# Patient Record
Sex: Female | Born: 1945
Health system: Southern US, Community
[De-identification: ages and names within clinical notes are randomized; demographics above are authoritative.]

## PROBLEM LIST (undated history)

## (undated) DIAGNOSIS — K219 Gastro-esophageal reflux disease without esophagitis: Secondary | ICD-10-CM

## (undated) DIAGNOSIS — N809 Endometriosis, unspecified: Secondary | ICD-10-CM

## (undated) DIAGNOSIS — I10 Essential (primary) hypertension: Secondary | ICD-10-CM

## (undated) DIAGNOSIS — K7689 Other specified diseases of liver: Secondary | ICD-10-CM

## (undated) DIAGNOSIS — K7460 Unspecified cirrhosis of liver: Secondary | ICD-10-CM

## (undated) DIAGNOSIS — I2510 Atherosclerotic heart disease of native coronary artery without angina pectoris: Secondary | ICD-10-CM

## (undated) DIAGNOSIS — R188 Other ascites: Secondary | ICD-10-CM

## (undated) DIAGNOSIS — K7030 Alcoholic cirrhosis of liver without ascites: Secondary | ICD-10-CM

## (undated) HISTORY — DX: Other ascites: R18.8

## (undated) HISTORY — PX: TONSILLECTOMY: SUR1361

## (undated) HISTORY — DX: Alcoholic cirrhosis of liver without ascites: K70.30

## (undated) HISTORY — PX: HEMORROIDECTOMY: SUR656

## (undated) HISTORY — PX: OTHER SURGICAL HISTORY: SHX169

## (undated) HISTORY — DX: Endometriosis, unspecified: N80.9

---

## 1987-04-08 HISTORY — PX: LAPAROSCOPY: SHX197

## 1987-04-08 HISTORY — PX: LAMINECTOMY: SHX219

## 1987-04-08 HISTORY — PX: OTHER SURGICAL HISTORY: SHX169

## 2004-07-23 DIAGNOSIS — E7800 Pure hypercholesterolemia, unspecified: Secondary | ICD-10-CM | POA: Insufficient documentation

## 2004-07-23 DIAGNOSIS — K769 Liver disease, unspecified: Secondary | ICD-10-CM | POA: Insufficient documentation

## 2004-07-23 DIAGNOSIS — F32A Depression, unspecified: Secondary | ICD-10-CM | POA: Insufficient documentation

## 2004-07-23 DIAGNOSIS — F329 Major depressive disorder, single episode, unspecified: Secondary | ICD-10-CM | POA: Insufficient documentation

## 2006-04-07 HISTORY — PX: HEMORROIDECTOMY: SUR656

## 2006-04-07 HISTORY — PX: CAROTID ENDARTERECTOMY: SUR193

## 2012-03-22 DIAGNOSIS — L719 Rosacea, unspecified: Secondary | ICD-10-CM | POA: Diagnosis not present

## 2012-04-08 DIAGNOSIS — Z130 Encounter for screening for diseases of the blood and blood-forming organs and certain disorders involving the immune mechanism: Secondary | ICD-10-CM | POA: Diagnosis not present

## 2012-04-08 DIAGNOSIS — Z136 Encounter for screening for cardiovascular disorders: Secondary | ICD-10-CM | POA: Diagnosis not present

## 2012-04-08 DIAGNOSIS — K2900 Acute gastritis without bleeding: Secondary | ICD-10-CM | POA: Diagnosis not present

## 2012-04-08 DIAGNOSIS — Z131 Encounter for screening for diabetes mellitus: Secondary | ICD-10-CM | POA: Diagnosis not present

## 2012-04-08 DIAGNOSIS — Z1329 Encounter for screening for other suspected endocrine disorder: Secondary | ICD-10-CM | POA: Diagnosis not present

## 2012-04-08 DIAGNOSIS — T887XXA Unspecified adverse effect of drug or medicament, initial encounter: Secondary | ICD-10-CM | POA: Diagnosis not present

## 2012-04-20 DIAGNOSIS — R7301 Impaired fasting glucose: Secondary | ICD-10-CM | POA: Diagnosis not present

## 2012-04-20 DIAGNOSIS — R7989 Other specified abnormal findings of blood chemistry: Secondary | ICD-10-CM | POA: Diagnosis not present

## 2012-04-20 DIAGNOSIS — R748 Abnormal levels of other serum enzymes: Secondary | ICD-10-CM | POA: Diagnosis not present

## 2012-04-20 DIAGNOSIS — R5381 Other malaise: Secondary | ICD-10-CM | POA: Diagnosis not present

## 2012-04-20 DIAGNOSIS — R5383 Other fatigue: Secondary | ICD-10-CM | POA: Diagnosis not present

## 2012-05-03 DIAGNOSIS — H25019 Cortical age-related cataract, unspecified eye: Secondary | ICD-10-CM | POA: Diagnosis not present

## 2012-12-02 DIAGNOSIS — Z23 Encounter for immunization: Secondary | ICD-10-CM | POA: Diagnosis not present

## 2012-12-14 DIAGNOSIS — D2370 Other benign neoplasm of skin of unspecified lower limb, including hip: Secondary | ICD-10-CM | POA: Diagnosis not present

## 2012-12-14 DIAGNOSIS — L98499 Non-pressure chronic ulcer of skin of other sites with unspecified severity: Secondary | ICD-10-CM | POA: Diagnosis not present

## 2012-12-21 DIAGNOSIS — L570 Actinic keratosis: Secondary | ICD-10-CM | POA: Diagnosis not present

## 2013-01-14 DIAGNOSIS — I700 Atherosclerosis of aorta: Secondary | ICD-10-CM | POA: Diagnosis not present

## 2013-01-14 DIAGNOSIS — R599 Enlarged lymph nodes, unspecified: Secondary | ICD-10-CM | POA: Diagnosis not present

## 2013-01-14 DIAGNOSIS — K7030 Alcoholic cirrhosis of liver without ascites: Secondary | ICD-10-CM | POA: Diagnosis not present

## 2013-01-14 DIAGNOSIS — N289 Disorder of kidney and ureter, unspecified: Secondary | ICD-10-CM | POA: Diagnosis not present

## 2013-01-14 DIAGNOSIS — K5710 Diverticulosis of small intestine without perforation or abscess without bleeding: Secondary | ICD-10-CM | POA: Diagnosis not present

## 2013-01-14 DIAGNOSIS — I998 Other disorder of circulatory system: Secondary | ICD-10-CM | POA: Diagnosis not present

## 2013-01-20 DIAGNOSIS — L570 Actinic keratosis: Secondary | ICD-10-CM | POA: Diagnosis not present

## 2013-03-09 DIAGNOSIS — L259 Unspecified contact dermatitis, unspecified cause: Secondary | ICD-10-CM | POA: Diagnosis not present

## 2013-03-09 DIAGNOSIS — L738 Other specified follicular disorders: Secondary | ICD-10-CM | POA: Diagnosis not present

## 2013-04-04 DIAGNOSIS — L259 Unspecified contact dermatitis, unspecified cause: Secondary | ICD-10-CM | POA: Diagnosis not present

## 2013-04-04 DIAGNOSIS — D1800 Hemangioma unspecified site: Secondary | ICD-10-CM | POA: Diagnosis not present

## 2013-06-29 DIAGNOSIS — H25019 Cortical age-related cataract, unspecified eye: Secondary | ICD-10-CM | POA: Diagnosis not present

## 2013-07-06 DIAGNOSIS — I781 Nevus, non-neoplastic: Secondary | ICD-10-CM | POA: Diagnosis not present

## 2013-07-06 DIAGNOSIS — L719 Rosacea, unspecified: Secondary | ICD-10-CM | POA: Diagnosis not present

## 2013-07-06 DIAGNOSIS — F430 Acute stress reaction: Secondary | ICD-10-CM | POA: Diagnosis not present

## 2013-08-04 DIAGNOSIS — Z9889 Other specified postprocedural states: Secondary | ICD-10-CM | POA: Diagnosis not present

## 2013-08-04 DIAGNOSIS — R002 Palpitations: Secondary | ICD-10-CM | POA: Diagnosis not present

## 2013-08-04 DIAGNOSIS — H2510 Age-related nuclear cataract, unspecified eye: Secondary | ICD-10-CM | POA: Diagnosis not present

## 2013-08-04 DIAGNOSIS — F17200 Nicotine dependence, unspecified, uncomplicated: Secondary | ICD-10-CM | POA: Diagnosis not present

## 2013-08-05 DIAGNOSIS — H2510 Age-related nuclear cataract, unspecified eye: Secondary | ICD-10-CM | POA: Diagnosis not present

## 2013-08-11 DIAGNOSIS — H25019 Cortical age-related cataract, unspecified eye: Secondary | ICD-10-CM | POA: Diagnosis not present

## 2013-08-11 DIAGNOSIS — H2510 Age-related nuclear cataract, unspecified eye: Secondary | ICD-10-CM | POA: Diagnosis not present

## 2013-08-11 DIAGNOSIS — R002 Palpitations: Secondary | ICD-10-CM | POA: Diagnosis not present

## 2013-08-11 DIAGNOSIS — H4320 Crystalline deposits in vitreous body, unspecified eye: Secondary | ICD-10-CM | POA: Diagnosis not present

## 2013-08-11 DIAGNOSIS — Z79899 Other long term (current) drug therapy: Secondary | ICD-10-CM | POA: Diagnosis not present

## 2013-08-11 DIAGNOSIS — Z83518 Family history of other specified eye disorder: Secondary | ICD-10-CM | POA: Diagnosis not present

## 2013-08-11 DIAGNOSIS — Z961 Presence of intraocular lens: Secondary | ICD-10-CM | POA: Diagnosis not present

## 2013-08-11 DIAGNOSIS — F17200 Nicotine dependence, unspecified, uncomplicated: Secondary | ICD-10-CM | POA: Diagnosis not present

## 2013-08-16 DIAGNOSIS — L719 Rosacea, unspecified: Secondary | ICD-10-CM | POA: Diagnosis not present

## 2013-08-23 DIAGNOSIS — K7460 Unspecified cirrhosis of liver: Secondary | ICD-10-CM | POA: Diagnosis not present

## 2013-08-26 DIAGNOSIS — K7460 Unspecified cirrhosis of liver: Secondary | ICD-10-CM | POA: Diagnosis not present

## 2013-08-26 DIAGNOSIS — K769 Liver disease, unspecified: Secondary | ICD-10-CM | POA: Diagnosis not present

## 2013-08-30 DIAGNOSIS — R002 Palpitations: Secondary | ICD-10-CM | POA: Diagnosis not present

## 2013-11-03 DIAGNOSIS — I059 Rheumatic mitral valve disease, unspecified: Secondary | ICD-10-CM | POA: Diagnosis not present

## 2013-11-03 DIAGNOSIS — R1110 Vomiting, unspecified: Secondary | ICD-10-CM | POA: Diagnosis not present

## 2013-11-03 DIAGNOSIS — E860 Dehydration: Secondary | ICD-10-CM | POA: Diagnosis not present

## 2013-11-03 DIAGNOSIS — Z888 Allergy status to other drugs, medicaments and biological substances status: Secondary | ICD-10-CM | POA: Diagnosis not present

## 2013-11-03 DIAGNOSIS — R197 Diarrhea, unspecified: Secondary | ICD-10-CM | POA: Diagnosis not present

## 2013-11-03 DIAGNOSIS — Z87891 Personal history of nicotine dependence: Secondary | ICD-10-CM | POA: Diagnosis not present

## 2013-11-30 DIAGNOSIS — K7460 Unspecified cirrhosis of liver: Secondary | ICD-10-CM | POA: Insufficient documentation

## 2013-11-30 DIAGNOSIS — K7030 Alcoholic cirrhosis of liver without ascites: Secondary | ICD-10-CM | POA: Diagnosis not present

## 2013-12-02 DIAGNOSIS — R197 Diarrhea, unspecified: Secondary | ICD-10-CM | POA: Diagnosis not present

## 2013-12-02 DIAGNOSIS — I10 Essential (primary) hypertension: Secondary | ICD-10-CM | POA: Diagnosis not present

## 2013-12-02 DIAGNOSIS — I8510 Secondary esophageal varices without bleeding: Secondary | ICD-10-CM | POA: Diagnosis not present

## 2013-12-02 DIAGNOSIS — J449 Chronic obstructive pulmonary disease, unspecified: Secondary | ICD-10-CM | POA: Diagnosis not present

## 2013-12-02 DIAGNOSIS — K449 Diaphragmatic hernia without obstruction or gangrene: Secondary | ICD-10-CM | POA: Diagnosis not present

## 2013-12-02 DIAGNOSIS — K7460 Unspecified cirrhosis of liver: Secondary | ICD-10-CM | POA: Diagnosis not present

## 2013-12-02 DIAGNOSIS — R5383 Other fatigue: Secondary | ICD-10-CM | POA: Diagnosis not present

## 2013-12-02 DIAGNOSIS — I498 Other specified cardiac arrhythmias: Secondary | ICD-10-CM | POA: Diagnosis not present

## 2013-12-02 DIAGNOSIS — K766 Portal hypertension: Secondary | ICD-10-CM | POA: Diagnosis not present

## 2013-12-02 DIAGNOSIS — R5381 Other malaise: Secondary | ICD-10-CM | POA: Diagnosis not present

## 2013-12-02 DIAGNOSIS — K319 Disease of stomach and duodenum, unspecified: Secondary | ICD-10-CM | POA: Diagnosis not present

## 2013-12-02 DIAGNOSIS — R110 Nausea: Secondary | ICD-10-CM | POA: Diagnosis not present

## 2013-12-02 DIAGNOSIS — K2100 Gastro-esophageal reflux disease with esophagitis, without bleeding: Secondary | ICD-10-CM | POA: Diagnosis not present

## 2013-12-02 DIAGNOSIS — R188 Other ascites: Secondary | ICD-10-CM | POA: Diagnosis not present

## 2013-12-02 DIAGNOSIS — K7030 Alcoholic cirrhosis of liver without ascites: Secondary | ICD-10-CM | POA: Diagnosis not present

## 2013-12-02 DIAGNOSIS — K921 Melena: Secondary | ICD-10-CM | POA: Diagnosis not present

## 2013-12-02 DIAGNOSIS — K828 Other specified diseases of gallbladder: Secondary | ICD-10-CM | POA: Diagnosis not present

## 2013-12-02 DIAGNOSIS — E871 Hypo-osmolality and hyponatremia: Secondary | ICD-10-CM | POA: Diagnosis not present

## 2013-12-02 DIAGNOSIS — Z66 Do not resuscitate: Secondary | ICD-10-CM | POA: Diagnosis not present

## 2013-12-02 DIAGNOSIS — F17200 Nicotine dependence, unspecified, uncomplicated: Secondary | ICD-10-CM | POA: Diagnosis not present

## 2013-12-03 DIAGNOSIS — K766 Portal hypertension: Secondary | ICD-10-CM | POA: Diagnosis not present

## 2013-12-03 DIAGNOSIS — K2100 Gastro-esophageal reflux disease with esophagitis, without bleeding: Secondary | ICD-10-CM | POA: Diagnosis not present

## 2013-12-03 DIAGNOSIS — I8510 Secondary esophageal varices without bleeding: Secondary | ICD-10-CM | POA: Diagnosis not present

## 2013-12-03 DIAGNOSIS — K449 Diaphragmatic hernia without obstruction or gangrene: Secondary | ICD-10-CM | POA: Diagnosis not present

## 2013-12-03 DIAGNOSIS — K7030 Alcoholic cirrhosis of liver without ascites: Secondary | ICD-10-CM | POA: Diagnosis not present

## 2013-12-03 DIAGNOSIS — K921 Melena: Secondary | ICD-10-CM | POA: Diagnosis not present

## 2013-12-03 DIAGNOSIS — K319 Disease of stomach and duodenum, unspecified: Secondary | ICD-10-CM | POA: Diagnosis not present

## 2013-12-03 DIAGNOSIS — Z66 Do not resuscitate: Secondary | ICD-10-CM | POA: Diagnosis not present

## 2013-12-04 DIAGNOSIS — K449 Diaphragmatic hernia without obstruction or gangrene: Secondary | ICD-10-CM | POA: Diagnosis not present

## 2013-12-04 DIAGNOSIS — K921 Melena: Secondary | ICD-10-CM | POA: Diagnosis not present

## 2013-12-04 DIAGNOSIS — K7030 Alcoholic cirrhosis of liver without ascites: Secondary | ICD-10-CM | POA: Diagnosis not present

## 2013-12-04 DIAGNOSIS — K2100 Gastro-esophageal reflux disease with esophagitis, without bleeding: Secondary | ICD-10-CM | POA: Diagnosis not present

## 2013-12-04 DIAGNOSIS — I8511 Secondary esophageal varices with bleeding: Secondary | ICD-10-CM | POA: Diagnosis not present

## 2013-12-04 DIAGNOSIS — K766 Portal hypertension: Secondary | ICD-10-CM | POA: Diagnosis not present

## 2013-12-04 DIAGNOSIS — Z66 Do not resuscitate: Secondary | ICD-10-CM | POA: Diagnosis not present

## 2013-12-04 DIAGNOSIS — I8510 Secondary esophageal varices without bleeding: Secondary | ICD-10-CM | POA: Diagnosis not present

## 2013-12-04 DIAGNOSIS — E871 Hypo-osmolality and hyponatremia: Secondary | ICD-10-CM | POA: Diagnosis not present

## 2013-12-04 DIAGNOSIS — K319 Disease of stomach and duodenum, unspecified: Secondary | ICD-10-CM | POA: Diagnosis not present

## 2013-12-05 DIAGNOSIS — I8510 Secondary esophageal varices without bleeding: Secondary | ICD-10-CM | POA: Diagnosis not present

## 2013-12-05 DIAGNOSIS — Z66 Do not resuscitate: Secondary | ICD-10-CM | POA: Diagnosis not present

## 2013-12-05 DIAGNOSIS — K449 Diaphragmatic hernia without obstruction or gangrene: Secondary | ICD-10-CM | POA: Diagnosis not present

## 2013-12-05 DIAGNOSIS — K921 Melena: Secondary | ICD-10-CM | POA: Diagnosis not present

## 2013-12-05 DIAGNOSIS — I8511 Secondary esophageal varices with bleeding: Secondary | ICD-10-CM | POA: Diagnosis not present

## 2013-12-05 DIAGNOSIS — K2100 Gastro-esophageal reflux disease with esophagitis, without bleeding: Secondary | ICD-10-CM | POA: Diagnosis not present

## 2013-12-05 DIAGNOSIS — E871 Hypo-osmolality and hyponatremia: Secondary | ICD-10-CM | POA: Diagnosis not present

## 2013-12-05 DIAGNOSIS — K319 Disease of stomach and duodenum, unspecified: Secondary | ICD-10-CM | POA: Diagnosis not present

## 2013-12-05 DIAGNOSIS — K7030 Alcoholic cirrhosis of liver without ascites: Secondary | ICD-10-CM | POA: Diagnosis not present

## 2013-12-05 DIAGNOSIS — K766 Portal hypertension: Secondary | ICD-10-CM | POA: Diagnosis not present

## 2013-12-19 DIAGNOSIS — R7989 Other specified abnormal findings of blood chemistry: Secondary | ICD-10-CM | POA: Diagnosis not present

## 2013-12-19 DIAGNOSIS — N289 Disorder of kidney and ureter, unspecified: Secondary | ICD-10-CM | POA: Diagnosis not present

## 2013-12-19 DIAGNOSIS — D649 Anemia, unspecified: Secondary | ICD-10-CM | POA: Diagnosis not present

## 2013-12-31 DIAGNOSIS — Z23 Encounter for immunization: Secondary | ICD-10-CM | POA: Diagnosis not present

## 2014-02-02 DIAGNOSIS — I8500 Esophageal varices without bleeding: Secondary | ICD-10-CM | POA: Diagnosis not present

## 2014-02-02 DIAGNOSIS — Z87891 Personal history of nicotine dependence: Secondary | ICD-10-CM | POA: Diagnosis not present

## 2014-02-02 DIAGNOSIS — Z8679 Personal history of other diseases of the circulatory system: Secondary | ICD-10-CM | POA: Insufficient documentation

## 2014-02-02 DIAGNOSIS — R0602 Shortness of breath: Secondary | ICD-10-CM | POA: Insufficient documentation

## 2014-02-02 DIAGNOSIS — Z8719 Personal history of other diseases of the digestive system: Secondary | ICD-10-CM | POA: Diagnosis not present

## 2014-02-02 DIAGNOSIS — R188 Other ascites: Secondary | ICD-10-CM | POA: Insufficient documentation

## 2014-02-03 DIAGNOSIS — R188 Other ascites: Secondary | ICD-10-CM | POA: Diagnosis not present

## 2014-02-03 DIAGNOSIS — K7469 Other cirrhosis of liver: Secondary | ICD-10-CM | POA: Diagnosis not present

## 2014-02-07 DIAGNOSIS — R188 Other ascites: Secondary | ICD-10-CM | POA: Diagnosis not present

## 2014-02-07 DIAGNOSIS — R0602 Shortness of breath: Secondary | ICD-10-CM | POA: Diagnosis not present

## 2014-02-08 DIAGNOSIS — K745 Biliary cirrhosis, unspecified: Secondary | ICD-10-CM | POA: Insufficient documentation

## 2014-02-08 DIAGNOSIS — F1011 Alcohol abuse, in remission: Secondary | ICD-10-CM | POA: Insufficient documentation

## 2014-02-17 DIAGNOSIS — E876 Hypokalemia: Secondary | ICD-10-CM | POA: Insufficient documentation

## 2014-02-17 DIAGNOSIS — T50905A Adverse effect of unspecified drugs, medicaments and biological substances, initial encounter: Secondary | ICD-10-CM | POA: Insufficient documentation

## 2014-02-20 DIAGNOSIS — T502X5A Adverse effect of carbonic-anhydrase inhibitors, benzothiadiazides and other diuretics, initial encounter: Secondary | ICD-10-CM | POA: Diagnosis not present

## 2014-02-20 DIAGNOSIS — E876 Hypokalemia: Secondary | ICD-10-CM | POA: Diagnosis not present

## 2014-02-24 DIAGNOSIS — E876 Hypokalemia: Secondary | ICD-10-CM | POA: Diagnosis not present

## 2014-02-24 DIAGNOSIS — T502X5A Adverse effect of carbonic-anhydrase inhibitors, benzothiadiazides and other diuretics, initial encounter: Secondary | ICD-10-CM | POA: Diagnosis not present

## 2014-02-28 DIAGNOSIS — R0602 Shortness of breath: Secondary | ICD-10-CM | POA: Diagnosis not present

## 2014-02-28 DIAGNOSIS — R188 Other ascites: Secondary | ICD-10-CM | POA: Diagnosis not present

## 2014-03-07 DIAGNOSIS — K7031 Alcoholic cirrhosis of liver with ascites: Secondary | ICD-10-CM | POA: Diagnosis not present

## 2014-03-09 DIAGNOSIS — K7031 Alcoholic cirrhosis of liver with ascites: Secondary | ICD-10-CM | POA: Diagnosis not present

## 2014-03-22 DIAGNOSIS — K920 Hematemesis: Secondary | ICD-10-CM | POA: Diagnosis not present

## 2014-03-22 DIAGNOSIS — L299 Pruritus, unspecified: Secondary | ICD-10-CM | POA: Diagnosis not present

## 2014-03-22 DIAGNOSIS — K228 Other specified diseases of esophagus: Secondary | ICD-10-CM | POA: Diagnosis not present

## 2014-03-22 DIAGNOSIS — K7290 Hepatic failure, unspecified without coma: Secondary | ICD-10-CM | POA: Diagnosis present

## 2014-03-22 DIAGNOSIS — E871 Hypo-osmolality and hyponatremia: Secondary | ICD-10-CM | POA: Diagnosis not present

## 2014-03-22 DIAGNOSIS — I959 Hypotension, unspecified: Secondary | ICD-10-CM | POA: Diagnosis not present

## 2014-03-22 DIAGNOSIS — R188 Other ascites: Secondary | ICD-10-CM | POA: Diagnosis not present

## 2014-03-22 DIAGNOSIS — D62 Acute posthemorrhagic anemia: Secondary | ICD-10-CM | POA: Diagnosis not present

## 2014-03-22 DIAGNOSIS — K7031 Alcoholic cirrhosis of liver with ascites: Secondary | ICD-10-CM | POA: Diagnosis not present

## 2014-03-22 DIAGNOSIS — K922 Gastrointestinal hemorrhage, unspecified: Secondary | ICD-10-CM | POA: Diagnosis not present

## 2014-03-22 DIAGNOSIS — D649 Anemia, unspecified: Secondary | ICD-10-CM | POA: Diagnosis not present

## 2014-03-22 DIAGNOSIS — K7460 Unspecified cirrhosis of liver: Secondary | ICD-10-CM | POA: Diagnosis not present

## 2014-03-22 DIAGNOSIS — I8500 Esophageal varices without bleeding: Secondary | ICD-10-CM | POA: Diagnosis not present

## 2014-03-22 DIAGNOSIS — K766 Portal hypertension: Secondary | ICD-10-CM | POA: Diagnosis not present

## 2014-03-22 DIAGNOSIS — I499 Cardiac arrhythmia, unspecified: Secondary | ICD-10-CM | POA: Diagnosis not present

## 2014-03-22 DIAGNOSIS — Z66 Do not resuscitate: Secondary | ICD-10-CM | POA: Diagnosis present

## 2014-03-22 DIAGNOSIS — K3189 Other diseases of stomach and duodenum: Secondary | ICD-10-CM | POA: Diagnosis not present

## 2014-03-22 DIAGNOSIS — R531 Weakness: Secondary | ICD-10-CM | POA: Diagnosis not present

## 2014-03-22 DIAGNOSIS — R110 Nausea: Secondary | ICD-10-CM | POA: Diagnosis not present

## 2014-03-22 DIAGNOSIS — R404 Transient alteration of awareness: Secondary | ICD-10-CM | POA: Diagnosis not present

## 2014-03-22 DIAGNOSIS — K745 Biliary cirrhosis, unspecified: Secondary | ICD-10-CM | POA: Diagnosis present

## 2014-03-22 DIAGNOSIS — R112 Nausea with vomiting, unspecified: Secondary | ICD-10-CM | POA: Diagnosis not present

## 2014-03-22 DIAGNOSIS — I8501 Esophageal varices with bleeding: Secondary | ICD-10-CM | POA: Diagnosis not present

## 2014-03-23 DIAGNOSIS — R112 Nausea with vomiting, unspecified: Secondary | ICD-10-CM | POA: Diagnosis not present

## 2014-03-23 DIAGNOSIS — I499 Cardiac arrhythmia, unspecified: Secondary | ICD-10-CM | POA: Diagnosis not present

## 2014-03-23 DIAGNOSIS — K228 Other specified diseases of esophagus: Secondary | ICD-10-CM | POA: Diagnosis not present

## 2014-03-23 DIAGNOSIS — E871 Hypo-osmolality and hyponatremia: Secondary | ICD-10-CM | POA: Insufficient documentation

## 2014-03-23 DIAGNOSIS — K920 Hematemesis: Secondary | ICD-10-CM | POA: Diagnosis not present

## 2014-03-23 DIAGNOSIS — R188 Other ascites: Secondary | ICD-10-CM | POA: Diagnosis not present

## 2014-03-23 DIAGNOSIS — D62 Acute posthemorrhagic anemia: Secondary | ICD-10-CM | POA: Insufficient documentation

## 2014-03-23 DIAGNOSIS — K7031 Alcoholic cirrhosis of liver with ascites: Secondary | ICD-10-CM | POA: Diagnosis not present

## 2014-03-30 DIAGNOSIS — R188 Other ascites: Secondary | ICD-10-CM | POA: Diagnosis not present

## 2014-04-03 DIAGNOSIS — K7031 Alcoholic cirrhosis of liver with ascites: Secondary | ICD-10-CM | POA: Diagnosis not present

## 2014-04-03 DIAGNOSIS — K745 Biliary cirrhosis, unspecified: Secondary | ICD-10-CM | POA: Diagnosis not present

## 2014-04-07 HISTORY — PX: OTHER SURGICAL HISTORY: SHX169

## 2014-04-14 ENCOUNTER — Other Ambulatory Visit (HOSPITAL_COMMUNITY): Payer: Self-pay | Admitting: Gastroenterology

## 2014-04-14 DIAGNOSIS — R188 Other ascites: Secondary | ICD-10-CM

## 2014-04-14 DIAGNOSIS — K7460 Unspecified cirrhosis of liver: Secondary | ICD-10-CM | POA: Diagnosis not present

## 2014-04-14 DIAGNOSIS — K7469 Other cirrhosis of liver: Secondary | ICD-10-CM

## 2014-04-17 ENCOUNTER — Encounter (HOSPITAL_COMMUNITY): Payer: Self-pay

## 2014-04-17 ENCOUNTER — Ambulatory Visit (HOSPITAL_COMMUNITY)
Admission: RE | Admit: 2014-04-17 | Discharge: 2014-04-17 | Disposition: A | Discharge status: Home / Self Care | Payer: Medicare Other | Source: Ambulatory Visit | Attending: Gastroenterology | Admitting: Gastroenterology

## 2014-04-17 DIAGNOSIS — K7460 Unspecified cirrhosis of liver: Secondary | ICD-10-CM | POA: Insufficient documentation

## 2014-04-17 DIAGNOSIS — E876 Hypokalemia: Secondary | ICD-10-CM | POA: Diagnosis not present

## 2014-04-17 DIAGNOSIS — K7469 Other cirrhosis of liver: Secondary | ICD-10-CM

## 2014-04-17 DIAGNOSIS — R188 Other ascites: Secondary | ICD-10-CM | POA: Insufficient documentation

## 2014-04-17 DIAGNOSIS — R8569 Abnormal cytological findings in specimens from other digestive organs and abdominal cavity: Secondary | ICD-10-CM | POA: Diagnosis not present

## 2014-04-17 HISTORY — DX: Unspecified cirrhosis of liver: K74.60

## 2014-04-17 HISTORY — DX: Gastro-esophageal reflux disease without esophagitis: K21.9

## 2014-04-17 LAB — BODY FLUID CELL COUNT WITH DIFFERENTIAL
Lymphs, Fluid: 37 %
Monocyte-Macrophage-Serous Fluid: 61 % (ref 50–90)
Neutrophil Count, Fluid: 2 % (ref 0–25)
Total Nucleated Cell Count, Fluid: 141 cu mm (ref 0–1000)

## 2014-04-17 LAB — ALBUMIN, FLUID (OTHER): Albumin, Fluid: <1 g/dL

## 2014-04-17 MED ORDER — ALBUMIN HUMAN 25 % IV SOLN
50.0000 g | Freq: Once | INTRAVENOUS | Status: AC
  Administered 2014-04-17: 50 g via INTRAVENOUS
  Filled 2014-04-17: qty 200

## 2014-04-17 MED ORDER — SODIUM CHLORIDE 0.9 % IV SOLN
250.0000 mL | Freq: Once | INTRAVENOUS | Status: AC
  Administered 2014-04-17: 250 mL via INTRAVENOUS

## 2014-04-17 NOTE — Discharge Instructions (Signed)
Albumin  What is this medicine? ALBUMIN (al BYOO min) is used to treat or prevent shock following serious injury, bleeding, surgery, or burns by increasing the volume of blood plasma. This medicine can also replace low blood protein. This medicine may be used for other purposes; ask your health care provider or pharmacist if you have questions. COMMON BRAND NAME(S): Albuked, Albumarc, Albuminar, Albutein, Buminate, Flexbumin, Kedbumin, Macrotec, Plasbumin What should I tell my health care provider before I take this medicine? They need to know if you have any of the following conditions: -anemia -heart disease -kidney disease -an unusual or allergic reaction to albumin, other medicines, foods, dyes, or preservatives -pregnant or trying to get pregnant -breast-feeding How should I use this medicine? This medicine is for infusion into a vein. It is given by a health-care professional in a hospital or clinic. Talk to your pediatrician regarding the use of this medicine in children. While this drug may be prescribed for selected conditions, precautions do apply. Overdosage: If you think you have taken too much of this medicine contact a poison control center or emergency room at once. NOTE: This medicine is only for you. Do not share this medicine with others. What if I miss a dose? This does not apply. What may interact with this medicine? Interactions are not expected. This list may not describe all possible interactions. Give your health care provider a list of all the medicines, herbs, non-prescription drugs, or dietary supplements you use. Also tell them if you smoke, drink alcohol, or use illegal drugs. Some items may interact with your medicine. What should I watch for while using this medicine? Your condition will be closely monitored while you receive this medicine. Some products are derived from human plasma, and there is a small risk that these products may contain certain types of  virus or bacteria. All products are processed to kill most viruses and bacteria. If you have questions concerning the risk of infections, discuss them with your doctor or health care professional. What side effects may I notice from receiving this medicine? Side effects that you should report to your doctor or health care professional as soon as possible: -allergic reactions like skin rash, itching or hives, swelling of the face, lips, or tongue -breathing problems -changes in heartbeat -fever, chills -pain, redness or swelling at the injection site -signs of viral infection including fever, drowsiness, chills, runny nose followed in about 2 weeks by a rash and joint pain -tightness in the chest Side effects that usually do not require medical attention (report to your doctor or health care professional if they continue or are bothersome): -increased salivation -nausea, vomiting This list may not describe all possible side effects. Call your doctor for medical advice about side effects. You may report side effects to FDA at 1-800-FDA-1088. Where should I keep my medicine? This does not apply. You will not be given this medicine to store at home. NOTE: This sheet is a summary. It may not cover all possible information. If you have questions about this medicine, talk to your doctor, pharmacist, or health care provider.  2015, Elsevier/Gold Standard. (2007-06-17 10:18:55)

## 2014-04-17 NOTE — Procedures (Signed)
US guided diagnostic/therapeutic paracentesis performed yielding 6.6 liters yellow fluid. A portion of the fluid was sent to the lab for preordered studies. No immediate complications. The pt will receive IV albumin postprocedure.

## 2014-04-20 LAB — BODY FLUID CULTURE
Culture: NO GROWTH
Gram Stain: NONE SEEN

## 2014-05-01 ENCOUNTER — Ambulatory Visit (HOSPITAL_COMMUNITY)
Admission: RE | Admit: 2014-05-01 | Discharge: 2014-05-01 | Disposition: A | Discharge status: Home / Self Care | Payer: Medicare Other | Source: Ambulatory Visit | Attending: Gastroenterology | Admitting: Gastroenterology

## 2014-05-01 ENCOUNTER — Other Ambulatory Visit (HOSPITAL_COMMUNITY): Payer: Self-pay | Admitting: Gastroenterology

## 2014-05-01 ENCOUNTER — Encounter (HOSPITAL_COMMUNITY): Payer: Self-pay

## 2014-05-01 DIAGNOSIS — R188 Other ascites: Secondary | ICD-10-CM | POA: Diagnosis not present

## 2014-05-01 DIAGNOSIS — E876 Hypokalemia: Secondary | ICD-10-CM | POA: Diagnosis not present

## 2014-05-01 DIAGNOSIS — K7031 Alcoholic cirrhosis of liver with ascites: Secondary | ICD-10-CM | POA: Diagnosis not present

## 2014-05-01 MED ORDER — ALBUMIN HUMAN 25 % IV SOLN
50.0000 g | Freq: Once | INTRAVENOUS | Status: AC
Start: 2014-05-01 — End: 2014-05-01
  Administered 2014-05-01: 50 g via INTRAVENOUS
  Filled 2014-05-01: qty 200

## 2014-05-01 MED ORDER — SODIUM CHLORIDE 0.9 % IV SOLN
250.0000 mL | INTRAVENOUS | Status: DC
  Administered 2014-05-01: 250 mL via INTRAVENOUS

## 2014-05-01 NOTE — Procedures (Signed)
Successful US guided paracentesis from RLQ.  Yielded 6 liters of serous fluid.  No immediate complications.  Pt tolerated well.  Post procedure IV Albumin was ordered.  Specimen was not sent for labs.  Tsosie Billing D PA-C 05/01/2014 12:46 PM

## 2014-05-09 ENCOUNTER — Other Ambulatory Visit (HOSPITAL_COMMUNITY): Payer: Self-pay | Admitting: Gastroenterology

## 2014-05-09 DIAGNOSIS — K7460 Unspecified cirrhosis of liver: Secondary | ICD-10-CM | POA: Diagnosis not present

## 2014-05-12 ENCOUNTER — Encounter (HOSPITAL_COMMUNITY): Payer: Self-pay

## 2014-05-12 ENCOUNTER — Encounter (HOSPITAL_COMMUNITY)
Admission: RE | Admit: 2014-05-12 | Discharge: 2014-05-12 | Disposition: A | Discharge status: Home / Self Care | Payer: Medicare Other | Source: Ambulatory Visit | Attending: Gastroenterology | Admitting: Gastroenterology

## 2014-05-12 ENCOUNTER — Ambulatory Visit (HOSPITAL_COMMUNITY)
Admission: RE | Admit: 2014-05-12 | Discharge: 2014-05-12 | Disposition: A | Discharge status: Home / Self Care | Payer: Medicare Other | Source: Ambulatory Visit | Attending: Gastroenterology | Admitting: Gastroenterology

## 2014-05-12 DIAGNOSIS — K7460 Unspecified cirrhosis of liver: Secondary | ICD-10-CM

## 2014-05-12 DIAGNOSIS — R188 Other ascites: Secondary | ICD-10-CM | POA: Diagnosis not present

## 2014-05-12 MED ORDER — SODIUM CHLORIDE 0.9 % IV SOLN
Freq: Once | INTRAVENOUS | Status: AC
  Administered 2014-05-12: 250 mL via INTRAVENOUS

## 2014-05-12 MED ORDER — ALBUMIN HUMAN 25 % IV SOLN
50.0000 g | Freq: Once | INTRAVENOUS | Status: AC
  Administered 2014-05-12: 50 g via INTRAVENOUS
  Filled 2014-05-12: qty 200

## 2014-05-12 NOTE — Discharge Instructions (Signed)
Paracentesis °Paracentesis is a procedure used to remove excess fluid from the belly (abdomen). Excess fluid in the belly is called ascites. Excess fluid can be the result of certain conditions, such as infection, inflammation, abdominal injury, heart failure, chronic scarring of the liver (cirrhosis), or cancer. The excess fluid is removed using a needle inserted through the skin and tissue into the abdomen.  °A paracentesis may be done to: °· Determine the cause of the excess fluid through examination of the fluid. °· Relieve symptoms of shortness of breath or pain caused by the excess fluid. °· Determine presence of bleeding after an abdominal injury. °LET YOUR CAREGIVERS KNOW ABOUT: °· Allergies. °· Medications taken including herbs, eye drops, over-the-counter medications, and creams. °· Use of steroids (by mouth or creams). °· Previous problems with anesthetics or numbing medicine. °· Possibility of pregnancy, if this applies. °· History of blood clots (thrombophlebitis). °· History of bleeding or blood problems. °· Previous surgery. °· Other health problems. °RISKS AND COMPLICATIONS °· Injury to an abdominal organ, such as the bowel (large intestine), liver, spleen, or bladder. °· Possible infection. °· Bleeding. °· Low blood pressure (hypotension). °BEFORE THE PROCEDURE °This is a procedure that can be done as an outpatient. Confirm the time that you need to arrive for your procedure. A blood sample may be done to determine your blood clotting time. The presence of a severe bleeding disorder (coagulopathy) which cannot be promptly corrected may make this procedure inadvisable. You may be asked to urinate. °PROCEDURE °The procedure will take about 30 minutes. This time will vary depending on the amount of fluid that is removed. You may be asked to lie on your back with your head elevated. An area on your abdomen will be cleansed. A numbing medicine may then be injected (local anesthesia) into the skin and  tissue. A needle is inserted through your abdominal skin and tissues until it is positioned in your abdomen. You may feel pressure or slight pain as the needle is positioned into the abdomen. Fluid is removed from the abdomen through the needle. Tell your caregiver if you feel dizzy or lightheaded. The needle is withdrawn once the desired amount of fluid has been removed. A sample of the fluid may be sent for examination.  °AFTER THE PROCEDURE °Your recovery will be assessed and monitored. If there are no problems, as an outpatient, you should be able to go home shortly after the procedure. There may be a very limited amount of clear fluid draining from the needle insertion site over the next 2 days. Confirm with your caregiver as to the expected amount of drainage. °Obtaining the Test Results °It is your responsibility to obtain your test results. Do not assume everything is normal if you have not heard from your caregiver or the medical facility. It is important for you to follow up on all of your test results. °HOME CARE INSTRUCTIONS  °· You may resume normal diet and activities as directed or allowed. °· Only take over-the-counter or prescription medicines for pain, discomfort, or fever as directed by your caregiver. °SEEK IMMEDIATE MEDICAL CARE IF: °· You develop shortness of breath or chest pain. °· You develop increasing pain, discomfort, or swelling in your abdomen. °· You develop new drainage or pus coming from site where fluid was removed. °· You develop swelling or increased redness from site where fluid was removed. °· You develop an unexplained temperature of 102° F (38.9° C) or above. °Document Released: 10/07/2004 Document Revised: 06/16/2011 Document   Reviewed: 11/13/2008 °ExitCare® Patient Information ©2015 [employer]. This information is not intended to replace advice given to you by your health care provider. Make sure you discuss any questions you have with your health care provider °Albumin  injection °What is this medicine? °ALBUMIN (al BYOO min) is used to treat or prevent shock following serious injury, bleeding, surgery, or burns by increasing the volume of blood plasma. This medicine can also replace low blood protein. °This medicine may be used for other purposes; ask your health care provider or pharmacist if you have questions. °COMMON BRAND NAME(S): Albuked, Albumarc, Albuminar, Albutein, Buminate, Flexbumin, Kedbumin, Macrotec, Plasbumin °What should I tell my health care provider before I take this medicine? °They need to know if you have any of the following conditions: °-anemia °-heart disease °-kidney disease °-an unusual or allergic reaction to albumin, other medicines, foods, dyes, or preservatives °-pregnant or trying to get pregnant °-breast-feeding °How should I use this medicine? °This medicine is for infusion into a vein. It is given by a health-care professional in a hospital or clinic. °Talk to your pediatrician regarding the use of this medicine in children. While this drug may be prescribed for selected conditions, precautions do apply. °Overdosage: If you think you have taken too much of this medicine contact a poison control center or emergency room at once. °NOTE: This medicine is only for you. Do not share this medicine with others. °What if I miss a dose? °This does not apply. °What may interact with this medicine? °Interactions are not expected. °This list may not describe all possible interactions. Give your health care provider a list of all the medicines, herbs, non-prescription drugs, or dietary supplements you use. Also tell them if you smoke, drink alcohol, or use illegal drugs. Some items may interact with your medicine. °What should I watch for while using this medicine? °Your condition will be closely monitored while you receive this medicine. °Some products are derived from human plasma, and there is a small risk that these products may contain certain types of  virus or bacteria. All products are processed to kill most viruses and bacteria. If you have questions concerning the risk of infections, discuss them with your doctor or health care professional. °What side effects may I notice from receiving this medicine? °Side effects that you should report to your doctor or health care professional as soon as possible: °-allergic reactions like skin rash, itching or hives, swelling of the face, lips, or tongue °-breathing problems °-changes in heartbeat °-fever, chills °-pain, redness or swelling at the injection site °-signs of viral infection including fever, drowsiness, chills, runny nose followed in about 2 weeks by a rash and joint pain °-tightness in the chest °Side effects that usually do not require medical attention (report to your doctor or health care professional if they continue or are bothersome): °-increased salivation °-nausea, vomiting °This list may not describe all possible side effects. Call your doctor for medical advice about side effects. You may report side effects to FDA at 1-800-FDA-1088. °Where should I keep my medicine? °This does not apply. You will not be given this medicine to store at home. °NOTE: This sheet is a summary. It may not cover all possible information. If you have questions about this medicine, talk to your doctor, pharmacist, or health care provider. °© 2015, Elsevier/Gold Standard. (2007-06-17 10:18:55) ° °

## 2014-05-12 NOTE — Procedures (Signed)
US guided therapeutic paracentesis performed yielding 5.8 liters slightly turbid, light yellow fluid. No immediate complications. The pt will receive IV albumin postprocedure.

## 2014-05-31 ENCOUNTER — Other Ambulatory Visit (HOSPITAL_COMMUNITY): Payer: Self-pay | Admitting: Gastroenterology

## 2014-05-31 DIAGNOSIS — R188 Other ascites: Secondary | ICD-10-CM

## 2014-06-06 ENCOUNTER — Encounter (HOSPITAL_COMMUNITY)
Admission: RE | Admit: 2014-06-06 | Discharge: 2014-06-06 | Disposition: A | Discharge status: Home / Self Care | Payer: Medicare Other | Source: Ambulatory Visit | Attending: Gastroenterology | Admitting: Gastroenterology

## 2014-06-06 ENCOUNTER — Ambulatory Visit (HOSPITAL_COMMUNITY)
Admission: RE | Admit: 2014-06-06 | Discharge: 2014-06-06 | Disposition: A | Discharge status: Home / Self Care | Payer: Medicare Other | Source: Ambulatory Visit | Attending: Gastroenterology | Admitting: Gastroenterology

## 2014-06-06 ENCOUNTER — Encounter (HOSPITAL_COMMUNITY): Payer: Self-pay

## 2014-06-06 DIAGNOSIS — K7460 Unspecified cirrhosis of liver: Secondary | ICD-10-CM | POA: Insufficient documentation

## 2014-06-06 DIAGNOSIS — R188 Other ascites: Secondary | ICD-10-CM | POA: Diagnosis not present

## 2014-06-06 MED ORDER — SODIUM CHLORIDE 0.9 % IV SOLN
Freq: Once | INTRAVENOUS | Status: AC
  Administered 2014-06-06: 13:00:00 via INTRAVENOUS

## 2014-06-06 MED ORDER — ALBUMIN HUMAN 25 % IV SOLN
50.0000 g | Freq: Once | INTRAVENOUS | Status: AC
  Administered 2014-06-06: 50 g via INTRAVENOUS
  Filled 2014-06-06: qty 200

## 2014-06-06 NOTE — Procedures (Signed)
US guided therapeutic paracentesis performed yielding 5.4 liters yellow fluid. No immediate complications. The pt will receive IV albumin postprocedure.

## 2014-06-12 DIAGNOSIS — I8500 Esophageal varices without bleeding: Secondary | ICD-10-CM | POA: Diagnosis not present

## 2014-06-12 DIAGNOSIS — R188 Other ascites: Secondary | ICD-10-CM | POA: Diagnosis not present

## 2014-06-12 DIAGNOSIS — K7460 Unspecified cirrhosis of liver: Secondary | ICD-10-CM | POA: Diagnosis not present

## 2014-06-12 DIAGNOSIS — K7290 Hepatic failure, unspecified without coma: Secondary | ICD-10-CM | POA: Diagnosis not present

## 2014-06-15 DIAGNOSIS — Z23 Encounter for immunization: Secondary | ICD-10-CM | POA: Diagnosis not present

## 2014-06-15 DIAGNOSIS — K7030 Alcoholic cirrhosis of liver without ascites: Secondary | ICD-10-CM | POA: Diagnosis not present

## 2014-06-20 ENCOUNTER — Other Ambulatory Visit (HOSPITAL_COMMUNITY): Payer: Self-pay | Admitting: Gastroenterology

## 2014-06-20 DIAGNOSIS — R188 Other ascites: Secondary | ICD-10-CM

## 2014-06-23 ENCOUNTER — Ambulatory Visit (HOSPITAL_COMMUNITY): Payer: Medicare Other

## 2014-06-27 ENCOUNTER — Ambulatory Visit (HOSPITAL_COMMUNITY): Admission: RE | Admit: 2014-06-27 | Payer: Medicare Other | Source: Ambulatory Visit

## 2014-06-28 ENCOUNTER — Other Ambulatory Visit (HOSPITAL_COMMUNITY): Payer: Self-pay | Admitting: Gastroenterology

## 2014-06-28 ENCOUNTER — Ambulatory Visit (HOSPITAL_COMMUNITY)
Admission: RE | Admit: 2014-06-28 | Discharge: 2014-06-28 | Disposition: A | Discharge status: Home / Self Care | Payer: Medicare Other | Source: Ambulatory Visit | Attending: Diagnostic Radiology | Admitting: Diagnostic Radiology

## 2014-06-28 DIAGNOSIS — Z5309 Procedure and treatment not carried out because of other contraindication: Secondary | ICD-10-CM | POA: Diagnosis not present

## 2014-06-28 DIAGNOSIS — R188 Other ascites: Secondary | ICD-10-CM | POA: Insufficient documentation

## 2014-06-28 MED ORDER — LIDOCAINE HCL (PF) 1 % IJ SOLN
INTRAMUSCULAR | Status: AC
  Filled 2014-06-28: qty 10

## 2014-07-01 ENCOUNTER — Ambulatory Visit (HOSPITAL_COMMUNITY): Payer: Medicare Other

## 2014-07-01 DIAGNOSIS — S93602A Unspecified sprain of left foot, initial encounter: Secondary | ICD-10-CM | POA: Diagnosis not present

## 2014-07-17 DIAGNOSIS — R188 Other ascites: Secondary | ICD-10-CM | POA: Diagnosis not present

## 2014-07-17 DIAGNOSIS — K7290 Hepatic failure, unspecified without coma: Secondary | ICD-10-CM | POA: Diagnosis not present

## 2014-07-17 DIAGNOSIS — K7030 Alcoholic cirrhosis of liver without ascites: Secondary | ICD-10-CM | POA: Diagnosis not present

## 2014-07-17 DIAGNOSIS — Z111 Encounter for screening for respiratory tuberculosis: Secondary | ICD-10-CM | POA: Diagnosis not present

## 2014-07-17 DIAGNOSIS — I8500 Esophageal varices without bleeding: Secondary | ICD-10-CM | POA: Diagnosis not present

## 2014-07-17 DIAGNOSIS — Z23 Encounter for immunization: Secondary | ICD-10-CM | POA: Diagnosis not present

## 2014-07-20 DIAGNOSIS — Z23 Encounter for immunization: Secondary | ICD-10-CM | POA: Diagnosis not present

## 2014-07-26 DIAGNOSIS — I8511 Secondary esophageal varices with bleeding: Secondary | ICD-10-CM | POA: Diagnosis not present

## 2014-07-26 DIAGNOSIS — Z7682 Awaiting organ transplant status: Secondary | ICD-10-CM | POA: Diagnosis not present

## 2014-07-26 DIAGNOSIS — I8501 Esophageal varices with bleeding: Secondary | ICD-10-CM | POA: Insufficient documentation

## 2014-07-26 DIAGNOSIS — Z01818 Encounter for other preprocedural examination: Secondary | ICD-10-CM | POA: Diagnosis not present

## 2014-07-26 DIAGNOSIS — I798 Other disorders of arteries, arterioles and capillaries in diseases classified elsewhere: Secondary | ICD-10-CM | POA: Diagnosis not present

## 2014-07-26 DIAGNOSIS — Z01810 Encounter for preprocedural cardiovascular examination: Secondary | ICD-10-CM | POA: Diagnosis not present

## 2014-07-26 DIAGNOSIS — K7031 Alcoholic cirrhosis of liver with ascites: Secondary | ICD-10-CM | POA: Diagnosis not present

## 2014-07-26 DIAGNOSIS — K7030 Alcoholic cirrhosis of liver without ascites: Secondary | ICD-10-CM | POA: Insufficient documentation

## 2014-07-26 DIAGNOSIS — N809 Endometriosis, unspecified: Secondary | ICD-10-CM | POA: Insufficient documentation

## 2014-07-27 ENCOUNTER — Other Ambulatory Visit: Payer: Self-pay

## 2014-07-27 ENCOUNTER — Ambulatory Visit
Admission: RE | Admit: 2014-07-27 | Discharge: 2014-07-27 | Disposition: A | Discharge status: Home / Self Care | Payer: Medicare Other | Source: Ambulatory Visit

## 2014-07-27 DIAGNOSIS — Z1231 Encounter for screening mammogram for malignant neoplasm of breast: Secondary | ICD-10-CM

## 2014-07-31 DIAGNOSIS — E876 Hypokalemia: Secondary | ICD-10-CM | POA: Diagnosis not present

## 2014-08-10 ENCOUNTER — Other Ambulatory Visit (HOSPITAL_COMMUNITY): Payer: Self-pay | Admitting: Obstetrics & Gynecology

## 2014-08-10 ENCOUNTER — Other Ambulatory Visit (HOSPITAL_COMMUNITY)
Admission: RE | Admit: 2014-08-10 | Discharge: 2014-08-10 | Disposition: A | Discharge status: Home / Self Care | Payer: Medicare Other | Source: Ambulatory Visit | Attending: Obstetrics & Gynecology | Admitting: Obstetrics & Gynecology

## 2014-08-10 DIAGNOSIS — R87810 Cervical high risk human papillomavirus (HPV) DNA test positive: Secondary | ICD-10-CM | POA: Insufficient documentation

## 2014-08-10 DIAGNOSIS — Z124 Encounter for screening for malignant neoplasm of cervix: Secondary | ICD-10-CM | POA: Diagnosis not present

## 2014-08-10 DIAGNOSIS — Z1151 Encounter for screening for human papillomavirus (HPV): Secondary | ICD-10-CM | POA: Diagnosis not present

## 2014-08-10 DIAGNOSIS — Z01419 Encounter for gynecological examination (general) (routine) without abnormal findings: Secondary | ICD-10-CM | POA: Diagnosis not present

## 2014-08-14 LAB — CYTOLOGY - PAP

## 2014-08-29 DIAGNOSIS — K7031 Alcoholic cirrhosis of liver with ascites: Secondary | ICD-10-CM | POA: Diagnosis not present

## 2014-08-29 DIAGNOSIS — K7689 Other specified diseases of liver: Secondary | ICD-10-CM | POA: Diagnosis not present

## 2014-08-29 DIAGNOSIS — I8511 Secondary esophageal varices with bleeding: Secondary | ICD-10-CM | POA: Diagnosis not present

## 2014-08-29 DIAGNOSIS — I864 Gastric varices: Secondary | ICD-10-CM | POA: Diagnosis not present

## 2014-08-29 DIAGNOSIS — R935 Abnormal findings on diagnostic imaging of other abdominal regions, including retroperitoneum: Secondary | ICD-10-CM | POA: Diagnosis not present

## 2014-08-29 DIAGNOSIS — R161 Splenomegaly, not elsewhere classified: Secondary | ICD-10-CM | POA: Diagnosis not present

## 2014-08-29 DIAGNOSIS — K769 Liver disease, unspecified: Secondary | ICD-10-CM | POA: Diagnosis not present

## 2014-09-21 DIAGNOSIS — D225 Melanocytic nevi of trunk: Secondary | ICD-10-CM | POA: Diagnosis not present

## 2014-09-21 DIAGNOSIS — D2261 Melanocytic nevi of right upper limb, including shoulder: Secondary | ICD-10-CM | POA: Diagnosis not present

## 2014-09-21 DIAGNOSIS — L819 Disorder of pigmentation, unspecified: Secondary | ICD-10-CM | POA: Diagnosis not present

## 2014-09-21 DIAGNOSIS — L821 Other seborrheic keratosis: Secondary | ICD-10-CM | POA: Diagnosis not present

## 2014-09-21 DIAGNOSIS — D485 Neoplasm of uncertain behavior of skin: Secondary | ICD-10-CM | POA: Diagnosis not present

## 2014-09-25 DIAGNOSIS — K7031 Alcoholic cirrhosis of liver with ascites: Secondary | ICD-10-CM | POA: Diagnosis not present

## 2014-09-26 ENCOUNTER — Encounter: Payer: Self-pay | Admitting: *Deleted

## 2014-09-27 ENCOUNTER — Encounter: Payer: Self-pay | Admitting: *Deleted

## 2014-09-27 ENCOUNTER — Other Ambulatory Visit: Payer: Self-pay | Admitting: *Deleted

## 2014-09-27 DIAGNOSIS — IMO0001 Reserved for inherently not codable concepts without codable children: Secondary | ICD-10-CM | POA: Insufficient documentation

## 2014-09-28 ENCOUNTER — Ambulatory Visit (INDEPENDENT_AMBULATORY_CARE_PROVIDER_SITE_OTHER): Payer: Medicare Other | Admitting: Cardiology

## 2014-09-28 ENCOUNTER — Encounter: Payer: Self-pay | Admitting: Cardiology

## 2014-09-28 VITALS — BP 120/58 | HR 69 | Ht 67.5 in | Wt 138.0 lb

## 2014-09-28 DIAGNOSIS — R0600 Dyspnea, unspecified: Secondary | ICD-10-CM

## 2014-09-28 DIAGNOSIS — R079 Chest pain, unspecified: Secondary | ICD-10-CM | POA: Diagnosis not present

## 2014-09-28 DIAGNOSIS — R001 Bradycardia, unspecified: Secondary | ICD-10-CM

## 2014-09-28 DIAGNOSIS — R9439 Abnormal result of other cardiovascular function study: Secondary | ICD-10-CM

## 2014-09-28 DIAGNOSIS — R0609 Other forms of dyspnea: Secondary | ICD-10-CM | POA: Diagnosis not present

## 2014-09-28 MED ORDER — NADOLOL 20 MG PO TABS
10.0000 mg | ORAL_TABLET | Freq: Two times a day (BID) | ORAL | Status: DC
Start: 2014-09-28 — End: 2015-09-12

## 2014-09-28 NOTE — Progress Notes (Signed)
Patient ID: Nicole Barr, female   DOB: [birth year], 69 y.o.   MRN: [id number]      Cardiology Office Note  Date:  09/28/2014   ID:  Nicole Barr, DOB [birth year], MRN [id number]  PCP:  Landry Dyke, MD  Cardiologist:  Dorothy Spark, MD   Chief complain: chest pain, referral for ecquivocal stress test.   History of Present Illness: Nicole Barr is a 69 y.o. female with h/o alcoholic cirrhosis with esophageal varices, ascites, hepatic encephalopathy evaluated at [facility] hospital for possible transplant and found not to be a candidate at this point as she has a positive stress test. She also has h/o PAD, s/p carotid endarterectomy.  The patient states that she was very active prior to 2015 when she was diagnosed with cirrhosis.  She also has h/o PAD, s/p carotid endarterectomy.  As part of pre -transplant work up she underwent stress echocardiogram with positive ECG portion but negative echo portion.  She is currently deconditioned, unable to exercise as she has no energy and had DOE. She also feels dizzy when she stands up, her responses appear very slow.   Past Medical History  Diagnosis Date  . GERD (gastroesophageal reflux disease)   . Cirrhosis   . Alcoholic cirrhosis   . Ascites   . Endometriosis     Past Surgical History  Procedure Laterality Date  . Carotid endarterectomy Right 2008  . Tonsillectomy      age 64  . Oophrectomy Left 1989  . Laminectomy  1989    lumbar 4-5  . Laparoscopy  1989    adhesions     Current Outpatient Prescriptions  Medication Sig Dispense Refill  . furosemide (LASIX) 20 MG tablet Take 20 mg by mouth 3 (three) times daily.     Marland Kitchen lactulose (CHRONULAC) 10 GM/15ML solution 3-4 tablespoons daily by mouth    . Multiple Vitamins-Minerals (MULTIVITAMIN WITH MINERALS) tablet Take 1 tablet by mouth daily.    . nadolol (CORGARD) 20 MG tablet Take 0.5 tablets (10 mg total) by mouth 2 (two) times daily.    . potassium  chloride SA (KLOR-CON M20) 20 MEQ tablet Take 20 mEq by mouth daily.     . rifaximin (XIFAXAN) 550 MG TABS tablet Take 550 mg by mouth 2 (two) times daily.    Marland Kitchen spironolactone (ALDACTONE) 50 MG tablet Take 50 mg by mouth 3 (three) times daily.     . Vitamin D, Ergocalciferol, (DRISDOL) 50000 UNITS CAPS capsule Take 50,000 Units by mouth every 7 (seven) days.     No current facility-administered medications for this visit.    Allergies:   Iodine and Carvedilol    Social History:  The patient  reports that she quit smoking about 41 years ago. She started smoking about 46 years ago. She does not have any smokeless tobacco history on file. She reports that she does not drink alcohol or use illicit drugs.   Family History:  The patient's family history includes Cancer in her mother; Diabetes in her mother; Heart failure in her father and maternal grandmother; Hyperlipidemia in her mother; Hypertension in her mother; Lupus in her mother; Vitiligo in her mother.    ROS:  Please see the history of present illness.   Otherwise, review of systems are positive for none.   All other systems are reviewed and negative.    PHYSICAL EXAM: VS:  BP 120/58 mmHg  Pulse 69  Ht 5' 7.5" (1.715 m)  Wt 138 lb (62.596  kg)  BMI 21.28 kg/m2  SpO2 98% , BMI Body mass index is 21.28 kg/(m^2). GEN: Well nourished, well developed, in no acute distress HEENT: normal Neck: no JVD, carotid bruits, or masses Cardiac: RRR; no murmurs, rubs, or gallops,no edema  Respiratory:  clear to auscultation bilaterally, normal work of breathing GI: soft, nontender, nondistended, + BS MS: no deformity or atrophy Skin: warm and dry, no rash Neuro:  Strength and sensation are intact Psych: slow responses,   EKG:  EKG from [facility] demonstrates NSR, normal ECG  Recent Labs: No results found for requested labs within last 365 days.   Lipid Panel No results found for: CHOL, TRIG, HDL, CHOLHDL, VLDL, LDLCALC, LDLDIRECT   Wt  Readings from Last 3 Encounters:  09/28/14 138 lb (62.596 kg)  06/06/14 138 lb (62.596 kg)  05/01/14 140 lb (63.504 kg)    Other studies Reviewed: Additional studies/ records that were reviewed today include: as per HPI    ASSESSMENT AND PLAN:  69 year old female  1. Positive stress echocardiogram - we will schedule a coronary CT (Tuesday 10/03/14) as opposed to the cardiac cath as it is non-invasive test in this high risk patient with high risk for bleeding complications.  The patient had iodine allergy listed but she states that it is only with oral iodine, she has had 2 other CT scans in the last month with no complications.  2. Bradycardia, hypotension - orthostasis, we will decrease the dose of nadolol to 10 mg po BID and monitor for bleeding.   Orders Placed This Encounter  Procedures  . CT Heart Morp W/Cta Cor W/Score W/Ca W/Cm &/Or Wo/Cm   Follow up in 6 months.  Signed, Dorothy Spark, MD  09/28/2014 11:50 AM    [address], [place], [place]  [address] Phone: [phone]; Fax: [phone]

## 2014-09-28 NOTE — Patient Instructions (Addendum)
Medication Instructions:  Your physician has recommended you make the following change in your medication:  REDUCE Nadolol to 10mg  twice daily  Labwork: None   Testing/Procedures: Your physician has requested that you have cardiac CT. Cardiac computed tomography (CT) is a painless test that uses an x-ray machine to take clear, detailed pictures of your heart. For further information please visit [email or web address]. Please follow instruction sheet as given. (To be scheduled on 10/03/14)    Follow-Up: Your physician wants you to follow-up in: 6 months with Dr.Nelson You will receive a reminder letter in the mail two months in advance. If you don't receive a letter, please call our office to schedule the follow-up appointment.   Any Other Special Instructions Will Be Listed Below (If Applicable).

## 2014-09-29 DIAGNOSIS — K7031 Alcoholic cirrhosis of liver with ascites: Secondary | ICD-10-CM | POA: Diagnosis not present

## 2014-10-04 ENCOUNTER — Telehealth: Payer: Self-pay | Admitting: *Deleted

## 2014-10-04 DIAGNOSIS — R9439 Abnormal result of other cardiovascular function study: Secondary | ICD-10-CM

## 2014-10-04 NOTE — Telephone Encounter (Signed)
Per scheduling the pt will need a bmet ordered prior to her cardiac ct due to being over 69 years of age.  BMET order placed in epic and scheduling to follow-up with the pt in regards to setting up lab appt.

## 2014-10-11 ENCOUNTER — Ambulatory Visit (HOSPITAL_COMMUNITY): Admission: RE | Admit: 2014-10-11 | Payer: Medicare Other | Source: Ambulatory Visit

## 2014-10-11 ENCOUNTER — Telehealth: Payer: Self-pay | Admitting: *Deleted

## 2014-10-11 DIAGNOSIS — R9439 Abnormal result of other cardiovascular function study: Secondary | ICD-10-CM

## 2014-10-11 NOTE — Telephone Encounter (Signed)
Kim from [facility] department called our office to inform that the pt was scheduled today at 9 am to have a coronary CT done for Dr Meda Coffee to read, and the pt was a no show.  Per Maudie Mercury from [facility] CT, she was informed also that Dr Meda Coffee is out of town for the week, so she would have been unable to read this study.  Per Maudie Mercury from [facility], request that we re-order this test and contact the pt to reschedule.  Placed the new order for the coronary CT in epic.  Made our scheduler, Alyse Low, aware to contact this pt back and reschedule for her to have her coronary CT done at the hospital, on the day Dr Meda Coffee is available to read this.  Per Alyse Low, she will call the pt back to have this rescheduled on Dr [facility] reader day.

## 2014-10-18 DIAGNOSIS — Z23 Encounter for immunization: Secondary | ICD-10-CM | POA: Diagnosis not present

## 2014-10-24 DIAGNOSIS — G479 Sleep disorder, unspecified: Secondary | ICD-10-CM | POA: Diagnosis not present

## 2014-10-25 ENCOUNTER — Ambulatory Visit (HOSPITAL_COMMUNITY)
Admission: RE | Admit: 2014-10-25 | Discharge: 2014-10-25 | Disposition: A | Discharge status: Home / Self Care | Payer: Medicare Other | Source: Ambulatory Visit | Attending: Cardiology | Admitting: Cardiology

## 2014-10-25 DIAGNOSIS — R079 Chest pain, unspecified: Secondary | ICD-10-CM | POA: Diagnosis not present

## 2014-10-25 DIAGNOSIS — R072 Precordial pain: Secondary | ICD-10-CM | POA: Diagnosis not present

## 2014-10-25 MED ORDER — NITROGLYCERIN 0.4 MG SL SUBL
SUBLINGUAL_TABLET | SUBLINGUAL | Status: AC
  Administered 2014-10-25: 0.4 mg via SUBLINGUAL
  Filled 2014-10-25: qty 2

## 2014-10-25 MED ORDER — IOHEXOL 350 MG/ML SOLN
80.0000 mL | Freq: Once | INTRAVENOUS | Status: AC | PRN
  Administered 2014-10-25: 100 mL via INTRAVENOUS

## 2014-10-25 MED ORDER — NITROGLYCERIN 0.4 MG SL SUBL
0.8000 mg | SUBLINGUAL_TABLET | Freq: Once | SUBLINGUAL | Status: AC
  Administered 2014-10-25: 0.4 mg via SUBLINGUAL
  Filled 2014-10-25: qty 25

## 2014-11-07 ENCOUNTER — Other Ambulatory Visit: Payer: Self-pay | Admitting: *Deleted

## 2014-11-07 DIAGNOSIS — R911 Solitary pulmonary nodule: Secondary | ICD-10-CM

## 2014-11-07 NOTE — Progress Notes (Signed)
Per Dr Meda Coffee the pt had a coronary cta and revealed on the chest part of the test that the her lung CT showed a 6 mm nodule - she will need to have repeat chest CT with contrast in 12 months. Order placed into epic and message sent to [facility] to defer and follow-up with the pt in scheduling this 12 months out.  Pt was made aware of this on 7/25 by a triage nurse.

## 2014-11-20 DIAGNOSIS — Z23 Encounter for immunization: Secondary | ICD-10-CM | POA: Diagnosis not present

## 2014-11-21 DIAGNOSIS — K7031 Alcoholic cirrhosis of liver with ascites: Secondary | ICD-10-CM | POA: Diagnosis not present

## 2014-11-21 DIAGNOSIS — I8511 Secondary esophageal varices with bleeding: Secondary | ICD-10-CM | POA: Diagnosis not present

## 2014-11-21 DIAGNOSIS — Z23 Encounter for immunization: Secondary | ICD-10-CM | POA: Diagnosis not present

## 2014-12-07 DIAGNOSIS — K7031 Alcoholic cirrhosis of liver with ascites: Secondary | ICD-10-CM | POA: Diagnosis not present

## 2014-12-08 DIAGNOSIS — Z23 Encounter for immunization: Secondary | ICD-10-CM | POA: Diagnosis not present

## 2014-12-21 DIAGNOSIS — K7031 Alcoholic cirrhosis of liver with ascites: Secondary | ICD-10-CM | POA: Diagnosis not present

## 2014-12-21 DIAGNOSIS — I8511 Secondary esophageal varices with bleeding: Secondary | ICD-10-CM | POA: Diagnosis not present

## 2014-12-21 DIAGNOSIS — K766 Portal hypertension: Secondary | ICD-10-CM | POA: Diagnosis not present

## 2014-12-21 DIAGNOSIS — K7460 Unspecified cirrhosis of liver: Secondary | ICD-10-CM | POA: Diagnosis not present

## 2015-02-20 DIAGNOSIS — K7030 Alcoholic cirrhosis of liver without ascites: Secondary | ICD-10-CM | POA: Diagnosis not present

## 2015-02-20 DIAGNOSIS — I8500 Esophageal varices without bleeding: Secondary | ICD-10-CM | POA: Diagnosis not present

## 2015-02-20 DIAGNOSIS — K7290 Hepatic failure, unspecified without coma: Secondary | ICD-10-CM | POA: Diagnosis not present

## 2015-02-20 DIAGNOSIS — R188 Other ascites: Secondary | ICD-10-CM | POA: Diagnosis not present

## 2015-03-07 DIAGNOSIS — L820 Inflamed seborrheic keratosis: Secondary | ICD-10-CM | POA: Diagnosis not present

## 2015-04-04 ENCOUNTER — Encounter: Payer: Self-pay | Admitting: Cardiology

## 2015-04-04 ENCOUNTER — Ambulatory Visit (INDEPENDENT_AMBULATORY_CARE_PROVIDER_SITE_OTHER): Payer: Medicare Other | Admitting: Cardiology

## 2015-04-04 ENCOUNTER — Ambulatory Visit: Payer: Medicare Other | Admitting: Cardiology

## 2015-04-04 VITALS — BP 110/60 | HR 58 | Ht 67.5 in | Wt 162.8 lb

## 2015-04-04 DIAGNOSIS — I2510 Atherosclerotic heart disease of native coronary artery without angina pectoris: Secondary | ICD-10-CM | POA: Diagnosis not present

## 2015-04-04 DIAGNOSIS — R0602 Shortness of breath: Secondary | ICD-10-CM

## 2015-04-04 DIAGNOSIS — I951 Orthostatic hypotension: Secondary | ICD-10-CM

## 2015-04-04 DIAGNOSIS — R1900 Intra-abdominal and pelvic swelling, mass and lump, unspecified site: Secondary | ICD-10-CM

## 2015-04-04 DIAGNOSIS — E7800 Pure hypercholesterolemia, unspecified: Secondary | ICD-10-CM

## 2015-04-04 DIAGNOSIS — K7030 Alcoholic cirrhosis of liver without ascites: Secondary | ICD-10-CM

## 2015-04-04 DIAGNOSIS — I2583 Coronary atherosclerosis due to lipid rich plaque: Secondary | ICD-10-CM

## 2015-04-04 DIAGNOSIS — R198 Other specified symptoms and signs involving the digestive system and abdomen: Secondary | ICD-10-CM | POA: Insufficient documentation

## 2015-04-04 NOTE — Patient Instructions (Signed)
Your physician recommends that you continue on your current medications as directed. Please refer to the Current Medication list given to you today.     Your physician wants you to follow-up in: 6 MONTHS WITH DR NELSON You will receive a reminder letter in the mail two months in advance. If you don't receive a letter, please call our office to schedule the follow-up appointment.  

## 2015-04-04 NOTE — Progress Notes (Signed)
Patient ID: Nicole Barr, female   DOB: Jul 22, 1945, 69 y.o.   MRN: [id number]      Cardiology Office Note  Date:  04/04/2015   ID:  Nicole Barr, DOB 1945/09/03, MRN [id number]  PCP:  Landry Dyke, MD  Cardiologist:  Dorothy Spark, MD   Chief complain: chest pain, referral for ecquivocal stress test.   History of Present Illness: [name] is a 69 y.o. female with h/o alcoholic cirrhosis with esophageal varices, ascites, hepatic encephalopathy evaluated at [facility] for possible transplant and found not to be a candidate at this point as she has a positive stress test. She also has h/o PAD, s/p carotid endarterectomy. The patient states that she was very active prior to 2015 when she was diagnosed with cirrhosis. She also has h/o PAD, s/p right carotid endarterectomy.  As part of pre -transplant work up she underwent stress echocardiogram with positive ECG portion but negative echo portion.  She underwent a coronary CTA that showed 50-69% stenosis and in the proximal LAD, calcium score of 335. Because of her cirrhosis, she wasn't started on any statins. She eats healthy and goes to gym now several times/week. Not a transplant candidate right now as her MALT score is 9. She denies any chest pain, DOE, palpitations or syncope, no claudications.  She is complaining of gaining weight and increased abdominal girth.   Past Medical History  Diagnosis Date  . GERD (gastroesophageal reflux disease)   . Cirrhosis   . Alcoholic cirrhosis   . Ascites   . Endometriosis    Past Surgical History  Procedure Laterality Date  . Carotid endarterectomy Right 2008  . Tonsillectomy      age 73  . Oophrectomy Left 1989  . Laminectomy  1989    lumbar 4-5  . Laparoscopy  1989    adhesions     Current Outpatient Prescriptions  Medication Sig Dispense Refill  . furosemide (LASIX) 20 MG tablet Take 20 mg by mouth 3 (three) times daily.     Marland Kitchen lactulose  (CHRONULAC) 10 GM/15ML solution 3-4 tablespoons daily by mouth    . Multiple Vitamins-Minerals (MULTIVITAMIN WITH MINERALS) tablet Take 1 tablet by mouth daily.    . nadolol (CORGARD) 20 MG tablet Take 0.5 tablets (10 mg total) by mouth 2 (two) times daily.    . rifaximin (XIFAXAN) 550 MG TABS tablet Take 550 mg by mouth 2 (two) times daily.    Marland Kitchen spironolactone (ALDACTONE) 50 MG tablet Take 50 mg by mouth 3 (three) times daily.     . traZODone (DESYREL) 50 MG tablet Take 50 mg by mouth at bedtime as needed for sleep.    . ursodiol (ACTIGALL) 300 MG capsule Take 300 mg by mouth 2 (two) times daily.     No current facility-administered medications for this visit.    Allergies:   Iodine and Carvedilol    Social History:  The patient  reports that she quit smoking about 41 years ago. She started smoking about 46 years ago. She does not have any smokeless tobacco history on file. She reports that she does not drink alcohol or use illicit drugs.   Family History:  The patient's family history includes Cancer in her mother; Diabetes in her mother; Heart failure in her father and maternal grandmother; Hyperlipidemia in her mother; Hypertension in her mother; Lupus in her mother; Vitiligo in her mother.    ROS:  Please see the history of present illness.   Otherwise,  review of systems are positive for none.   All other systems are reviewed and negative.    PHYSICAL EXAM: VS:  Ht 5' 7.5" (1.715 m)  Wt 162 lb 12.8 oz (73.846 kg)  BMI 25.11 kg/m2 , BMI Body mass index is 25.11 kg/(m^2). GEN: Well nourished, well developed, in no acute distress HEENT: normal Neck: no JVD, carotid bruits, or masses Cardiac: RRR; no murmurs, rubs, or gallops,no edema  Respiratory:  clear to auscultation bilaterally, normal work of breathing GI: soft, nontender, nondistended, + BS MS: no deformity or atrophy Skin: warm and dry, no rash Neuro:  Strength and sensation are intact Psych: slow responses,   EKG:  EKG  from [facility] demonstrates NSR, normal ECG  Recent Labs: No results found for requested labs within last 365 days.   Lipid Panel No results found for: CHOL, TRIG, HDL, CHOLHDL, VLDL, LDLCALC, LDLDIRECT   Wt Readings from Last 3 Encounters:  04/04/15 162 lb 12.8 oz (73.846 kg)  09/28/14 138 lb (62.596 kg)  06/06/14 138 lb (62.596 kg)    Other studies Reviewed: Additional studies/ records that were reviewed today include: as per HPI  Coronary CTA 10/25/2014 IMPRESSION: 1. Coronary calcium score of 335. This was 5 percentile for age and sex matched control.  2. Normal coronary origin. There is right and left co-dominance.  3. There is moderate non-obstructive CAD in the proximal LAD. Diffuse calcified plaque.  An aggressive risk factor modification is recommended.  Ena Dawley   ASSESSMENT AND PLAN:  69 year old female  1. Positive stress echocardiogram - a coronary CT in 10/2014 showed Coronary calcium score of 335 that was 7 percentile for age and sex matched control. There was moderate non-obstructive CAD in the proximal LAD. Diffuse calcified plaque. She wasn't started on statins as she has known cirrhosis with encephalopathy. She is advised about healthy diet that she is compliant with and with exercise.  2. Abdominal distention - sec to cirrhosis, she is advised to use Lasix/spironolactone from BID to TID x 1 week.   3. Bradycardia, hypotension - orthostasis, improved with decreased nadolol to 10 mg po BID.  Follow up in 6 months.  Signed, Dorothy Spark, MD  04/04/2015 9:24 AM    [address], [place], [place]  [address] Phone: [phone]; Fax: [phone]

## 2015-04-06 ENCOUNTER — Encounter: Payer: Self-pay | Admitting: Cardiology

## 2015-04-19 DIAGNOSIS — I8500 Esophageal varices without bleeding: Secondary | ICD-10-CM | POA: Diagnosis not present

## 2015-04-19 DIAGNOSIS — K7030 Alcoholic cirrhosis of liver without ascites: Secondary | ICD-10-CM | POA: Diagnosis not present

## 2015-04-19 DIAGNOSIS — R188 Other ascites: Secondary | ICD-10-CM | POA: Diagnosis not present

## 2015-04-24 ENCOUNTER — Other Ambulatory Visit (HOSPITAL_COMMUNITY): Payer: Self-pay | Admitting: Gastroenterology

## 2015-04-24 DIAGNOSIS — R188 Other ascites: Secondary | ICD-10-CM

## 2015-04-24 DIAGNOSIS — Z139 Encounter for screening, unspecified: Secondary | ICD-10-CM

## 2015-04-25 ENCOUNTER — Other Ambulatory Visit: Payer: Self-pay | Admitting: Gastroenterology

## 2015-04-25 NOTE — Addendum Note (Signed)
Addended by: OUTLAW, WILLIAM on: 04/25/2015 03:22 PM   Modules accepted: Orders  

## 2015-04-26 ENCOUNTER — Encounter (HOSPITAL_COMMUNITY)
Admission: RE | Admit: 2015-04-26 | Discharge: 2015-04-26 | Disposition: A | Discharge status: Home / Self Care | Payer: Medicare Other | Source: Ambulatory Visit | Attending: Gastroenterology | Admitting: Gastroenterology

## 2015-04-26 ENCOUNTER — Other Ambulatory Visit (HOSPITAL_COMMUNITY): Payer: Self-pay | Admitting: *Deleted

## 2015-04-26 ENCOUNTER — Encounter (HOSPITAL_COMMUNITY): Payer: Self-pay | Admitting: *Deleted

## 2015-04-26 DIAGNOSIS — K7460 Unspecified cirrhosis of liver: Secondary | ICD-10-CM | POA: Insufficient documentation

## 2015-04-26 DIAGNOSIS — Z01812 Encounter for preprocedural laboratory examination: Secondary | ICD-10-CM | POA: Insufficient documentation

## 2015-04-26 LAB — COMPREHENSIVE METABOLIC PANEL
ALT: 22 U/L (ref 14–54)
AST: 34 U/L (ref 15–41)
Albumin: 4.3 g/dL (ref 3.5–5.0)
Alkaline Phosphatase: 98 U/L (ref 38–126)
Anion gap: 13 (ref 5–15)
BUN: 16 mg/dL (ref 6–20)
CO2: 25 mmol/L (ref 22–32)
Calcium: 9.8 mg/dL (ref 8.9–10.3)
Chloride: 100 mmol/L — ABNORMAL LOW (ref 101–111)
Creatinine, Ser: 1.09 mg/dL — ABNORMAL HIGH (ref 0.44–1.00)
GFR calc Af Amer: 59 mL/min — ABNORMAL LOW (ref >60)
GFR calc non Af Amer: 51 mL/min — ABNORMAL LOW (ref >60)
Glucose, Bld: 108 mg/dL — ABNORMAL HIGH (ref 65–99)
Potassium: 4.6 mmol/L (ref 3.5–5.1)
Sodium: 138 mmol/L (ref 135–145)
Total Bilirubin: 1.6 mg/dL — ABNORMAL HIGH (ref 0.3–1.2)
Total Protein: 7.2 g/dL (ref 6.5–8.1)

## 2015-04-26 LAB — HEMOGLOBIN AND HEMATOCRIT, BLOOD
HCT: 36.2 % (ref 36.0–46.0)
Hemoglobin: 12.1 g/dL (ref 12.0–15.0)

## 2015-04-27 NOTE — Progress Notes (Signed)
Requested lov note fron transplant visit, ekg, stress echo cherst xray from [place] record by fax with signed release on 04-26-15 and 04-27-15 fax confirmation received and placed on chart

## 2015-04-30 ENCOUNTER — Other Ambulatory Visit: Payer: Self-pay | Admitting: Gastroenterology

## 2015-04-30 NOTE — Progress Notes (Addendum)
04/19/2015-Labs from [place] on chart-PT,CBC w/diff., CMP  04/19/2015-Last office visit note in chart from Dr. Paulita Fujita. No other records received from [facility] as requested.

## 2015-04-30 NOTE — Addendum Note (Signed)
Addended by: OUTLAW, WILLIAM on: 04/30/2015 09:18 AM   Modules accepted: Orders  

## 2015-05-01 ENCOUNTER — Ambulatory Visit (HOSPITAL_COMMUNITY)
Admission: RE | Admit: 2015-05-01 | Discharge: 2015-05-01 | Disposition: A | Discharge status: Home / Self Care | Payer: Medicare Other | Source: Ambulatory Visit | Attending: Gastroenterology | Admitting: Gastroenterology

## 2015-05-01 ENCOUNTER — Other Ambulatory Visit (HOSPITAL_COMMUNITY): Payer: Self-pay | Admitting: Gastroenterology

## 2015-05-01 ENCOUNTER — Encounter (HOSPITAL_COMMUNITY): Admission: RE | Admit: 2015-05-01 | Payer: Medicare Other | Source: Ambulatory Visit

## 2015-05-01 DIAGNOSIS — Z139 Encounter for screening, unspecified: Secondary | ICD-10-CM

## 2015-05-01 DIAGNOSIS — K7460 Unspecified cirrhosis of liver: Secondary | ICD-10-CM | POA: Diagnosis not present

## 2015-05-01 DIAGNOSIS — R188 Other ascites: Secondary | ICD-10-CM | POA: Diagnosis not present

## 2015-05-01 DIAGNOSIS — K7689 Other specified diseases of liver: Secondary | ICD-10-CM | POA: Diagnosis not present

## 2015-05-01 NOTE — Progress Notes (Signed)
Patient ID: Nicole Barr, female   DOB: Feb 13, 1946, 70 y.o.   MRN: [id number] Pt presented to [place] dept today for paracentesis. On limited US abd in all four quadrants there is no significant ascites present. Procedure was canceled. Pt informed.

## 2015-05-01 NOTE — Progress Notes (Signed)
05-01-15 1100 Copy of 12 lead EKG 04-04-15 , Echo 07-26-14  with chart from Epic scan.

## 2015-05-02 ENCOUNTER — Ambulatory Visit (HOSPITAL_COMMUNITY): Payer: Medicare Other | Admitting: Certified Registered Nurse Anesthetist

## 2015-05-02 ENCOUNTER — Encounter (HOSPITAL_COMMUNITY): Payer: Self-pay

## 2015-05-02 ENCOUNTER — Ambulatory Visit (HOSPITAL_COMMUNITY)
Admission: RE | Admit: 2015-05-02 | Discharge: 2015-05-02 | Disposition: A | Discharge status: Home / Self Care | Payer: Medicare Other | Source: Ambulatory Visit | Attending: Gastroenterology | Admitting: Gastroenterology

## 2015-05-02 ENCOUNTER — Encounter (HOSPITAL_COMMUNITY)
Admission: RE | Disposition: A | Discharge status: Home / Self Care | Payer: Self-pay | Source: Ambulatory Visit | Attending: Gastroenterology

## 2015-05-02 DIAGNOSIS — Z87891 Personal history of nicotine dependence: Secondary | ICD-10-CM | POA: Insufficient documentation

## 2015-05-02 DIAGNOSIS — K208 Other esophagitis: Secondary | ICD-10-CM | POA: Diagnosis not present

## 2015-05-02 DIAGNOSIS — K759 Inflammatory liver disease, unspecified: Secondary | ICD-10-CM | POA: Diagnosis not present

## 2015-05-02 DIAGNOSIS — K219 Gastro-esophageal reflux disease without esophagitis: Secondary | ICD-10-CM | POA: Diagnosis not present

## 2015-05-02 DIAGNOSIS — I8510 Secondary esophageal varices without bleeding: Secondary | ICD-10-CM | POA: Diagnosis not present

## 2015-05-02 DIAGNOSIS — K7460 Unspecified cirrhosis of liver: Secondary | ICD-10-CM | POA: Diagnosis not present

## 2015-05-02 DIAGNOSIS — K3189 Other diseases of stomach and duodenum: Secondary | ICD-10-CM | POA: Insufficient documentation

## 2015-05-02 DIAGNOSIS — K766 Portal hypertension: Secondary | ICD-10-CM | POA: Insufficient documentation

## 2015-05-02 DIAGNOSIS — I2510 Atherosclerotic heart disease of native coronary artery without angina pectoris: Secondary | ICD-10-CM | POA: Diagnosis not present

## 2015-05-02 DIAGNOSIS — I8500 Esophageal varices without bleeding: Secondary | ICD-10-CM | POA: Diagnosis not present

## 2015-05-02 DIAGNOSIS — Z79899 Other long term (current) drug therapy: Secondary | ICD-10-CM | POA: Insufficient documentation

## 2015-05-02 HISTORY — PX: GASTRIC VARICES BANDING: SHX5519

## 2015-05-02 HISTORY — DX: Other specified diseases of liver: K76.89

## 2015-05-02 HISTORY — PX: ESOPHAGOGASTRODUODENOSCOPY (EGD) WITH PROPOFOL: SHX5813

## 2015-05-02 SURGERY — ESOPHAGOGASTRODUODENOSCOPY (EGD) WITH PROPOFOL
Anesthesia: Monitor Anesthesia Care

## 2015-05-02 MED ORDER — ONDANSETRON HCL 4 MG/2ML IJ SOLN
INTRAMUSCULAR | Status: AC
  Filled 2015-05-02: qty 2

## 2015-05-02 MED ORDER — LACTATED RINGERS IV SOLN
INTRAVENOUS | Status: DC | PRN
  Administered 2015-05-02: 10:00:00 via INTRAVENOUS

## 2015-05-02 MED ORDER — SODIUM CHLORIDE 0.9 % IV SOLN: INTRAVENOUS | Status: DC

## 2015-05-02 MED ORDER — PROPOFOL 500 MG/50ML IV EMUL
INTRAVENOUS | Status: DC | PRN
  Administered 2015-05-02: 125 ug/kg/min via INTRAVENOUS

## 2015-05-02 MED ORDER — MORPHINE SULFATE 20 MG/5ML PO SOLN: 2.5000 mg | ORAL | Status: DC | PRN

## 2015-05-02 MED ORDER — PROPOFOL 10 MG/ML IV BOLUS
INTRAVENOUS | Status: DC | PRN
  Administered 2015-05-02: 10 mg via INTRAVENOUS
  Administered 2015-05-02 (×2): 20 mg via INTRAVENOUS

## 2015-05-02 MED ORDER — LIDOCAINE HCL (CARDIAC) 20 MG/ML IV SOLN
INTRAVENOUS | Status: AC
  Filled 2015-05-02: qty 5

## 2015-05-02 MED ORDER — LIDOCAINE HCL (CARDIAC) 20 MG/ML IV SOLN
INTRAVENOUS | Status: DC | PRN
  Administered 2015-05-02: 100 mg via INTRAVENOUS

## 2015-05-02 MED ORDER — PHENYLEPHRINE 40 MCG/ML (10ML) SYRINGE FOR IV PUSH (FOR BLOOD PRESSURE SUPPORT)
PREFILLED_SYRINGE | INTRAVENOUS | Status: AC
  Filled 2015-05-02: qty 10

## 2015-05-02 MED ORDER — ONDANSETRON HCL 4 MG/2ML IJ SOLN
INTRAMUSCULAR | Status: DC | PRN
  Administered 2015-05-02: 4 mg via INTRAVENOUS

## 2015-05-02 MED ORDER — PROPOFOL 10 MG/ML IV BOLUS
INTRAVENOUS | Status: AC
  Filled 2015-05-02: qty 60

## 2015-05-02 SURGICAL SUPPLY — 15 items

## 2015-05-02 NOTE — Discharge Instructions (Signed)
Endoscopy Care After Please read the instructions outlined below and refer to this sheet in the next few weeks. These discharge instructions provide you with general information on caring for yourself after you leave the hospital. Your doctor may also give you specific instructions. While your treatment has been planned according to the most current medical practices available, unavoidable complications occasionally occur. If you have any problems or questions after discharge, please call Dr. Paulita Fujita [facility]) at [phone].  HOME CARE INSTRUCTIONS Activity  You may resume your regular activity but move at a slower pace for the next 24 hours.   Take frequent rest periods for the next 24 hours.   Walking will help expel (get rid of) the air and reduce the bloated feeling in your abdomen.   No driving for 24 hours (because of the anesthesia (medicine) used during the test).   You may shower.   Do not sign any important legal documents or operate any machinery for 24 hours (because of the anesthesia used during the test).  Nutrition  Drink plenty of fluids.   Clear liquid diet for 24 hours, then soft mechanical diet for 48 hours, then slowly advance to regular diet in 3 days.  Begin with a light meal and progress to your normal diet.   Avoid alcoholic beverages for 24 hours or as instructed by your caregiver.  Medications You may resume your normal medications unless your caregiver tells you otherwise. What you can expect today  You may experience abdominal discomfort such as a feeling of fullness or "gas" pains.   You may experience a sore throat for 2 to 3 days. This is normal. Gargling with salt water may help this.    SEEK IMMEDIATE MEDICAL CARE IF:  You have excessive nausea (feeling sick to your stomach) and/or vomiting.   You have severe abdominal pain and distention (swelling).   You have trouble swallowing.   You have a temperature over 100 F (37.8  C).   You have rectal bleeding or vomiting of blood.  Document Released: 11/06/2003 Document Revised: 12/04/2010 Document Reviewed: 05/19/2007 [facility] Patient Information 2012 [employer].

## 2015-05-02 NOTE — Anesthesia Preprocedure Evaluation (Addendum)
Anesthesia Evaluation  Patient identified by MRN, date of birth, ID band Patient awake    Reviewed: Allergy & Precautions, NPO status , Patient's Chart, lab work & pertinent test results  Airway Mallampati: II  TM Distance: >3 FB Neck ROM: Full    Dental  (+) Teeth Intact   Pulmonary former smoker,    breath sounds clear to auscultation       Cardiovascular + CAD   Rhythm:Regular Rate:Normal     Neuro/Psych PSYCHIATRIC DISORDERS Depression    GI/Hepatic Neg liver ROS, GERD  Medicated,(+) Cirrhosis   Esophageal Varices and ascites  substance abuse  alcohol use, Hepatitis -, Toxin Related  Endo/Other  negative endocrine ROS  Renal/GU negative Renal ROS  negative genitourinary   Musculoskeletal negative musculoskeletal ROS (+)   Abdominal   Peds negative pediatric ROS (+)  Hematology   Anesthesia Other Findings   Reproductive/Obstetrics negative OB ROS                            Lab Results  Component Value Date   HGB 12.1 04/26/2015   HCT 36.2 04/26/2015   No results found for: INR, PROTIME  03/2015 EKG: sinus bradycardia.  07/2014 Echo & Stress Test ([facility]): Reviewed  Anesthesia Physical Anesthesia Plan  ASA: III  Anesthesia Plan: MAC   Post-op Pain Management:    Induction: Intravenous  Airway Management Planned: Natural Airway  Additional Equipment:   Intra-op Plan:   Post-operative Plan:   Informed Consent: I have reviewed the patients History and Physical, chart, labs and discussed the procedure including the risks, benefits and alternatives for the proposed anesthesia with the patient or authorized representative who has indicated his/her understanding and acceptance.     Plan Discussed with: CRNA  Anesthesia Plan Comments:         Anesthesia Quick Evaluation

## 2015-05-02 NOTE — Anesthesia Postprocedure Evaluation (Signed)
Anesthesia Post Note  Patient: Nicole Barr  Procedure(s) Performed: Procedure(s) (LRB): ESOPHAGOGASTRODUODENOSCOPY (EGD) WITH PROPOFOL (N/A) GASTRIC VARICES BANDING (N/A)  Patient location during evaluation: PACU Anesthesia Type: MAC Level of consciousness: awake and alert Pain management: pain level controlled Vital Signs Assessment: post-procedure vital signs reviewed and stable Respiratory status: spontaneous breathing, nonlabored ventilation, respiratory function stable and patient connected to nasal cannula oxygen Cardiovascular status: stable and blood pressure returned to baseline Anesthetic complications: no    Last Vitals:  Filed Vitals:   05/02/15 1120 05/02/15 1130  BP: 126/68 87/52  Pulse: 64 66  Temp:    Resp: 23 20    Last Pain: There were no vitals filed for this visit.               Effie Berkshire

## 2015-05-02 NOTE — H&P (Signed)
Patient interval history reviewed.  Patient examined again.  There has been no change from documented H/P dated 04/19/15 (scanned into chart from our office) except as documented above.  Assessment:  1.  Cirrhosis. 2.  History esophageal variceal bleeding.  Prior banding done at [place]:  1.  Endoscopy with possible esophageal variceal band ligation. 2.  Risks (bleeding, infection, bowel perforation that could require surgery, sedation-related changes in cardiopulmonary systems), benefits (identification and possible treatment of source of symptoms, exclusion of certain causes of symptoms), and alternatives (watchful waiting, radiographic imaging studies, empiric medical treatment) of upper endoscopy (EGD) were explained to patient/family in detail and patient wishes to proceed.  Counseled patient that some mild-to-moderate chest discomfort is not unusual after esophageal band placement.

## 2015-05-02 NOTE — Transfer of Care (Signed)
Immediate Anesthesia Transfer of Care Note  Patient: Nicole Barr  Procedure(s) Performed: Procedure(s): ESOPHAGOGASTRODUODENOSCOPY (EGD) WITH PROPOFOL (N/A) GASTRIC VARICES BANDING (N/A)  Patient Location: PACU and Endoscopy Unit  Anesthesia Type:MAC  Level of Consciousness: awake, alert  and patient cooperative  Airway & Oxygen Therapy: Patient Spontanous Breathing and Patient connected to nasal cannula oxygen  Post-op Assessment: Report given to RN and Post -op Vital signs reviewed and stable  Post vital signs: Reviewed and stable  Last Vitals:  Filed Vitals:   05/02/15 0952  BP: 122/65  Pulse: 64  Temp: 36.7 C  Resp: 16    Complications: No apparent anesthesia complications

## 2015-05-02 NOTE — Op Note (Signed)
[facility] [place], [address]   ENDOSCOPY PROCEDURE REPORT  PATIENT: Nicole Barr, Nicole Barr  MR#: [id number] BIRTHDATE: Jun 14, 1945 , 62  yrs. old GENDER: female ENDOSCOPIST: Arta Silence, MD REFERRED BY:  Gaynelle Arabian, M.D. PROCEDURE DATE:  May 28, 2015 PROCEDURE:  EGD w/ band ligation of varices ASA CLASS:     Class III INDICATIONS:  cirrhosis, prior history of esophageal varices. MEDICATIONS: Monitored anesthesia care TOPICAL ANESTHETIC:  DESCRIPTION OF PROCEDURE: After the risks benefits and alternatives of the procedure were thoroughly explained, informed consent was obtained.  The Pentax Gastroscope [id number] endoscope was introduced through the mouth and advanced to the second portion of the duodenum. The instrument was slowly withdrawn as the mucosa was fully examined. Estimated blood loss is zero unless otherwise noted in this procedure report.    Findings:  Two columns of Grade III mid-to-distal esophageal varices noted.  3 bands were placed without complication.  Mild distal erosive esophagitis at level of GE junction.  Moderate diffuse portal hypertensive gastropathy; few antral erosions; no gastric varices seen upon retroflexion.  Mild bulbar duodenitis.  Otherwise normal endoscopy to the second portion of the duodenum. The scope was then withdrawn from the patient and the procedure completed.  COMPLICATIONS: There were no immediate complications.  ENDOSCOPIC IMPRESSION:     As above.  Esophageal varices banded.  RECOMMENDATIONS:     1.  Watch for potential complications of procedure. 2.  No NSAIDs. 3.  Liquid diet x one day, then soft mechanical diet x two days, then slowly resume regular diet thereafter. 4.  Follow-up in office in 2 weeks, repeat endoscopy with possible further banding in 3-4 weeks. 5.  Continue nadolol for now.  eSigned:  Arta Silence, MD May 28, 2015 11:23 AM   CC:  CPT CODES: ICD CODES:  The  ICD and CPT codes recommended by this software are interpretations from the data that the clinical staff has captured with the software.  The verification of the translation of this report to the ICD and CPT codes and modifiers is the sole responsibility of the health care institution and practicing physician where this report was generated.  [employer]. will not be held responsible for the validity of the ICD and CPT codes included on this report.  AMA assumes no liability for data contained or not contained herein. CPT is a Designer, television/film set of the [employer].

## 2015-05-03 ENCOUNTER — Encounter (HOSPITAL_COMMUNITY): Payer: Self-pay | Admitting: Gastroenterology

## 2015-05-08 DIAGNOSIS — I8500 Esophageal varices without bleeding: Secondary | ICD-10-CM | POA: Diagnosis not present

## 2015-05-09 ENCOUNTER — Other Ambulatory Visit: Payer: Self-pay | Admitting: *Deleted

## 2015-05-09 DIAGNOSIS — I2583 Coronary atherosclerosis due to lipid rich plaque: Secondary | ICD-10-CM

## 2015-05-09 DIAGNOSIS — E7800 Pure hypercholesterolemia, unspecified: Secondary | ICD-10-CM

## 2015-05-09 DIAGNOSIS — K745 Biliary cirrhosis, unspecified: Secondary | ICD-10-CM

## 2015-05-09 DIAGNOSIS — I2510 Atherosclerotic heart disease of native coronary artery without angina pectoris: Secondary | ICD-10-CM

## 2015-05-09 NOTE — Progress Notes (Signed)
CMET to be ordered and done on 10/29/15 at our office , for prescreening of ordered chest ct on 11/07/15.  This was requested lab order to be placed by [facility] scheduler Vashti Hey.

## 2015-05-15 DIAGNOSIS — G479 Sleep disorder, unspecified: Secondary | ICD-10-CM | POA: Diagnosis not present

## 2015-05-30 ENCOUNTER — Telehealth: Payer: Self-pay | Admitting: Cardiology

## 2015-05-30 DIAGNOSIS — K745 Biliary cirrhosis, unspecified: Secondary | ICD-10-CM

## 2015-05-30 DIAGNOSIS — E7800 Pure hypercholesterolemia, unspecified: Secondary | ICD-10-CM

## 2015-05-30 DIAGNOSIS — I2510 Atherosclerotic heart disease of native coronary artery without angina pectoris: Secondary | ICD-10-CM

## 2015-05-30 DIAGNOSIS — I2583 Coronary atherosclerosis due to lipid rich plaque: Secondary | ICD-10-CM

## 2015-05-30 DIAGNOSIS — K769 Liver disease, unspecified: Secondary | ICD-10-CM

## 2015-05-30 NOTE — Telephone Encounter (Signed)
Pt calling in to inform Dr Meda Coffee that she is retaining more fluid in her abdominal girth area, to the point where its affecting her "being on the go all the time." Pt states that it is coming from taking the medication nadolol.  Pt states that "nadolol is making my abdomen bigger, and I'm taking all these diuretics, and nothing is working because I never put anything out." Pt seemed to be slightly belligerent while talking on the phone.  She then proceeded to say that "nadolol causes me to be sleepy, but keeps me up all night causing insomnia." Pt states she has absolutely no cardiac complaints at all.  No chest pain, no sob, no DOE, no LEE, no palpitations, no dizziness, no pre-syncopal or syncopal issues.  Pt states she exercises at the gym "all day, but the nadolol is holding me back." Pt states she see's Dr Paulita Fujita for her cirrhosis of the liver, and her enzymes were "somewhat ok back in January." Pt then states "the only 2 MDs I know to reach out to who could possibly follow my issues are Dr Meda Coffee and Dr Paulita Fujita." Advised the pt that she should utilize her PCP, if her complaint are not cardiac related.  Pt then stated "my PCP is a quack."  "Thats why I called your office first."  "Next I will call Dr Erlinda Hong office." Advised the pt that if she is not happy with her current PCP, she should seek out another PCP in the area.  Suggested the pt look into the [facility] offices.   When talking to the pt, it was hard to follow her story and what exactly she wanted me to request from Dr Meda Coffee.  Asked the pt reiterate exactly what she wants me to ask Dr Meda Coffee to advise on, and the pt states to tell her "the nadolol is causing my abdomen to get bigger, and I'm slowing down on going all the time because of the nadolol, and it makes me sleepy and causes me insomnia." Informed the pt that I will get this message to her and follow-up with any new recommendations.  Pt verbalized understanding and  agrees with this plan.

## 2015-05-30 NOTE — Telephone Encounter (Signed)
New message    Pt c/o medication issue:  1. Name of Medication: nadolol 20mg   2. How are you currently taking this medication (dosage and times per day)? 1x day  3. Are you having a reaction (difficulty breathing--STAT)? no 4. What is your medication issue? Fluid building up in chest , upper area

## 2015-05-31 MED ORDER — SPIRONOLACTONE 50 MG PO TABS: 50.0000 mg | ORAL_TABLET | Freq: Three times a day (TID) | ORAL | Status: DC

## 2015-05-31 MED ORDER — FUROSEMIDE 20 MG PO TABS: 20.0000 mg | ORAL_TABLET | Freq: Three times a day (TID) | ORAL | Status: DC

## 2015-05-31 NOTE — Telephone Encounter (Signed)
She can try to increase Lasix/spironolactone from BID to TID x 1 week to see if there is any improvement.

## 2015-05-31 NOTE — Telephone Encounter (Signed)
Notified the pt that per Dr Meda Coffee, she clarified the meds and recommends Lasix 20 mg po TID and spironolactone 50 mg TID, and come in for a BMET in 1 week.  Pt verbalized understanding and agrees with this plan.  Pt states she has enough lasix and spironolactone on hand.  Scheduled the pt a lab appt for next Thursday 06/07/15 at our office to check a bmet.

## 2015-05-31 NOTE — Telephone Encounter (Signed)
Notified the pt that per Dr Meda Coffee, she recommends that we increase her lasix/spironolactone from BID to TID x 1 week to see if there is any improvement with mentioned complaints.  Advised the pt to call the office next Thursday, and ask to speak with myself or a triage nurse,  to report her symptoms.  Per the pt she states she is already prescribed spironolactone TID, but she will increase her lasix from BID to TID x 1 week.  Informed the pt that I will clarify the spironolactone dose with Dr Meda Coffee, but advised her to take it as she already has been at TID, and I will follow-up with her, with any further recommendations from Dr Meda Coffee.  Pt verbalized understanding and agrees with this plan.

## 2015-05-31 NOTE — Telephone Encounter (Signed)
Lasix 20 mg po TID Spironolactone 50 mg po TID  BMP in 1 week

## 2015-05-31 NOTE — Addendum Note (Signed)
Addended by: Nuala Alpha on: 05/31/2015 01:10 PM   Modules accepted: Orders

## 2015-06-04 ENCOUNTER — Other Ambulatory Visit: Payer: Self-pay | Admitting: Gastroenterology

## 2015-06-05 ENCOUNTER — Encounter (HOSPITAL_COMMUNITY): Payer: Self-pay | Admitting: *Deleted

## 2015-06-06 ENCOUNTER — Encounter (HOSPITAL_COMMUNITY)
Admission: RE | Disposition: A | Discharge status: Home / Self Care | Payer: Self-pay | Source: Ambulatory Visit | Attending: Gastroenterology

## 2015-06-06 ENCOUNTER — Ambulatory Visit (HOSPITAL_COMMUNITY): Payer: Medicare Other | Admitting: Anesthesiology

## 2015-06-06 ENCOUNTER — Encounter (HOSPITAL_COMMUNITY): Payer: Self-pay | Admitting: *Deleted

## 2015-06-06 ENCOUNTER — Ambulatory Visit (HOSPITAL_COMMUNITY)
Admission: RE | Admit: 2015-06-06 | Discharge: 2015-06-06 | Disposition: A | Discharge status: Home / Self Care | Payer: Medicare Other | Source: Ambulatory Visit | Attending: Gastroenterology | Admitting: Gastroenterology

## 2015-06-06 DIAGNOSIS — K219 Gastro-esophageal reflux disease without esophagitis: Secondary | ICD-10-CM | POA: Insufficient documentation

## 2015-06-06 DIAGNOSIS — Z87891 Personal history of nicotine dependence: Secondary | ICD-10-CM | POA: Insufficient documentation

## 2015-06-06 DIAGNOSIS — K2210 Ulcer of esophagus without bleeding: Secondary | ICD-10-CM | POA: Insufficient documentation

## 2015-06-06 DIAGNOSIS — K766 Portal hypertension: Secondary | ICD-10-CM | POA: Insufficient documentation

## 2015-06-06 DIAGNOSIS — Z79899 Other long term (current) drug therapy: Secondary | ICD-10-CM | POA: Diagnosis not present

## 2015-06-06 DIAGNOSIS — I8500 Esophageal varices without bleeding: Secondary | ICD-10-CM | POA: Diagnosis not present

## 2015-06-06 DIAGNOSIS — K228 Other specified diseases of esophagus: Secondary | ICD-10-CM | POA: Diagnosis not present

## 2015-06-06 DIAGNOSIS — K7030 Alcoholic cirrhosis of liver without ascites: Secondary | ICD-10-CM | POA: Diagnosis not present

## 2015-06-06 DIAGNOSIS — K3189 Other diseases of stomach and duodenum: Secondary | ICD-10-CM | POA: Diagnosis not present

## 2015-06-06 DIAGNOSIS — K7460 Unspecified cirrhosis of liver: Secondary | ICD-10-CM | POA: Insufficient documentation

## 2015-06-06 DIAGNOSIS — I2510 Atherosclerotic heart disease of native coronary artery without angina pectoris: Secondary | ICD-10-CM | POA: Diagnosis not present

## 2015-06-06 HISTORY — PX: ESOPHAGOGASTRODUODENOSCOPY (EGD) WITH PROPOFOL: SHX5813

## 2015-06-06 SURGERY — ESOPHAGOGASTRODUODENOSCOPY (EGD) WITH PROPOFOL
Anesthesia: Monitor Anesthesia Care

## 2015-06-06 MED ORDER — SODIUM CHLORIDE 0.9 % IV SOLN
INTRAVENOUS | Status: DC
Start: 2015-06-06 — End: 2015-06-06

## 2015-06-06 MED ORDER — PROPOFOL 10 MG/ML IV BOLUS
INTRAVENOUS | Status: AC
  Filled 2015-06-06: qty 40

## 2015-06-06 MED ORDER — LIDOCAINE HCL (CARDIAC) 20 MG/ML IV SOLN
INTRAVENOUS | Status: AC
  Filled 2015-06-06: qty 5

## 2015-06-06 MED ORDER — PROPOFOL 10 MG/ML IV BOLUS
INTRAVENOUS | Status: DC | PRN
  Administered 2015-06-06: 20 mg via INTRAVENOUS

## 2015-06-06 MED ORDER — LIDOCAINE HCL (CARDIAC) 20 MG/ML IV SOLN
INTRAVENOUS | Status: DC | PRN
  Administered 2015-06-06: 100 mg via INTRAVENOUS

## 2015-06-06 MED ORDER — LACTATED RINGERS IV SOLN
INTRAVENOUS | Status: DC
  Administered 2015-06-06: 1000 mL via INTRAVENOUS

## 2015-06-06 MED ORDER — PROPOFOL 500 MG/50ML IV EMUL
INTRAVENOUS | Status: DC | PRN
  Administered 2015-06-06: 140 ug/kg/min via INTRAVENOUS

## 2015-06-06 SURGICAL SUPPLY — 15 items

## 2015-06-06 NOTE — Addendum Note (Signed)
Addendum  created 06/06/15 1254 by Sharlette Dense, CRNA   Modules edited: Charges VN

## 2015-06-06 NOTE — Anesthesia Preprocedure Evaluation (Addendum)
Anesthesia Evaluation  Patient identified by MRN, date of birth, ID band Patient awake    Reviewed: Allergy & Precautions, NPO status , Patient's Chart, lab work & pertinent test results  Airway Mallampati: II  TM Distance: >3 FB Neck ROM: Full    Dental  (+) Dental Advisory Given, Caps All upper front are capped:   Pulmonary shortness of breath and with exertion, former smoker,    Pulmonary exam normal breath sounds clear to auscultation       Cardiovascular + CAD  Normal cardiovascular exam Rhythm:Regular Rate:Normal     Neuro/Psych PSYCHIATRIC DISORDERS Depression    GI/Hepatic Neg liver ROS, GERD  Medicated,(+) Cirrhosis   Esophageal Varices and ascites  substance abuse  alcohol use, Hepatitis -, Toxin Related  Endo/Other  negative endocrine ROS  Renal/GU negative Renal ROS  negative genitourinary   Musculoskeletal negative musculoskeletal ROS (+)   Abdominal   Peds negative pediatric ROS (+)  Hematology   Anesthesia Other Findings   Reproductive/Obstetrics negative OB ROS                            Anesthesia Physical Anesthesia Plan  ASA: III  Anesthesia Plan: MAC   Post-op Pain Management:    Induction:   Airway Management Planned:   Additional Equipment:   Intra-op Plan:   Post-operative Plan:   Informed Consent:   Plan Discussed with: Surgeon  Anesthesia Plan Comments:         Anesthesia Quick Evaluation

## 2015-06-06 NOTE — Discharge Instructions (Signed)
Endoscopy °Care After °Please read the instructions outlined below and refer to this sheet in the next few weeks. These discharge instructions provide you with general information on caring for yourself after you leave the hospital. Your doctor may also give you specific instructions. While your treatment has been planned according to the most current medical practices available, unavoidable complications occasionally occur. If you have any problems or questions after discharge, please call Dr. Hellon Vaccarella ([facility]) at [phone]. ° °HOME CARE INSTRUCTIONS °Activity °· You may resume your regular activity but move at a slower pace for the next 24 hours.  °· Take frequent rest periods for the next 24 hours.  °· Walking will help expel (get rid of) the air and reduce the bloated feeling in your abdomen.  °· No driving for 24 hours (because of the anesthesia (medicine) used during the test).  °· You may shower.  °· Do not sign any important legal documents or operate any machinery for 24 hours (because of the anesthesia used during the test).  °Nutrition °· Drink plenty of fluids.  °· You may resume your normal diet.  °· Begin with a light meal and progress to your normal diet.  °· Avoid alcoholic beverages for 24 hours or as instructed by your caregiver.  °Medications °You may resume your normal medications unless your caregiver tells you otherwise. °What you can expect today °· You may experience abdominal discomfort such as a feeling of fullness or "gas" pains.  °· You may experience a sore throat for 2 to 3 days. This is normal. Gargling with salt water may help this.  °·  °SEEK IMMEDIATE MEDICAL CARE IF: °· You have excessive nausea (feeling sick to your stomach) and/or vomiting.  °· You have severe abdominal pain and distention (swelling).  °· You have trouble swallowing.  °· You have a temperature over 100° F (37.8° C).  °· You have rectal bleeding or vomiting of blood.  °Document Released:  11/06/2003 Document Revised: 12/04/2010 Document Reviewed: 05/19/2007 °[employer]® Patient Information ©2012 [employer]. °

## 2015-06-06 NOTE — Transfer of Care (Signed)
Immediate Anesthesia Transfer of Care Note  Patient: Nicole Barr  Procedure(s) Performed: Procedure(s): ESOPHAGOGASTRODUODENOSCOPY (EGD) WITH PROPOFOL (N/A) GASTRIC VARICES BANDING (N/A)  Patient Location: Endoscopy Unit  Anesthesia Type:MAC  Level of Consciousness: awake  Airway & Oxygen Therapy: Patient Spontanous Breathing and Patient connected to nasal cannula oxygen  Post-op Assessment: Report given to RN and Post -op Vital signs reviewed and stable  Post vital signs: Reviewed and stable  Last Vitals:  Filed Vitals:   06/06/15 0809 06/06/15 0817  BP:  115/69  Pulse:  60  Temp: 36.6 C   Resp:  12    Complications: No apparent anesthesia complications

## 2015-06-06 NOTE — Op Note (Signed)
[facility] [place], [id number]   ENDOSCOPY PROCEDURE REPORT  PATIENT: Nicole Barr, Nicole Barr  MR#: [id number] BIRTHDATE: [birth year] , 74  yrs. old GENDER: female ENDOSCOPIST: Arta Silence, MD REFERRED BY:  Gaynelle Arabian, M.D. PROCEDURE DATE:  07/02/15 PROCEDURE:  EGD, diagnostic ASA CLASS:     Class III INDICATIONS:  cirrhosis, esophageal varices. MEDICATIONS: Monitored anesthesia care TOPICAL ANESTHETIC:  DESCRIPTION OF PROCEDURE: After the risks benefits and alternatives of the procedure were thoroughly explained, informed consent was obtained.  The EG [id number] ) endoscope was introduced through the mouth and advanced to the second portion of the duodenum. The instrument was slowly withdrawn as the mucosa was fully examined. Estimated blood loss is zero unless otherwise noted in this procedure report.    Findings:  Band ulcers and some fibrosis seen in mid-to-distal esophagus.  No significant residual esophageal varices were identified.  Mild diffuse portal hypertensive gastropathy. Otherwise normal esophagus, stomach, pylorus and duodenum to the second portion.      Retroflexion into the cardia showed no evidence of gastric varices.          The scope was then withdrawn from the patient and the procedure completed.  COMPLICATIONS: There were no immediate complications.  ENDOSCOPIC IMPRESSION:     Esophageal varices have been successfully obliterated.  RECOMMENDATIONS:     1.  Watch for potential complications of procedure. 2.  Continue current medical therapy. 3.  Follow-up in [facility] for cirrhosis management in 3-4 months.  eSigned:  Arta Silence, MD 2015/07/02 9:55 AM   CC:  CPT CODES: ICD CODES:  The ICD and CPT codes recommended by this software are interpretations from the data that the clinical staff has captured with the software.  The verification of the translation of this report to the ICD and CPT  codes and modifiers is the sole responsibility of the health care institution and practicing physician where this report was generated.  [employer] will not be held responsible for the validity of the ICD and CPT codes included on this report.  AMA assumes no liability for data contained or not contained herein. CPT is a Designer, television/film set of the [employer].

## 2015-06-06 NOTE — Anesthesia Postprocedure Evaluation (Signed)
Anesthesia Post Note  Patient: Nicole Barr  Procedure(s) Performed: Procedure(s) (LRB): ESOPHAGOGASTRODUODENOSCOPY (EGD) WITH PROPOFOL (N/A) GASTRIC VARICES BANDING (N/A)  Patient location during evaluation: PACU Anesthesia Type: MAC Level of consciousness: awake and alert Pain management: pain level controlled Vital Signs Assessment: post-procedure vital signs reviewed and stable Respiratory status: spontaneous breathing, nonlabored ventilation, respiratory function stable and patient connected to nasal cannula oxygen Cardiovascular status: blood pressure returned to baseline and stable Postop Assessment: no signs of nausea or vomiting Anesthetic complications: no    Last Vitals:  Filed Vitals:   06/06/15 0817 06/06/15 0936  BP: 115/69 100/52  Pulse: 60 65  Temp:    Resp: 12 13    Last Pain: There were no vitals filed for this visit.               EWELL,CHARLES L

## 2015-06-06 NOTE — H&P (Signed)
Patient interval history reviewed.  Patient examined again.  There has been no change from documented H/P dated 05/08/15 (scanned into chart from our office) except as documented above.  Assessment:  1.  Esophageal varices.  Plan:  1.  Endoscopy to ensure obliteration of varices. 2.  Risks (bleeding, infection, bowel perforation that could require surgery, sedation-related changes in cardiopulmonary systems), benefits (identification and possible treatment of source of symptoms, exclusion of certain causes of symptoms), and alternatives (watchful waiting, radiographic imaging studies, empiric medical treatment) of upper endoscopy (EGD) were explained to patient/family in detail and patient wishes to proceed.

## 2015-06-07 ENCOUNTER — Other Ambulatory Visit (INDEPENDENT_AMBULATORY_CARE_PROVIDER_SITE_OTHER): Payer: Medicare Other | Admitting: *Deleted

## 2015-06-07 DIAGNOSIS — I2510 Atherosclerotic heart disease of native coronary artery without angina pectoris: Secondary | ICD-10-CM

## 2015-06-07 DIAGNOSIS — E7800 Pure hypercholesterolemia, unspecified: Secondary | ICD-10-CM

## 2015-06-07 DIAGNOSIS — K7689 Other specified diseases of liver: Secondary | ICD-10-CM | POA: Diagnosis not present

## 2015-06-07 DIAGNOSIS — K769 Liver disease, unspecified: Secondary | ICD-10-CM

## 2015-06-07 DIAGNOSIS — R9439 Abnormal result of other cardiovascular function study: Secondary | ICD-10-CM | POA: Diagnosis not present

## 2015-06-07 DIAGNOSIS — K745 Biliary cirrhosis, unspecified: Secondary | ICD-10-CM | POA: Diagnosis not present

## 2015-06-07 DIAGNOSIS — I2583 Coronary atherosclerosis due to lipid rich plaque: Secondary | ICD-10-CM | POA: Diagnosis not present

## 2015-06-07 LAB — BASIC METABOLIC PANEL
BUN: 19 mg/dL (ref 7–25)
CO2: 27 mmol/L (ref 20–31)
Calcium: 9.4 mg/dL (ref 8.6–10.4)
Chloride: 102 mmol/L (ref 98–110)
Creat: 1.03 mg/dL — ABNORMAL HIGH (ref 0.50–0.99)
Glucose, Bld: 139 mg/dL — ABNORMAL HIGH (ref 65–99)
Potassium: 3.8 mmol/L (ref 3.5–5.3)
Sodium: 137 mmol/L (ref 135–146)

## 2015-06-07 NOTE — Addendum Note (Signed)
Addended by: Eulis Foster on: 06/07/2015 07:52 AM   Modules accepted: Orders

## 2015-06-11 ENCOUNTER — Encounter (HOSPITAL_COMMUNITY): Payer: Self-pay | Admitting: Gastroenterology

## 2015-07-10 DIAGNOSIS — I8500 Esophageal varices without bleeding: Secondary | ICD-10-CM | POA: Diagnosis not present

## 2015-07-10 DIAGNOSIS — K7290 Hepatic failure, unspecified without coma: Secondary | ICD-10-CM | POA: Diagnosis not present

## 2015-07-10 DIAGNOSIS — G4700 Insomnia, unspecified: Secondary | ICD-10-CM | POA: Diagnosis not present

## 2015-07-10 DIAGNOSIS — K7030 Alcoholic cirrhosis of liver without ascites: Secondary | ICD-10-CM | POA: Diagnosis not present

## 2015-07-24 DIAGNOSIS — R938 Abnormal findings on diagnostic imaging of other specified body structures: Secondary | ICD-10-CM | POA: Diagnosis not present

## 2015-07-24 DIAGNOSIS — I8511 Secondary esophageal varices with bleeding: Secondary | ICD-10-CM | POA: Diagnosis not present

## 2015-07-24 DIAGNOSIS — K7031 Alcoholic cirrhosis of liver with ascites: Secondary | ICD-10-CM | POA: Diagnosis not present

## 2015-08-16 DIAGNOSIS — M545 Low back pain: Secondary | ICD-10-CM | POA: Diagnosis not present

## 2015-08-23 DIAGNOSIS — M545 Low back pain: Secondary | ICD-10-CM | POA: Diagnosis not present

## 2015-08-28 ENCOUNTER — Other Ambulatory Visit: Payer: Self-pay | Admitting: Obstetrics & Gynecology

## 2015-08-28 ENCOUNTER — Other Ambulatory Visit (HOSPITAL_COMMUNITY)
Admission: RE | Admit: 2015-08-28 | Discharge: 2015-08-28 | Disposition: A | Discharge status: Home / Self Care | Payer: Medicare Other | Source: Ambulatory Visit | Attending: Obstetrics and Gynecology | Admitting: Obstetrics and Gynecology

## 2015-08-28 DIAGNOSIS — Z124 Encounter for screening for malignant neoplasm of cervix: Secondary | ICD-10-CM | POA: Diagnosis not present

## 2015-08-28 DIAGNOSIS — R87619 Unspecified abnormal cytological findings in specimens from cervix uteri: Secondary | ICD-10-CM | POA: Diagnosis not present

## 2015-08-29 LAB — CYTOLOGY - PAP

## 2015-08-30 DIAGNOSIS — M545 Low back pain: Secondary | ICD-10-CM | POA: Diagnosis not present

## 2015-09-11 ENCOUNTER — Telehealth: Payer: Self-pay | Admitting: Cardiology

## 2015-09-11 ENCOUNTER — Telehealth: Payer: Self-pay | Admitting: *Deleted

## 2015-09-11 DIAGNOSIS — Z79899 Other long term (current) drug therapy: Secondary | ICD-10-CM

## 2015-09-11 DIAGNOSIS — E7800 Pure hypercholesterolemia, unspecified: Secondary | ICD-10-CM

## 2015-09-11 DIAGNOSIS — I2583 Coronary atherosclerosis due to lipid rich plaque: Principal | ICD-10-CM

## 2015-09-11 DIAGNOSIS — I2510 Atherosclerotic heart disease of native coronary artery without angina pectoris: Secondary | ICD-10-CM

## 2015-09-11 NOTE — Telephone Encounter (Signed)
Pt walked into the office today, demanding to speak with Dr Meda Coffee or myself.  Walk-in pt form was filled out by the pt, in regards to her needs.  On the walk-in form, the pt demanded to talk about "something important." Pt states "its an emergency."  Pt states "its a medication problem." Pt request that we call back ASAP, but can only call before 1 pm, and call after 3 pm.  Tried contacting the pt x 2 and no answer, with no vm setup.  Will follow-up with the pt accordingly.

## 2015-09-11 NOTE — Telephone Encounter (Signed)
Pt called back to discuss taking Nadolol and lasix.   Pt states that Dr Meda Coffee "needs to prescribe lasix daily and needs to take me off Nadolol." Pt states that "Nadolol is causing me to gain over 40 lbs in 6 months." Pt states "it also dries my skin out." Pt voiced no cardiac issues like sob, DOE, LEE, palpitations, dizziness, pre-syncopal or syncopal issues.   Pt voiced that she does have swelling in her abdomen area.  Asked the pt if she has notified GI Dr Paulita Fujita of this, for this could be ascites caused by cirrhosis of the liver.  Pt has an extensive GI medical Hx. Pt states "this is not ascites, for I know when I have that."  Pt states "I was checked for ascites back in Feb, and there was no fluid in my abdomen." Pt correlates her abdominal swelling, with taking Nadolol.  Informed the pt that Dr Meda Coffee is currently out of the office the rest of the day, but I will route this message to her for further review and recommendation, and follow-up with the pt thereafter.  Advised the pt to contact her GI Doctor, Dr Paulita Fujita asap, to inform him of her current weight gain of 40 lbs in 6 months and noted abdominal swelling only.  Advised the pt that she should request for Dr Paulita Fujita to see her for follow-up.  Informed the pt that she could be experiencing ascites again, and will need her liver enzymes rechecked.  Pt verbalized understanding and agrees with this plan.

## 2015-09-11 NOTE — Telephone Encounter (Signed)
Walk in pt form-pt needs to speak with Ivy-

## 2015-09-12 DIAGNOSIS — Z79899 Other long term (current) drug therapy: Secondary | ICD-10-CM | POA: Insufficient documentation

## 2015-09-12 MED ORDER — FUROSEMIDE 40 MG PO TABS: 40.0000 mg | ORAL_TABLET | Freq: Every day | ORAL | Status: DC

## 2015-09-12 NOTE — Telephone Encounter (Signed)
Please discontinue nadolol, start lasix 40 mg po daily and have her follow wth a PA in 2 weeks. She will need BMP at that time as well.

## 2015-09-12 NOTE — Telephone Encounter (Signed)
Notified the pt that per Dr Meda Coffee, she recommends that we discontinue her nadolol, and increase her lasix to 40 mg po daily, and have her come in 2-3 weeks to see an Extender with lab same day (to check a bmet). Confirmed the pharmacy of choice with the pt.  Pt is scheduled to see Nell Range PA-C on 10/04/15 at 1100.  Pt is aware of lab appt same day she see's PA.  Lab appt made for 6/29 to check a bmet.  Pt verbalized understanding and agrees with this plan.

## 2015-09-12 NOTE — Addendum Note (Signed)
Addended by: Nuala Alpha on: 09/12/2015 03:13 PM   Modules accepted: Orders, Medications

## 2015-09-13 ENCOUNTER — Other Ambulatory Visit: Payer: Self-pay | Admitting: Surgery

## 2015-09-13 DIAGNOSIS — K429 Umbilical hernia without obstruction or gangrene: Secondary | ICD-10-CM | POA: Diagnosis not present

## 2015-09-13 DIAGNOSIS — M545 Low back pain: Secondary | ICD-10-CM | POA: Diagnosis not present

## 2015-09-14 ENCOUNTER — Other Ambulatory Visit: Payer: Self-pay | Admitting: Surgery

## 2015-09-14 DIAGNOSIS — K429 Umbilical hernia without obstruction or gangrene: Secondary | ICD-10-CM

## 2015-09-18 DIAGNOSIS — M545 Low back pain: Secondary | ICD-10-CM | POA: Diagnosis not present

## 2015-09-21 ENCOUNTER — Ambulatory Visit
Admission: RE | Admit: 2015-09-21 | Discharge: 2015-09-21 | Disposition: A | Discharge status: Home / Self Care | Payer: Medicare Other | Source: Ambulatory Visit | Attending: Surgery | Admitting: Surgery

## 2015-09-21 DIAGNOSIS — K429 Umbilical hernia without obstruction or gangrene: Secondary | ICD-10-CM | POA: Diagnosis not present

## 2015-09-25 DIAGNOSIS — M545 Low back pain: Secondary | ICD-10-CM | POA: Diagnosis not present

## 2015-10-01 ENCOUNTER — Telehealth: Payer: Self-pay | Admitting: Cardiology

## 2015-10-01 NOTE — Telephone Encounter (Signed)
The pt is advised that she is scheduled to see Nell Range, PA-c on 6/29 at 11 am. She is requesting to have her lab work drawn prior to apt day so she and Joellen Jersey can discuss at her f/u. Her labs are now scheduled to be drawn on 6/27 and she is in agreement with date and time of her lab work.

## 2015-10-01 NOTE — Telephone Encounter (Signed)
F/u message  Pt calling RN back about new time for appt on 6/29. please call back to discuss

## 2015-10-02 ENCOUNTER — Other Ambulatory Visit (INDEPENDENT_AMBULATORY_CARE_PROVIDER_SITE_OTHER): Payer: Medicare Other

## 2015-10-02 DIAGNOSIS — M545 Low back pain: Secondary | ICD-10-CM | POA: Diagnosis not present

## 2015-10-02 DIAGNOSIS — I2510 Atherosclerotic heart disease of native coronary artery without angina pectoris: Secondary | ICD-10-CM | POA: Diagnosis not present

## 2015-10-02 DIAGNOSIS — Z79899 Other long term (current) drug therapy: Secondary | ICD-10-CM

## 2015-10-02 DIAGNOSIS — I2583 Coronary atherosclerosis due to lipid rich plaque: Principal | ICD-10-CM

## 2015-10-02 DIAGNOSIS — E7800 Pure hypercholesterolemia, unspecified: Secondary | ICD-10-CM | POA: Diagnosis not present

## 2015-10-02 LAB — BASIC METABOLIC PANEL
BUN: 12 mg/dL (ref 7–25)
CO2: 26 mmol/L (ref 20–31)
Calcium: 9.2 mg/dL (ref 8.6–10.4)
Chloride: 101 mmol/L (ref 98–110)
Creat: 1.2 mg/dL — ABNORMAL HIGH (ref 0.50–0.99)
Glucose, Bld: 107 mg/dL — ABNORMAL HIGH (ref 65–99)
Potassium: 3.8 mmol/L (ref 3.5–5.3)
Sodium: 138 mmol/L (ref 135–146)

## 2015-10-02 NOTE — Progress Notes (Signed)
Cardiology Office Note    Date:  10/04/2015   ID:  GENESSIS Barr, DOB 05-30-1945, MRN [id number]  PCP:  Landry Dyke, MD  Cardiologist:  Dr. Meda Coffee  CC: Abdominal swelling and weight gain   History of Present Illness:  Nicole Barr is a 70 y.o. female with a history of alcoholic cirrhosis being evaluated for transplant, esophageal varices, non obst CAD and carotid artery disease s/p R CEA who presents to clinic for evaluation of weight gain and abdominal distension.   She has a h/o alcoholic cirrhosis (dx [id number]) with esophageal varices, ascites and hepatic encephalopathy evaluated at [facility] for possible transplant and found not to be a candidate due to a positive stress test. As part of pre -transplant work up she underwent stress echocardiogram with positive ECG portion but negative echo portion. 2D ECHO 08/2014 showed normal EF, normal diastolic function, trivial TR, and trivial pericardial effusion.   She saw Dr. Meda Coffee in 09/2014 fo further evaluation. She described sx of ortho stasis and lethargy prohibiting her from exercise. Her nadolol was decreased to 10mg  BID. A cardiac CTA was ordered vs cath due to high risk of bleeding from cirrhosis complications.   She underwent a coronary CTA 10/2014 that showed 50-69% stenosis and in the proximal LAD, calcium score of 335. Because of her cirrhosis, she wasn't started on any statins. Per Dr. Francesca Oman last note, she was not a transplant candidate at that time as her MALT score was 9.  She saw Dr. Meda Coffee last in 03/2015. She complained of gaining weight and increased abdominal girth at that time. Weight was 162 lbs. Dr. Meda Coffee recommended that she use Lasix/spironolactone TID (instead of BID) x 1 week. She was also on nadolol 10mg  BID. Noted that her orthostasis and bradycardia had improved.   Review of phone notes reveal that she walked in the office demanding to be seen emergently for a reported weight gain of 40 lbs and  worsening abdominal distension different than her normal ascites. She was sure it was related to her nadolol. Dr. Meda Coffee recommended discontinuing nadolol and increasing lasix to 40mg  daily with a BMET in 2-3 weeks and APP f/u.   Labs on 10/02/15 showed creat bumped 1.03--> 1.20. She has been taking spiro 50mg  TID. She has been taking lasix 40mg  in am and 20mg  in PM. Mad that these directions werent more clear   Today she presents to clinic for follow up. She has lost about 5 lbs off the nadolol but still thinks he has 25 lbs to go. According to our weights in the office her weights have been 162 lbs (04/2015), 165lbs (06/2015) and 165 lbs today. She has many complaints. She is difficult to follow. Apparently, many previous clinic visits she presented intoxicated with alcohol in a water bottle. Today she does not appear intoxicated. She denies ever drinking heavily. She denies any current drinking at this time. She rambles about previous mitral valve prolapse that "everyone just forgot about." I reviewed last echo from [place] which called mitral valve normal. I offered her another echo here but she said no "it is too expensive and i am too old." She wants to know how to get rid of all this extra weight. She thinks the extra fluid from her heart failure has turned to regular fat. She said Dr. Meda Coffee said she had CHF but i dont see any mention of this in her previous note. She states Dr. Meda Coffee probably just didn't want to add it  to the chart? No LE edema, orthopnea or PND. No chest pain. No dizziness or syncope.   Past Medical History  Diagnosis Date  . GERD (gastroesophageal reflux disease)   . Cirrhosis ([place])   . Alcoholic cirrhosis ([place])   . Ascites   . Endometriosis   . Liver cyst     Past Surgical History  Procedure Laterality Date  . Carotid endarterectomy Right 2008  . Tonsillectomy      age 54  . Oophrectomy Left 1989  . Laminectomy  1989    lumbar 4-5  . Laparoscopy  1989    adhesions  .  Lapaendectomy  yrs ago  . Hemorroidectomy    . Egd with banding  2016    with bleeding after wards done at [facility]  . Esophagogastroduodenoscopy (egd) with propofol N/A 05/02/2015    Procedure: ESOPHAGOGASTRODUODENOSCOPY (EGD) WITH PROPOFOL;  Surgeon: Arta Silence, MD;  Location: [facility] ENDOSCOPY;  Service: Endoscopy;  Laterality: N/A;  . Gastric varices banding N/A 05/02/2015    Procedure: GASTRIC VARICES BANDING;  Surgeon: Arta Silence, MD;  Location: [facility] ENDOSCOPY;  Service: Endoscopy;  Laterality: N/A;  . Esophagogastroduodenoscopy (egd) with propofol N/A 06/06/2015    Procedure: ESOPHAGOGASTRODUODENOSCOPY (EGD) WITH PROPOFOL;  Surgeon: Arta Silence, MD;  Location: [facility] ENDOSCOPY;  Service: Endoscopy;  Laterality: N/A;    Current Medications: Outpatient Prescriptions Prior to Visit  Medication Sig Dispense Refill  . lactulose (CHRONULAC) 10 GM/15ML solution 3-4 tablespoons daily by mouth    . Multiple Vitamins-Minerals (MULTIVITAMIN WITH MINERALS) tablet Take 1 tablet by mouth daily.    . rifaximin (XIFAXAN) 550 MG TABS tablet Take 550 mg by mouth 2 (two) times daily.    . Vitamin D, Ergocalciferol, (DRISDOL) 50000 units CAPS capsule Take 50,000 Units by mouth once a week.  6  . furosemide (LASIX) 40 MG tablet Take 1 tablet (40 mg total) by mouth daily. 90 tablet 3  . morphine 20 MG/5ML solution Take 0.6 mLs (2.4 mg total) by mouth every 3 (three) hours as needed for pain. 10 mL 0  . spironolactone (ALDACTONE) 50 MG tablet Take 1 tablet (50 mg total) by mouth 3 (three) times daily.     No facility-administered medications prior to visit.     Allergies:   Carvedilol   Social History   Social History  . Marital Status: Single    Spouse Name: N/A  . Number of Children: N/A  . Years of Education: N/A   Social History Main Topics  . Smoking status: Former Smoker    Start date: 04/17/1968    Quit date: 04/17/1973  . Smokeless tobacco: Never Used  . Alcohol Use: No     Comment:  currently states she is not drinking but states she usually had 2 glasses of wine per evening  . Drug Use: No  . Sexual Activity: Not Asked   Other Topics Concern  . None   Social History Narrative     Family History:  The patient's family history includes Cancer in her mother; Diabetes in her mother; Heart failure in her father and maternal grandmother; Hyperlipidemia in her mother; Hypertension in her mother; Lupus in her mother; Vitiligo in her mother.     ROS:   Please see the history of present illness.    ROS  All other systems reviewed and are negative.   PHYSICAL EXAM:   VS:  BP 116/70 mmHg  Pulse 80  Ht 5\' 8"  (1.727 m)  Wt 165 lb (74.844 kg)  BMI 25.09 kg/m2  SpO2 97%   GEN: Well nourished, well developed, in no acute distress HEENT: normal Neck: no JVD, carotid bruits, or masses Cardiac: RRR; no murmurs, rubs, or gallops,no edema  Respiratory:  clear to auscultation bilaterally, normal work of breathing GI: soft, nontender, nondistended, + BS MS: no deformity or atrophy Skin: warm and dry, no rash Neuro:  Alert and Oriented x 3, Strength and sensation are intact Psych: euthymic mood, full affect  Wt Readings from Last 3 Encounters:  10/04/15 165 lb (74.844 kg)  06/06/15 165 lb (74.844 kg)  05/02/15 162 lb (73.483 kg)      Studies/Labs Reviewed:   EKG:  EKG is NOT ordered today.    Recent Labs: 04/26/2015: ALT 22; Hemoglobin 12.1 10/02/2015: BUN 12; Creat 1.20*; Potassium 3.8; Sodium 138   Lipid Panel No results found for: CHOL, TRIG, HDL, CHOLHDL, VLDL, LDLCALC, LDLDIRECT  Additional studies/ records that were reviewed today include:  Coronary CTA 10/25/2014 IMPRESSION: 1. Coronary calcium score of 335. This was 63 percentile for age and sex matched control. 2. Normal coronary origin. There is right and left co-dominance. 3. There is moderate non-obstructive CAD in the proximal LAD. Diffuse calcified plaque. An aggressive risk factor modification  is recommended.  2D ECHO 08/2014 (scanned from [facility]) showed normal EF, normal diastolic function, trivial TR, and trivial pericardial effusion.  ASSESSMENT & PLAN:   Hx of positive stress echocardiogram: this was done for pre op work up for possible liver transplant. A subsequent coronary CT in 10/2014 showed Coronary calcium score of 335 that was 20 percentile for age and sex matched control. There was moderate non-obstructive CAD in the proximal LAD. Diffuse calcified plaque. She wasn't started on statins as she has known cirrhosis with encephalopathy. She is advised about healthy diet that she is compliant with and with exercise.  Abdominal distention/weight gain: no objective evidence of weight gain or volume overload. Weights have been stable by our scales since 04/2015. Will go down to lasix 40mg  daily in the AM and spiro 50mg  BID (she was taking not as prescribed before). Abdomen does not appear to be more then mildly distended.   Carotid artery diease s/p CEA: stable.    Medication Adjustments/Labs and Tests Ordered: Current medicines are reviewed at length with the patient today.  Concerns regarding medicines are outlined above.  Medication changes, Labs and Tests ordered today are listed in the Patient Instructions below. Patient Instructions  Medication Instructions:  Your physician recommends that you continue on your current medications as directed. Please refer to the Current Medication list given to you today.  Labwork: None ordered  Testing/Procedures: None ordered  Follow-Up: .Your physician recommends that you schedule a follow-up appointment in: SEE DR. Meda Coffee AS PLANNED    Any Other Special Instructions Will Be Listed Below (If Applicable).   If you need a refill on your cardiac medications before your next appointment, please call your pharmacy.       Mable Fill, PA-C  10/04/2015 11:29 AM    [facility] [address], [place], [place]  [address] Phone: [phone]; Fax: [phone]

## 2015-10-04 ENCOUNTER — Encounter: Payer: Self-pay | Admitting: Physician Assistant

## 2015-10-04 ENCOUNTER — Ambulatory Visit (INDEPENDENT_AMBULATORY_CARE_PROVIDER_SITE_OTHER): Payer: Medicare Other | Admitting: Physician Assistant

## 2015-10-04 ENCOUNTER — Other Ambulatory Visit: Payer: Medicare Other

## 2015-10-04 VITALS — BP 116/70 | HR 80 | Ht 68.0 in | Wt 165.0 lb

## 2015-10-04 DIAGNOSIS — R635 Abnormal weight gain: Secondary | ICD-10-CM

## 2015-10-04 DIAGNOSIS — I2510 Atherosclerotic heart disease of native coronary artery without angina pectoris: Secondary | ICD-10-CM

## 2015-10-04 NOTE — Patient Instructions (Addendum)
Medication Instructions:  Your physician has recommended you make the following change in your medication:  1.  DECREASE the Lasix to 20 mg taking 2 tablets in the a.m. 2.  DECREASE the Spironolactone 50 mg taking 1 tablet twice a day  Labwork: None ordered  Testing/Procedures: None ordered  Follow-Up: .Your physician recommends that you schedule a follow-up appointment in: SEE DR. Meda Coffee AS PLANNED    Any Other Special Instructions Will Be Listed Below (If Applicable).   If you need a refill on your cardiac medications before your next appointment, please call your pharmacy.

## 2015-10-05 DIAGNOSIS — L853 Xerosis cutis: Secondary | ICD-10-CM | POA: Diagnosis not present

## 2015-10-05 DIAGNOSIS — L820 Inflamed seborrheic keratosis: Secondary | ICD-10-CM | POA: Diagnosis not present

## 2015-10-05 DIAGNOSIS — L570 Actinic keratosis: Secondary | ICD-10-CM | POA: Diagnosis not present

## 2015-10-10 DIAGNOSIS — M545 Low back pain: Secondary | ICD-10-CM | POA: Diagnosis not present

## 2015-10-18 ENCOUNTER — Other Ambulatory Visit: Payer: Self-pay | Admitting: Gastroenterology

## 2015-10-18 DIAGNOSIS — K7030 Alcoholic cirrhosis of liver without ascites: Secondary | ICD-10-CM | POA: Diagnosis not present

## 2015-10-18 DIAGNOSIS — K7460 Unspecified cirrhosis of liver: Secondary | ICD-10-CM

## 2015-10-18 DIAGNOSIS — R188 Other ascites: Secondary | ICD-10-CM

## 2015-10-18 DIAGNOSIS — M545 Low back pain: Secondary | ICD-10-CM | POA: Diagnosis not present

## 2015-10-18 DIAGNOSIS — K429 Umbilical hernia without obstruction or gangrene: Secondary | ICD-10-CM | POA: Diagnosis not present

## 2015-10-18 DIAGNOSIS — I8500 Esophageal varices without bleeding: Secondary | ICD-10-CM | POA: Diagnosis not present

## 2015-10-19 DIAGNOSIS — K7030 Alcoholic cirrhosis of liver without ascites: Secondary | ICD-10-CM | POA: Diagnosis not present

## 2015-10-23 DIAGNOSIS — M545 Low back pain: Secondary | ICD-10-CM | POA: Diagnosis not present

## 2015-10-25 ENCOUNTER — Ambulatory Visit
Admission: RE | Admit: 2015-10-25 | Discharge: 2015-10-25 | Disposition: A | Discharge status: Home / Self Care | Payer: Medicare Other | Source: Ambulatory Visit | Attending: Gastroenterology | Admitting: Gastroenterology

## 2015-10-25 DIAGNOSIS — K7460 Unspecified cirrhosis of liver: Secondary | ICD-10-CM

## 2015-10-25 DIAGNOSIS — R188 Other ascites: Secondary | ICD-10-CM

## 2015-10-25 DIAGNOSIS — L820 Inflamed seborrheic keratosis: Secondary | ICD-10-CM | POA: Diagnosis not present

## 2015-10-29 ENCOUNTER — Other Ambulatory Visit: Payer: Medicare Other | Admitting: *Deleted

## 2015-10-29 ENCOUNTER — Encounter (INDEPENDENT_AMBULATORY_CARE_PROVIDER_SITE_OTHER): Payer: Self-pay

## 2015-10-29 DIAGNOSIS — I2510 Atherosclerotic heart disease of native coronary artery without angina pectoris: Secondary | ICD-10-CM | POA: Diagnosis not present

## 2015-10-29 DIAGNOSIS — K745 Biliary cirrhosis, unspecified: Secondary | ICD-10-CM

## 2015-10-29 DIAGNOSIS — I2583 Coronary atherosclerosis due to lipid rich plaque: Secondary | ICD-10-CM

## 2015-10-29 DIAGNOSIS — E7800 Pure hypercholesterolemia, unspecified: Secondary | ICD-10-CM | POA: Diagnosis not present

## 2015-10-29 LAB — COMPREHENSIVE METABOLIC PANEL
ALT: 15 U/L (ref 6–29)
AST: 23 U/L (ref 10–35)
Albumin: 4.2 g/dL (ref 3.6–5.1)
Alkaline Phosphatase: 79 U/L (ref 33–130)
BUN: 10 mg/dL (ref 7–25)
CO2: 28 mmol/L (ref 20–31)
Calcium: 9.4 mg/dL (ref 8.6–10.4)
Chloride: 102 mmol/L (ref 98–110)
Creat: 0.99 mg/dL (ref 0.50–0.99)
Glucose, Bld: 99 mg/dL (ref 65–99)
Potassium: 3.9 mmol/L (ref 3.5–5.3)
Sodium: 138 mmol/L (ref 135–146)
Total Bilirubin: 1.1 mg/dL (ref 0.2–1.2)
Total Protein: 6.4 g/dL (ref 6.1–8.1)

## 2015-11-07 ENCOUNTER — Ambulatory Visit (INDEPENDENT_AMBULATORY_CARE_PROVIDER_SITE_OTHER)
Admission: RE | Admit: 2015-11-07 | Discharge: 2015-11-07 | Disposition: A | Discharge status: Home / Self Care | Payer: Medicare Other | Source: Ambulatory Visit | Attending: Cardiology | Admitting: Cardiology

## 2015-11-07 DIAGNOSIS — R911 Solitary pulmonary nodule: Secondary | ICD-10-CM

## 2015-11-07 MED ORDER — IOPAMIDOL (ISOVUE-300) INJECTION 61%
80.0000 mL | Freq: Once | INTRAVENOUS | Status: AC | PRN
  Administered 2015-11-07: 80 mL via INTRAVENOUS

## 2015-11-12 ENCOUNTER — Telehealth: Payer: Self-pay | Admitting: *Deleted

## 2015-11-12 ENCOUNTER — Encounter: Payer: Self-pay | Admitting: *Deleted

## 2015-11-12 DIAGNOSIS — R911 Solitary pulmonary nodule: Secondary | ICD-10-CM

## 2015-11-12 NOTE — Progress Notes (Unsigned)
Notified the pt of her lab results being normal per Dr Meda Coffee.  Pt request a copy to be mailed to her confirmed addressed.  Pt verbalized understanding.

## 2015-11-12 NOTE — Telephone Encounter (Signed)
Notified the pt of her lab results being normal per Dr Meda Coffee.  Pt request a copy to be mailed to her confirmed addressed.  Pt verbalized understanding.   Also informed the pt that per Dr Meda Coffee, her chest CT showed that she has a 6 mm nodule that has not changed in the intervening year, but it appears somewhat irregularly marginated, therefore one additional followup CT the chest in 1 year is recommended to confirm stability over 2 Years.  Dr Meda Coffee would like a CT chest W contrast to be ordered. Informed the pt that I will place the order in the system and have one of our [facility] schedulers call her back to arrange this image for one year out.  Pt verbalized understanding and agrees with this plan.

## 2015-11-12 NOTE — Telephone Encounter (Signed)
-----   Message from Dorothy Spark, MD sent at 11/12/2015  9:11 AM EDT ----- The 6 mm nodule has not changed in the intervening year, but it appears somewhat irregularly Marginated, therefore one additional followup CT the chest in 1 year is recommended to confirm stability over 2 Years.  Ivy, please schedule a chest CT with contrast in 1 year.

## 2015-11-13 ENCOUNTER — Telehealth: Payer: Self-pay | Admitting: *Deleted

## 2015-11-13 DIAGNOSIS — R911 Solitary pulmonary nodule: Secondary | ICD-10-CM

## 2015-11-13 DIAGNOSIS — Z0189 Encounter for other specified special examinations: Secondary | ICD-10-CM

## 2015-11-13 NOTE — Telephone Encounter (Signed)
Order placed for a BMET to be done on the pt 6 weeks or less, prior to her follow-up Chest CT with contrast on the pt in one year.  Per CT dept, protocol for bmet prior to repeat chest ct w contrast must be done.  Will inform [facility] scheduling to recall this lab to be done prior to the pts ct chest w contrast in one year.

## 2015-11-13 NOTE — Telephone Encounter (Signed)
-----   Message from Osvaldo Shipper, [place] sent at 11/13/2015  9:03 AM EDT ----- Regarding: RE: 11/13/15   STACY, WILL YOU CALL THIS PATIENT TO [place].HER CT ONE YR.OUT.   THANKS Ivy Can you put in an order for labs for future?   Thanks stacey  ----- Message ----- From: Philbert Riser Sent: 11/13/2015   8:29 AM To: Osvaldo Shipper, [place], Nuala Alpha, LPN Subject: [id number]   STACY, WILL YOU CALL THIS PATIENT T#

## 2015-12-13 ENCOUNTER — Other Ambulatory Visit: Payer: Self-pay

## 2015-12-14 DIAGNOSIS — Z23 Encounter for immunization: Secondary | ICD-10-CM | POA: Diagnosis not present

## 2015-12-25 DIAGNOSIS — H35033 Hypertensive retinopathy, bilateral: Secondary | ICD-10-CM | POA: Diagnosis not present

## 2016-01-04 DIAGNOSIS — Z23 Encounter for immunization: Secondary | ICD-10-CM | POA: Diagnosis not present

## 2016-02-05 DIAGNOSIS — R229 Localized swelling, mass and lump, unspecified: Secondary | ICD-10-CM | POA: Diagnosis not present

## 2016-02-05 DIAGNOSIS — D485 Neoplasm of uncertain behavior of skin: Secondary | ICD-10-CM | POA: Diagnosis not present

## 2016-02-05 DIAGNOSIS — B079 Viral wart, unspecified: Secondary | ICD-10-CM | POA: Diagnosis not present

## 2016-02-05 DIAGNOSIS — Z23 Encounter for immunization: Secondary | ICD-10-CM | POA: Diagnosis not present

## 2016-02-14 DIAGNOSIS — M4727 Other spondylosis with radiculopathy, lumbosacral region: Secondary | ICD-10-CM | POA: Diagnosis not present

## 2016-02-14 DIAGNOSIS — M4726 Other spondylosis with radiculopathy, lumbar region: Secondary | ICD-10-CM | POA: Diagnosis not present

## 2016-02-14 DIAGNOSIS — R200 Anesthesia of skin: Secondary | ICD-10-CM | POA: Diagnosis not present

## 2016-02-14 DIAGNOSIS — M4807 Spinal stenosis, lumbosacral region: Secondary | ICD-10-CM | POA: Diagnosis not present

## 2016-02-14 DIAGNOSIS — G8929 Other chronic pain: Secondary | ICD-10-CM | POA: Diagnosis not present

## 2016-02-14 DIAGNOSIS — M5136 Other intervertebral disc degeneration, lumbar region: Secondary | ICD-10-CM | POA: Diagnosis not present

## 2016-02-14 DIAGNOSIS — M4856XA Collapsed vertebra, not elsewhere classified, lumbar region, initial encounter for fracture: Secondary | ICD-10-CM | POA: Diagnosis not present

## 2016-02-14 DIAGNOSIS — R202 Paresthesia of skin: Secondary | ICD-10-CM | POA: Diagnosis not present

## 2016-02-14 DIAGNOSIS — M545 Low back pain: Secondary | ICD-10-CM | POA: Diagnosis not present

## 2016-02-14 DIAGNOSIS — M48061 Spinal stenosis, lumbar region without neurogenic claudication: Secondary | ICD-10-CM | POA: Diagnosis not present

## 2016-05-12 ENCOUNTER — Other Ambulatory Visit: Payer: Self-pay | Admitting: Gastroenterology

## 2016-05-12 DIAGNOSIS — K7469 Other cirrhosis of liver: Secondary | ICD-10-CM

## 2016-05-26 ENCOUNTER — Ambulatory Visit
Admission: RE | Admit: 2016-05-26 | Discharge: 2016-05-26 | Disposition: A | Discharge status: Home / Self Care | Payer: Medicare Other | Source: Ambulatory Visit | Attending: Gastroenterology | Admitting: Gastroenterology

## 2016-05-26 DIAGNOSIS — K7460 Unspecified cirrhosis of liver: Secondary | ICD-10-CM | POA: Diagnosis not present

## 2016-05-26 DIAGNOSIS — K7469 Other cirrhosis of liver: Secondary | ICD-10-CM

## 2016-06-25 ENCOUNTER — Other Ambulatory Visit: Payer: Self-pay | Admitting: Family Medicine

## 2016-06-25 DIAGNOSIS — D485 Neoplasm of uncertain behavior of skin: Secondary | ICD-10-CM | POA: Diagnosis not present

## 2016-06-25 DIAGNOSIS — L821 Other seborrheic keratosis: Secondary | ICD-10-CM | POA: Diagnosis not present

## 2016-07-17 NOTE — Progress Notes (Signed)
[facility] [place], [place] Phone: [phone] Subjective:    I'm seeing this patient by the request  of:  Nicole Dyke, MD   CC: Back pain  [id number]  STEPHAN Barr is a 71 y.o. female coming in with complaint of neck pain. Patient has had back pain for quite some time. Going on over 20 years. Patient's in 1989 underwent a right-sided L4-L5 discectomy. Did have fairly good resolution of pain until 2015. Patient was having pain that seemed to be going down the posterior aspect of both legs. Patient started having this symptom of intermittent tingling in the feet that started become more persistent and constant. This is associated with severe intermittent back pain. Patient has gone to formal physical therapy as well. Patient is also being treated for cirrhosis.   patient did have an MRI and taken 04/03/2016. This isn't apparently visualized by me. Patient did have a Tarlov's cyst and T12-L1 as well as moderate foraminal narrowing diffusely. Patient does have moderate to severe facet degenerative changes at L4-L5 with moderate stenosis.  Past Medical History:  Diagnosis Date  . Alcoholic cirrhosis ([place])   . Ascites   . Cirrhosis ([place])   . Endometriosis   . GERD (gastroesophageal reflux disease)   . Liver cyst    Past Surgical History:  Procedure Laterality Date  . CAROTID ENDARTERECTOMY Right 2008  . egd with banding  2016   with bleeding [facility]  . ESOPHAGOGASTRODUODENOSCOPY (EGD) WITH PROPOFOL N/A 05/02/2015   Procedure: ESOPHAGOGASTRODUODENOSCOPY (EGD) WITH PROPOFOL;  Surgeon: Arta Silence, MD;  Location: [facility];  Service: Endoscopy;  Laterality: N/A;  . ESOPHAGOGASTRODUODENOSCOPY (EGD) WITH PROPOFOL N/A 06/06/2015   Procedure: ESOPHAGOGASTRODUODENOSCOPY (EGD) WITH PROPOFOL;  Surgeon: Arta Silence, MD;  Location: [facility];  Service: Endoscopy;  Laterality: N/A;  . GASTRIC VARICES  BANDING N/A 05/02/2015   Procedure: GASTRIC VARICES BANDING;  Surgeon: Arta Silence, MD;  [facility]: [facility];  Service: Endoscopy;  Laterality: N/A;  . HEMORROIDECTOMY    . LAMINECTOMY  1989   lumbar 4-5  . lapaendectomy  yrs ago  . LAPAROSCOPY  1989   adhesions  . oophrectomy Left 1989  . TONSILLECTOMY     age 89   Social History   Social History  . Marital status: Single    Spouse name: N/A  . Number of children: N/A  . Years of education: N/A   Social History Main Topics  . Smoking status: Former Smoker    Start date: 04/17/1968    Quit date: 04/17/1973  . Smokeless tobacco: Never Used  . Alcohol use No     Comment: currently states she is not drinking but states she usually had 2 glasses of wine per evening  . Drug use: No  . Sexual activity: Not Asked   Other Topics Concern  . None   Social History Narrative  . None   Allergies  Allergen Reactions  . Carvedilol Swelling    Other reaction(s): Hypertension (intolerance), Rapid Heart Rate   Family History  Problem Relation Age of Onset  . Diabetes Mother   . Cancer Mother   . Hypertension Mother   . Hyperlipidemia Mother   . Lupus Mother   . Vitiligo Mother   . Heart failure Father   . Heart failure Maternal Grandmother     Past medical history, social, surgical and family history all reviewed in electronic medical record.  No pertanent information unless  stated regarding to the chief complaint.   Review of Systems:Review of systems updated and as accurate as of 07/18/16  No headache, visual changes, nausea, vomiting, diarrhea, constipation, dizziness, abdominal pain, skin rash, fevers, chills, night sweats, weight loss, swollen lymph nodes, body aches, joint swelling, muscle aches, chest pain, shortness of breath, mood changes.   Objective  Blood pressure 110/62, pulse 62, height 5\' 6"  (1.676 m), weight 171 lb (77.6 kg). Systems examined below as of 07/18/16   General: No apparent distress alert and  oriented x3 mood and affect normal, dressed appropriately.  HEENT: Pupils equal, extraocular movements intact  Respiratory: Patient's speak in full sentences and does not appear short of breath  Cardiovascular: No lower extremity edema, non tender, no erythema  Skin: Warm dry intact with no signs of infection or rash on extremities or on axial skeleton.  Abdomen: Soft nontender  Neuro: Cranial nerves II through XII are intact, neurovascularly intact in all extremities with 2+ DTRs and 2+ pulses.  Lymph: No lymphadenopathy of posterior or anterior cervical chain or axillae bilaterally.  Gait normal with good balance and coordination.  MSK:  Non tender with full range of motion and good stability and symmetric strength and tone of shoulders, elbows, wrist, hip, knee and ankles bilaterally. Mild arthritic changes. Back Exam:  Inspection: Unremarkable  Motion: Flexion 45 deg, Extension 25 deg, Side Bending to 35 deg bilaterally,  Rotation to 35 deg bilaterally  SLR laying: Negative  XSLR laying: Negative  Palpable tenderness: Palpation of the paraspinal muscle which her.Marland Kitchen FABER: negative. Sensory change: Gross sensation intact to all lumbar and sacral dermatomes.  Reflexes: 2+ at both patellar tendons, 2+ at achilles tendons, Babinski's downgoing.  Strength at foot  Plantar-flexion: 5/5 Dorsi-flexion: 5/5 Eversion: 5/5 Inversion: 5/5  Leg strength  Quad: 5/5 Hamstring: 5/5 Hip flexor: 5/5 Hip abductors: 5/5  Gait unremarkable.   Foot exam shows the patient does have pes cavus bilaterally with over supination of the hindfoot. Patient has some mild hallux rigidus and severe breakdown of the transverse arch right greater than left. Patient does have very small amount of numbness over the fourth and fifth digits distally.     Procedure note 90240; 15 minutes spent for Therapeutic exercises as stated in above notes.  This included exercises focusing on stretching, strengthening, with  significant focus on eccentric aspects.  Exercises for the foot include:  Stretches to help lengthen the lower leg and plantar fascia areas Theraband exercises for the lower leg and ankle to help strengthen the surrounding area- dorsiflexion, plantarflexion, inversion, eversion Massage rolling on the plantar surface of the foot with a frozen bottle, tennis ball or golf ball Towel or marble pick-ups to strengthen the plantar surface of the foot Weight bearing exercises to increase balance and overall stability   Proper technique shown and discussed handout in great detail with ATC.  All questions were discussed and answered.   Impression and Recommendations:     This case required medical decision making of moderate complexity.      Note: This dictation was prepared with Dragon dictation along with smaller phrase technology. Any transcriptional errors that result from this process are unintentional.

## 2016-07-18 ENCOUNTER — Encounter: Payer: Self-pay | Admitting: Family Medicine

## 2016-07-18 ENCOUNTER — Ambulatory Visit (INDEPENDENT_AMBULATORY_CARE_PROVIDER_SITE_OTHER): Payer: Medicare Other | Admitting: Family Medicine

## 2016-07-18 ENCOUNTER — Encounter (INDEPENDENT_AMBULATORY_CARE_PROVIDER_SITE_OTHER): Payer: Self-pay

## 2016-07-18 ENCOUNTER — Other Ambulatory Visit (INDEPENDENT_AMBULATORY_CARE_PROVIDER_SITE_OTHER): Payer: Medicare Other

## 2016-07-18 VITALS — BP 110/62 | HR 62 | Ht 66.0 in | Wt 171.0 lb

## 2016-07-18 DIAGNOSIS — G629 Polyneuropathy, unspecified: Secondary | ICD-10-CM | POA: Diagnosis not present

## 2016-07-18 DIAGNOSIS — G621 Alcoholic polyneuropathy: Secondary | ICD-10-CM | POA: Diagnosis not present

## 2016-07-18 DIAGNOSIS — G609 Hereditary and idiopathic neuropathy, unspecified: Secondary | ICD-10-CM

## 2016-07-18 DIAGNOSIS — E611 Iron deficiency: Secondary | ICD-10-CM | POA: Diagnosis not present

## 2016-07-18 DIAGNOSIS — R5383 Other fatigue: Secondary | ICD-10-CM

## 2016-07-18 DIAGNOSIS — M216X9 Other acquired deformities of unspecified foot: Secondary | ICD-10-CM

## 2016-07-18 LAB — IRON: Iron: 108 ug/dL (ref 42–145)

## 2016-07-18 LAB — SEDIMENTATION RATE: Sed Rate: 33 mm/hr — ABNORMAL HIGH (ref 0–30)

## 2016-07-18 LAB — CALCIUM, IONIZED: Calcium, Ion: 5.3 mg/dL (ref 4.8–5.6)

## 2016-07-18 NOTE — Patient Instructions (Signed)
Good to see you.  For the foot avoid being barefoot at all times.  [employer] orthotics "total support" online would be great  Good shoes with rigid bottom.  Nicole [place], [place] or New balance greater then 700 Do not lace the last eye  pennsaid pinkie amount topically 2 times daily as needed.  I want to check some labs to make sure some things are not out of line.  Exercises 3 times a week.  See me again in 4 weeks and lets see how we are doing.

## 2016-07-18 NOTE — Assessment & Plan Note (Signed)
I believe the breakdown of transverse arch could be causing some nerve compression on the lateral aspect of the toes. More likely of the peripheral neuropathy is secondary to her history of alcohol abuse. Discussed with patient again at great length. Discussed icing regimen and home exercises. Patient will try these on a more regular basis. Patient come back and see me again in 4-6 weeks.

## 2016-07-18 NOTE — Assessment & Plan Note (Signed)
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 Discussed with patient at great length. I do want to get labs to rule out any other organic causes a could be contributing. Discussed with her that this could be a chronic problem. We discussed different supplementations over-the-counter that could be beneficial including vitamin D and B12. We discussed proper shoes. We discussed the breakdown of the transverse arch. Discussed over-the-counter orthotics. Discussed home exercises. She will try these different changes and come back and see me again in 4 weeks.

## 2016-07-21 LAB — PTH, INTACT AND CALCIUM
Calcium: 9.6 mg/dL (ref 8.6–10.4)
PTH: 31 pg/mL (ref 14–64)

## 2016-08-05 DIAGNOSIS — K7031 Alcoholic cirrhosis of liver with ascites: Secondary | ICD-10-CM | POA: Diagnosis not present

## 2016-08-11 ENCOUNTER — Other Ambulatory Visit: Payer: Self-pay | Admitting: Gastroenterology

## 2016-08-11 DIAGNOSIS — K7460 Unspecified cirrhosis of liver: Secondary | ICD-10-CM

## 2016-08-12 ENCOUNTER — Ambulatory Visit: Payer: Medicare Other | Admitting: Family Medicine

## 2016-08-12 ENCOUNTER — Ambulatory Visit
Admission: RE | Admit: 2016-08-12 | Discharge: 2016-08-12 | Disposition: A | Discharge status: Home / Self Care | Payer: Medicare Other | Source: Ambulatory Visit | Attending: Gastroenterology | Admitting: Gastroenterology

## 2016-08-12 DIAGNOSIS — K7460 Unspecified cirrhosis of liver: Secondary | ICD-10-CM

## 2016-08-12 DIAGNOSIS — K8020 Calculus of gallbladder without cholecystitis without obstruction: Secondary | ICD-10-CM | POA: Diagnosis not present

## 2016-08-14 ENCOUNTER — Ambulatory Visit: Payer: Medicare Other | Admitting: Family Medicine

## 2016-08-20 DIAGNOSIS — K429 Umbilical hernia without obstruction or gangrene: Secondary | ICD-10-CM | POA: Diagnosis not present

## 2016-08-20 DIAGNOSIS — I8500 Esophageal varices without bleeding: Secondary | ICD-10-CM | POA: Diagnosis not present

## 2016-08-20 DIAGNOSIS — Z1211 Encounter for screening for malignant neoplasm of colon: Secondary | ICD-10-CM | POA: Diagnosis not present

## 2016-08-20 DIAGNOSIS — K7460 Unspecified cirrhosis of liver: Secondary | ICD-10-CM | POA: Diagnosis not present

## 2016-08-22 NOTE — Progress Notes (Signed)
[facility] [place], [place] [address] Phone: [phone] Subjective:    I'm seeing this patient by the request  of:  Arta Silence, MD   CC: Back pain f/u  [id number]  INETA SINNING is a 71 y.o. female coming in with complaint of neck pain. Patient has had back pain for quite some time. Going on over 20 years. Patient's in 1989 underwent a right-sided L4-L5 discectomy. Did have fairly good resolution of pain until 2015.  Started having increasing pain. Saw me for more of a back pain. Patient was being treated more for peripheral neuropathy. Patient did not take any other medications. Did try to change shoes though. States that that did help some. Patient did get the over-the-counter orthotics as well. Did not continue the vitamins. Has been fairly noncompliant with the exercises    patient did have an MRI and taken 04/03/2016. This isn't apparently visualized by me. Patient did have a Tarlov's cyst and T12-L1 as well as moderate foraminal narrowing diffusely. Patient does have moderate to severe facet degenerative changes at L4-L5 with moderate stenosis.  Past Medical History:  Diagnosis Date  . Alcoholic cirrhosis ([place])   . Ascites   . Cirrhosis ([place])   . Endometriosis   . GERD (gastroesophageal reflux disease)   . Liver cyst    Past Surgical History:  Procedure Laterality Date  . CAROTID ENDARTERECTOMY Right 2008  . egd with banding  2016   with bleeding [facility]  . ESOPHAGOGASTRODUODENOSCOPY (EGD) WITH PROPOFOL N/A 05/02/2015   Procedure: ESOPHAGOGASTRODUODENOSCOPY (EGD) WITH PROPOFOL;  Surgeon: Arta Silence, MD;  [facility];  Service: Endoscopy;  Laterality: N/A;  . ESOPHAGOGASTRODUODENOSCOPY (EGD) WITH PROPOFOL N/A 06/06/2015   Procedure: ESOPHAGOGASTRODUODENOSCOPY (EGD) WITH PROPOFOL;  Surgeon: Arta Silence, MD;  [facility];  Service: Endoscopy;  Laterality: N/A;  . GASTRIC  VARICES BANDING N/A 05/02/2015   Procedure: GASTRIC VARICES BANDING;  Surgeon: Arta Silence, MD;  [facility];  Service: Endoscopy;  Laterality: N/A;  . HEMORROIDECTOMY    . LAMINECTOMY  1989   lumbar 4-5  . lapaendectomy  yrs ago  . LAPAROSCOPY  1989   adhesions  . oophrectomy Left 1989  . TONSILLECTOMY     age 81   Social History   Social History  . Marital status: Single    Spouse name: N/A  . Number of children: N/A  . Years of education: N/A   Social History Main Topics  . Smoking status: Former Smoker    Start date: 04/17/1968    Quit date: 04/17/1973  . Smokeless tobacco: Never Used  . Alcohol use No     Comment: currently states she is not drinking but states she usually had 2 glasses of wine per evening  . Drug use: No  . Sexual activity: Not Asked   Other Topics Concern  . None   Social History Narrative  . None   Allergies  Allergen Reactions  . Carvedilol Swelling    Other reaction(s): Hypertension (intolerance), Rapid Heart Rate   Family History  Problem Relation Age of Onset  . Diabetes Mother   . Cancer Mother   . Hypertension Mother   . Hyperlipidemia Mother   . Lupus Mother   . Vitiligo Mother   . Heart failure Father   . Heart failure Maternal Grandmother     Past medical history, social, surgical and family history all reviewed in electronic medical record.  No pertanent information unless stated regarding to the chief complaint.   Review of Systems: No headache, visual changes, nausea, vomiting, diarrhea, constipation, dizziness, abdominal pain, skin rash, fevers, chills, night sweats, weight loss, swollen lymph nodes, body aches, joint swelling, muscle aches, chest pain, shortness of breath, mood changes.   Objective  Blood pressure 112/72, pulse (!) 58, height 5\' 6"  (1.676 m), weight 166 lb (75.3 kg), SpO2 97 %.   Systems examined below as of 08/25/16 General: NAD A&O x3 mood, pupils dialated, mild slurred speech  HEENT:  Pupils equal, extraocular movements intact no nystagmus Respiratory: not short of breath at rest or with speaking Cardiovascular: No lower extremity edema, non tender Skin: Warm dry intact with no signs of infection or rash on extremities or on axial skeleton. Abdomen: Soft nontender, no masses Neuro: Cranial nerves  intact, neurovascularly intact in all extremities with 2+ DTRs and 2+ pulses. Lymph: No lymphadenopathy appreciated today  Gait normal with good balance and coordination.  MSK: Non tender with full range of motion and good stability and symmetric strength and tone of shoulders, elbows, wrist,  knee hips and ankles bilaterally. Mild arthritic changes  Back Exam:  Inspection: mild loss lordosis Motion: Flexion 45 deg, Extension 25 deg, Side Bending to 35 deg bilaterally,  Rotation to 35 deg bilaterally  SLR laying: Negative  XSLR laying: Negative  Palpable tenderness: tender to palpation in the paraspinal musculaturediffusely. FABER: negative. Sensory change: Gross sensation intact to all lumbar and sacral dermatomes.  Reflexes: 2+ at both patellar tendons, 2+ at achilles tendons, Babinski's downgoing.  Strength at foot  Plantar-flexion: 5/5 Dorsi-flexion: 5/5 Eversion: 5/5 Inversion: 5/5  Leg strength  Quad: 5/5 Hamstring: 5/5 Hip flexor: 5/5 Hip abductors: 5/5  Gait unremarkable.   foot exam shows the patient does have some breakdown of the longitudinal arch.Over pronation noted as well. splaying between the first and second toes    Impression and Recommendations:     This case required medical decision making of moderate complexity.      Note: This dictation was prepared with Dragon dictation along with smaller phrase technology. Any transcriptional errors that result from this process are unintentional.

## 2016-08-25 ENCOUNTER — Ambulatory Visit (INDEPENDENT_AMBULATORY_CARE_PROVIDER_SITE_OTHER): Payer: Medicare Other | Admitting: Family Medicine

## 2016-08-25 ENCOUNTER — Encounter: Payer: Self-pay | Admitting: Family Medicine

## 2016-08-25 DIAGNOSIS — F1011 Alcohol abuse, in remission: Secondary | ICD-10-CM | POA: Diagnosis not present

## 2016-08-25 DIAGNOSIS — M216X9 Other acquired deformities of unspecified foot: Secondary | ICD-10-CM

## 2016-08-25 DIAGNOSIS — G621 Alcoholic polyneuropathy: Secondary | ICD-10-CM

## 2016-08-25 NOTE — Assessment & Plan Note (Signed)
Patient's comorbidities I do feel that this is more still from a peripheral neuropathy. Discussed with patient on over-the-counter medications. Patient will try this on a more regular basis. We discussed the possibility of gabapentin which patient declined. Patient seemed to be somewhat inebriated

## 2016-08-25 NOTE — Assessment & Plan Note (Signed)
Inubriated.  Today discussed but patient declined help

## 2016-08-25 NOTE — Patient Instructions (Signed)
Good to see you  Overall you are doing great  Continue with the good shoes Now lets make sure vitamins are covered Look at [employer], [employer] or [employer]  B12 106mcg daily  B6 200mg  daily  Vitamin D 2000 IU dialy  Tart cherry extract any dose at night  See me again in 8-12 weeks.

## 2016-08-25 NOTE — Assessment & Plan Note (Signed)
Discussed and encouraged exercises again in greater detail

## 2016-08-26 DIAGNOSIS — K7031 Alcoholic cirrhosis of liver with ascites: Secondary | ICD-10-CM | POA: Diagnosis not present

## 2016-08-26 DIAGNOSIS — K429 Umbilical hernia without obstruction or gangrene: Secondary | ICD-10-CM | POA: Diagnosis not present

## 2016-08-28 DIAGNOSIS — Z1212 Encounter for screening for malignant neoplasm of rectum: Secondary | ICD-10-CM | POA: Diagnosis not present

## 2016-08-28 DIAGNOSIS — Z1211 Encounter for screening for malignant neoplasm of colon: Secondary | ICD-10-CM | POA: Diagnosis not present

## 2016-09-19 DIAGNOSIS — K3189 Other diseases of stomach and duodenum: Secondary | ICD-10-CM | POA: Diagnosis not present

## 2016-09-19 DIAGNOSIS — I8500 Esophageal varices without bleeding: Secondary | ICD-10-CM | POA: Diagnosis not present

## 2016-09-19 DIAGNOSIS — K766 Portal hypertension: Secondary | ICD-10-CM | POA: Diagnosis not present

## 2016-11-03 ENCOUNTER — Ambulatory Visit: Payer: Medicare Other | Admitting: Family Medicine

## 2016-11-03 ENCOUNTER — Other Ambulatory Visit: Payer: Medicare Other | Admitting: *Deleted

## 2016-11-03 DIAGNOSIS — E7800 Pure hypercholesterolemia, unspecified: Secondary | ICD-10-CM

## 2016-11-03 DIAGNOSIS — E876 Hypokalemia: Secondary | ICD-10-CM | POA: Diagnosis not present

## 2016-11-03 DIAGNOSIS — T50905A Adverse effect of unspecified drugs, medicaments and biological substances, initial encounter: Secondary | ICD-10-CM

## 2016-11-03 NOTE — Progress Notes (Signed)
[id number]  

## 2016-11-04 LAB — BASIC METABOLIC PANEL
BUN/Creatinine Ratio: 14 (ref 12–28)
BUN: 15 mg/dL (ref 8–27)
CO2: 24 mmol/L (ref 20–29)
Calcium: 10 mg/dL (ref 8.7–10.3)
Chloride: 99 mmol/L (ref 96–106)
Creatinine, Ser: 1.1 mg/dL — ABNORMAL HIGH (ref 0.57–1.00)
GFR calc Af Amer: 59 mL/min/{1.73_m2} — ABNORMAL LOW (ref >59)
GFR calc non Af Amer: 51 mL/min/{1.73_m2} — ABNORMAL LOW (ref >59)
Glucose: 119 mg/dL — ABNORMAL HIGH (ref 65–99)
Potassium: 3.8 mmol/L (ref 3.5–5.2)
Sodium: 141 mmol/L (ref 134–144)

## 2016-11-06 ENCOUNTER — Other Ambulatory Visit: Payer: Medicare Other

## 2016-11-11 ENCOUNTER — Ambulatory Visit (INDEPENDENT_AMBULATORY_CARE_PROVIDER_SITE_OTHER)
Admission: RE | Admit: 2016-11-11 | Discharge: 2016-11-11 | Disposition: A | Discharge status: Home / Self Care | Payer: Medicare Other | Source: Ambulatory Visit | Attending: Cardiology | Admitting: Cardiology

## 2016-11-11 DIAGNOSIS — R911 Solitary pulmonary nodule: Secondary | ICD-10-CM | POA: Diagnosis not present

## 2016-11-11 MED ORDER — IOPAMIDOL (ISOVUE-300) INJECTION 61%
80.0000 mL | Freq: Once | INTRAVENOUS | Status: AC | PRN
  Administered 2016-11-11: 80 mL via INTRAVENOUS

## 2016-11-11 NOTE — Progress Notes (Signed)
Reviewed results with patient who was appreciative of the information and the f/u phone call.

## 2016-12-12 DIAGNOSIS — Z23 Encounter for immunization: Secondary | ICD-10-CM | POA: Diagnosis not present

## 2017-01-26 ENCOUNTER — Encounter: Payer: Self-pay | Admitting: Family Medicine

## 2017-01-26 ENCOUNTER — Ambulatory Visit (INDEPENDENT_AMBULATORY_CARE_PROVIDER_SITE_OTHER): Payer: Medicare Other | Admitting: Family Medicine

## 2017-01-26 DIAGNOSIS — M5441 Lumbago with sciatica, right side: Secondary | ICD-10-CM

## 2017-01-26 DIAGNOSIS — M5442 Lumbago with sciatica, left side: Secondary | ICD-10-CM

## 2017-01-26 DIAGNOSIS — M545 Low back pain, unspecified: Secondary | ICD-10-CM | POA: Insufficient documentation

## 2017-01-26 DIAGNOSIS — G8929 Other chronic pain: Secondary | ICD-10-CM | POA: Diagnosis not present

## 2017-01-26 NOTE — Assessment & Plan Note (Signed)
Patient is complaining of low back pain. Patient does have peripheral neuropathy secondary to her alcohol abuse. With patient having as I do not feel high velocity low amplitude osteopathic manipulation would be beneficial for this individual and could be harmful. Like to send to someone who does more muscle energy as well as facilitated positional release. Discussed with patient at great length about this. Patient was fairly frustrated by this but was understanding. Discussed with her that potentially over-the-counter medication can be beneficial. We did bring up the idea of the gabapentin to help with the radicular symptoms which patient declined. Topical anti-inflammatories given. Discussed proper shoes again. Follow-up as needed

## 2017-01-26 NOTE — Patient Instructions (Signed)
Good to see you  I am sorry  I thik Dr. Paulla Fore approach would be better for you specifically.  They will call you or you can call [phone] Stay active and continue the vitamins I will give Dr. Paulla Fore a heads up  See em again in if you need me or write me with questions.

## 2017-01-26 NOTE — Progress Notes (Signed)
[name] [place], [place] [address] Phone: [phone] Subjective:     CC: Low back pain follow-up  [id number]  LEO WEYANDT is a 71 y.o. female coming in for follow up for back pain. Patient states that her foot fracture from 2016 has set off her back pain. She feels that her back is "out" right now. She states that she does go to the gym but that she needs to do more cardio and isn't sure what to do with her pain. Patient states his long as she puts the topical anti-inflammatories on her feet and peripheral neuropathy seems to be better. Patient has been fairly noncompliant on the home exercises. Patient feels that if someone would just do manipulation on her back she would feel better. States that she had someone back in [place] where she felt like could do it appropriately. She does not want to go back there.      Past Medical History:  Diagnosis Date  . Alcoholic cirrhosis ([place])   . Ascites   . Cirrhosis ([place])   . Endometriosis   . GERD (gastroesophageal reflux disease)   . Liver cyst    Past Surgical History:  Procedure Laterality Date  . CAROTID ENDARTERECTOMY Right 2008  . egd with banding  2016   with bleeding [facility]  . ESOPHAGOGASTRODUODENOSCOPY (EGD) WITH PROPOFOL N/A 05/02/2015   Procedure: ESOPHAGOGASTRODUODENOSCOPY (EGD) WITH PROPOFOL;  Surgeon: Arta Silence, MD;  [facility];  Service: Endoscopy;  Laterality: N/A;  . ESOPHAGOGASTRODUODENOSCOPY (EGD) WITH PROPOFOL N/A 06/06/2015   Procedure: ESOPHAGOGASTRODUODENOSCOPY (EGD) WITH PROPOFOL;  Surgeon: Arta Silence, MD;  [facility];  Service: Endoscopy;  Laterality: N/A;  . GASTRIC VARICES BANDING N/A 05/02/2015   Procedure: GASTRIC VARICES BANDING;  Surgeon: Arta Silence, MD;  [facility];  Service: Endoscopy;  Laterality: N/A;  . HEMORROIDECTOMY    . LAMINECTOMY  1989   lumbar 4-5  . lapaendectomy  yrs  ago  . LAPAROSCOPY  1989   adhesions  . oophrectomy Left 1989  . TONSILLECTOMY     age 65   Social History   Social History  . Marital status: Single    Spouse name: N/A  . Number of children: N/A  . Years of education: N/A   Social History Main Topics  . Smoking status: Former Smoker    Start date: 04/17/1968    Quit date: 04/17/1973  . Smokeless tobacco: Never Used  . Alcohol use No     Comment: currently states she is not drinking but states she usually had 2 glasses of wine per evening  . Drug use: No  . Sexual activity: Not Asked   Other Topics Concern  . None   Social History Narrative  . None   Allergies  Allergen Reactions  . Carvedilol Swelling    Other reaction(s): Hypertension (intolerance), Rapid Heart Rate   Family History  Problem Relation Age of Onset  . Diabetes Mother   . Cancer Mother   . Hypertension Mother   . Hyperlipidemia Mother   . Lupus Mother   . Vitiligo Mother   . Heart failure Father   . Heart failure Maternal Grandmother      Past medical history, social, surgical and family history all reviewed in electronic medical record.  No pertanent information unless stated regarding to the chief complaint.   Review of Systems:Review of systems updated and as accurate as of 01/26/17  No  visual changes, nausea, vomiting, diarrhea, constipation, dizziness, skin rash, fevers, chills, night sweats, weight loss, swollen lymph nodes, body aches, joint swelling,  chest pain, shortness of breath, mood changes. Positive muscle aches, headaches, abdominal pain,  Objective  Blood pressure 122/78, pulse 65, weight 156 lb (70.8 kg), SpO2 98 %. Systems examined below as of 01/26/17   General: No apparent distress alert and oriented x3 mood and affect normal, dressed appropriately.  HEENT: Pupils equal, extraocular movements intact  Respiratory: Patient's speak in full sentences and does not appear short of breath  Cardiovascular: No lower extremity  edema, non tender, no erythema  Skin: Warm dry intact with no signs of infection or rash on extremities or on axial skeleton.  Abdomen: Soft nontender  Neuro: Cranial nerves II through XII are intact, neurovascularly intact in all extremities with 2+ DTRs and 2+ pulses.  Lymph: No lymphadenopathy of posterior or anterior cervical chain or axillae bilaterally.  Gait Mild antalgic MSK:  Non tender with full range of motion and good stability and symmetric strength and tone of shoulders, elbows, wrist, hip, knee and ankles bilaterally. Mild to moderate arthritic changes of the joints  Back Exam:  Inspection: Loss of lordosis with degenerative scoliosis Motion: Flexion 45 deg, Extension 25 deg, Side Bending to 35 deg bilaterally,  Rotation to 35 deg bilaterally  SLR laying: Negative  XSLR laying: Negative  Palpable tenderness: Diffuse tenderness to palpation that is out of proportion to the amount of palpation.Marland Kitchen FABER: Tightness bilaterally. Sensory change: Gross sensation intact to all lumbar and sacral dermatomes.  Reflexes: 2+ at both patellar tendons, 2+ at achilles tendons, Babinski's downgoing.  Strength at foot  4 out of 5 but symmetric.    Impression and Recommendations:     This case required medical decision making of moderate complexity.      Note: This dictation was prepared with Dragon dictation along with smaller phrase technology. Any transcriptional errors that result from this process are unintentional.

## 2017-02-04 ENCOUNTER — Ambulatory Visit: Payer: Medicare Other | Admitting: Sports Medicine

## 2017-02-09 ENCOUNTER — Telehealth: Payer: Self-pay | Admitting: Sports Medicine

## 2017-02-09 NOTE — Telephone Encounter (Signed)
Spoke to pt regarding Dr. Nicolasa Ducking inability to perform manipulations at the moment.  Will call her back to r/s once he is feeling better and able to do manipulations. -MW

## 2017-02-09 NOTE — Telephone Encounter (Signed)
Patient returning missed call.  -LL

## 2017-02-09 NOTE — Telephone Encounter (Signed)
Patient returning call to [facility] or [place]

## 2017-02-10 ENCOUNTER — Ambulatory Visit: Payer: Medicare Other | Admitting: Sports Medicine

## 2017-02-20 ENCOUNTER — Other Ambulatory Visit: Payer: Self-pay | Admitting: Gastroenterology

## 2017-02-20 DIAGNOSIS — K7460 Unspecified cirrhosis of liver: Secondary | ICD-10-CM

## 2017-02-20 DIAGNOSIS — I8500 Esophageal varices without bleeding: Secondary | ICD-10-CM | POA: Diagnosis not present

## 2017-02-20 DIAGNOSIS — G479 Sleep disorder, unspecified: Secondary | ICD-10-CM | POA: Diagnosis not present

## 2017-02-20 DIAGNOSIS — K7290 Hepatic failure, unspecified without coma: Secondary | ICD-10-CM | POA: Diagnosis not present

## 2017-03-03 ENCOUNTER — Other Ambulatory Visit: Payer: Medicare Other

## 2017-03-10 ENCOUNTER — Ambulatory Visit
Admission: RE | Admit: 2017-03-10 | Discharge: 2017-03-10 | Disposition: A | Discharge status: Home / Self Care | Payer: Medicare Other | Source: Ambulatory Visit | Attending: Gastroenterology | Admitting: Gastroenterology

## 2017-03-10 ENCOUNTER — Other Ambulatory Visit: Payer: Self-pay | Admitting: Family Medicine

## 2017-03-10 DIAGNOSIS — K8020 Calculus of gallbladder without cholecystitis without obstruction: Secondary | ICD-10-CM | POA: Diagnosis not present

## 2017-03-10 DIAGNOSIS — L820 Inflamed seborrheic keratosis: Secondary | ICD-10-CM | POA: Diagnosis not present

## 2017-03-10 DIAGNOSIS — K7460 Unspecified cirrhosis of liver: Secondary | ICD-10-CM

## 2017-04-17 DIAGNOSIS — K8020 Calculus of gallbladder without cholecystitis without obstruction: Secondary | ICD-10-CM | POA: Diagnosis not present

## 2017-04-17 DIAGNOSIS — K7030 Alcoholic cirrhosis of liver without ascites: Secondary | ICD-10-CM | POA: Diagnosis not present

## 2017-06-01 ENCOUNTER — Other Ambulatory Visit: Payer: Self-pay | Admitting: Family Medicine

## 2017-06-01 DIAGNOSIS — Z0000 Encounter for general adult medical examination without abnormal findings: Secondary | ICD-10-CM | POA: Diagnosis not present

## 2017-06-01 DIAGNOSIS — Z1231 Encounter for screening mammogram for malignant neoplasm of breast: Secondary | ICD-10-CM | POA: Diagnosis not present

## 2017-06-01 DIAGNOSIS — Z13220 Encounter for screening for lipoid disorders: Secondary | ICD-10-CM | POA: Diagnosis not present

## 2017-06-01 DIAGNOSIS — Z136 Encounter for screening for cardiovascular disorders: Secondary | ICD-10-CM | POA: Diagnosis not present

## 2017-06-01 DIAGNOSIS — N289 Disorder of kidney and ureter, unspecified: Secondary | ICD-10-CM | POA: Diagnosis not present

## 2017-06-18 ENCOUNTER — Ambulatory Visit
Admission: RE | Admit: 2017-06-18 | Discharge: 2017-06-18 | Disposition: A | Discharge status: Home / Self Care | Payer: Medicare Other | Source: Ambulatory Visit | Attending: Family Medicine | Admitting: Family Medicine

## 2017-06-18 DIAGNOSIS — Z1231 Encounter for screening mammogram for malignant neoplasm of breast: Secondary | ICD-10-CM | POA: Diagnosis not present

## 2017-06-19 ENCOUNTER — Other Ambulatory Visit: Payer: Self-pay | Admitting: Family Medicine

## 2017-06-19 DIAGNOSIS — R928 Other abnormal and inconclusive findings on diagnostic imaging of breast: Secondary | ICD-10-CM

## 2017-06-24 ENCOUNTER — Other Ambulatory Visit: Payer: Medicare Other

## 2017-06-30 ENCOUNTER — Ambulatory Visit
Admission: RE | Admit: 2017-06-30 | Discharge: 2017-06-30 | Disposition: A | Discharge status: Home / Self Care | Payer: Medicare Other | Source: Ambulatory Visit | Attending: Family Medicine | Admitting: Family Medicine

## 2017-06-30 DIAGNOSIS — H35033 Hypertensive retinopathy, bilateral: Secondary | ICD-10-CM | POA: Diagnosis not present

## 2017-06-30 DIAGNOSIS — N6011 Diffuse cystic mastopathy of right breast: Secondary | ICD-10-CM | POA: Diagnosis not present

## 2017-06-30 DIAGNOSIS — R928 Other abnormal and inconclusive findings on diagnostic imaging of breast: Secondary | ICD-10-CM

## 2017-07-14 DIAGNOSIS — R05 Cough: Secondary | ICD-10-CM | POA: Diagnosis not present

## 2017-07-14 DIAGNOSIS — J209 Acute bronchitis, unspecified: Secondary | ICD-10-CM | POA: Diagnosis not present

## 2017-07-22 ENCOUNTER — Other Ambulatory Visit: Payer: Self-pay | Admitting: Family Medicine

## 2017-07-22 DIAGNOSIS — L820 Inflamed seborrheic keratosis: Secondary | ICD-10-CM | POA: Diagnosis not present

## 2017-07-22 DIAGNOSIS — J209 Acute bronchitis, unspecified: Secondary | ICD-10-CM | POA: Diagnosis not present

## 2017-08-04 DIAGNOSIS — I8511 Secondary esophageal varices with bleeding: Secondary | ICD-10-CM | POA: Diagnosis not present

## 2017-08-04 DIAGNOSIS — K7031 Alcoholic cirrhosis of liver with ascites: Secondary | ICD-10-CM | POA: Diagnosis not present

## 2017-08-07 ENCOUNTER — Other Ambulatory Visit: Payer: Self-pay | Admitting: Gastroenterology

## 2017-08-07 DIAGNOSIS — K7290 Hepatic failure, unspecified without coma: Secondary | ICD-10-CM | POA: Diagnosis not present

## 2017-08-07 DIAGNOSIS — K7030 Alcoholic cirrhosis of liver without ascites: Secondary | ICD-10-CM | POA: Diagnosis not present

## 2017-08-07 DIAGNOSIS — I8500 Esophageal varices without bleeding: Secondary | ICD-10-CM | POA: Diagnosis not present

## 2017-08-07 DIAGNOSIS — R188 Other ascites: Secondary | ICD-10-CM | POA: Diagnosis not present

## 2017-08-18 ENCOUNTER — Ambulatory Visit
Admission: RE | Admit: 2017-08-18 | Discharge: 2017-08-18 | Disposition: A | Discharge status: Home / Self Care | Payer: Medicare Other | Source: Ambulatory Visit | Attending: Gastroenterology | Admitting: Gastroenterology

## 2017-08-18 DIAGNOSIS — K7030 Alcoholic cirrhosis of liver without ascites: Secondary | ICD-10-CM

## 2017-08-18 DIAGNOSIS — K8020 Calculus of gallbladder without cholecystitis without obstruction: Secondary | ICD-10-CM | POA: Diagnosis not present

## 2017-09-03 DIAGNOSIS — R05 Cough: Secondary | ICD-10-CM | POA: Diagnosis not present

## 2017-10-17 IMAGING — US US ABDOMEN LIMITED
1 series · 4 of 4 positions shown · non-contrast
Comparison: None.

CLINICAL DATA: Abdominal distention.  Hepatic cirrhosis.

EXAM:
LIMITED ABDOMEN ULTRASOUND FOR ASCITES
TECHNIQUE: Limited ultrasound survey for ascites was performed in all four
abdominal quadrants.

[Series 1: us abdomen limited · 0.26mm/px · 4 of 4 slices shown]
[im 1/4]
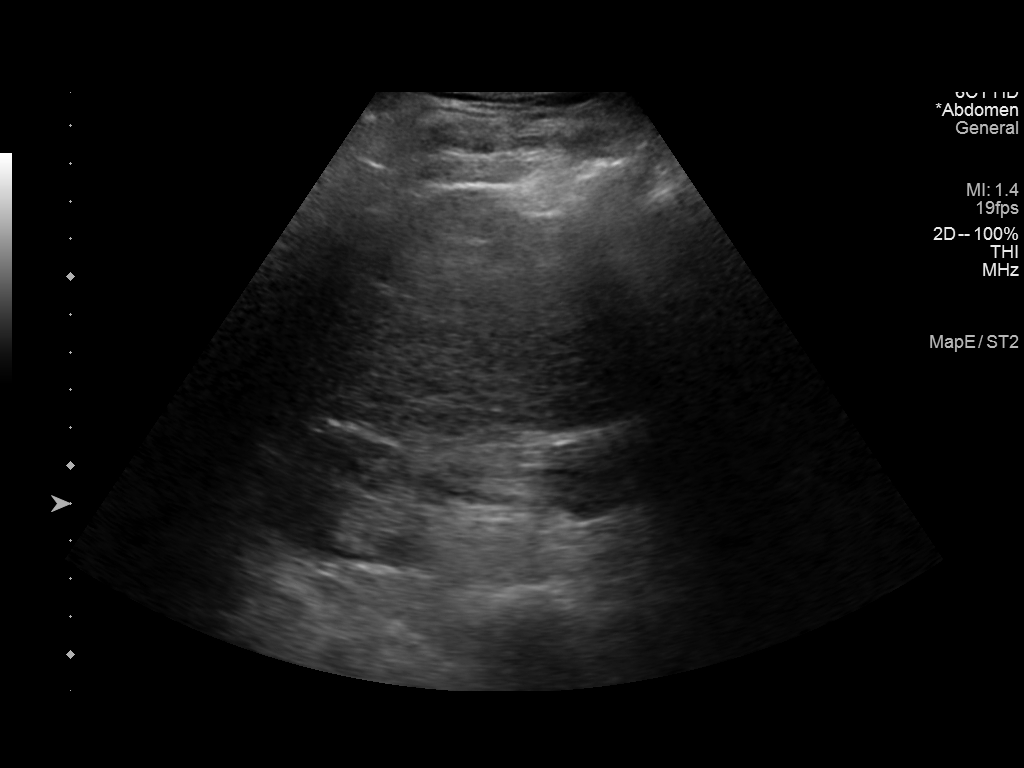
[im 2/4]
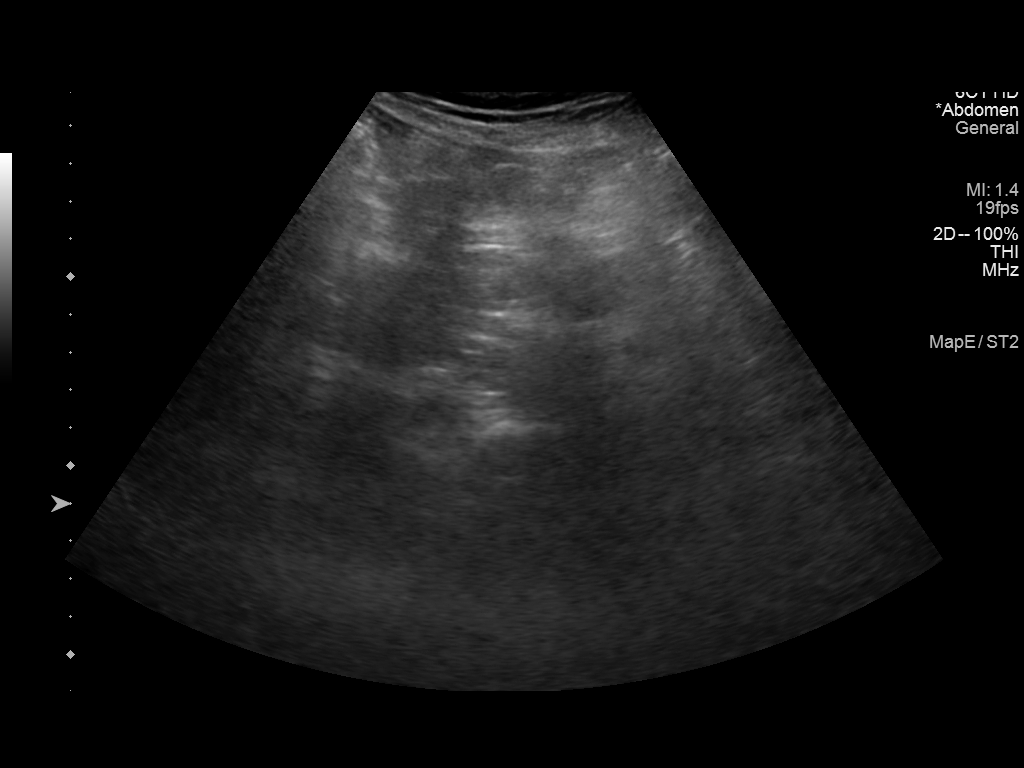
[im 3/4]
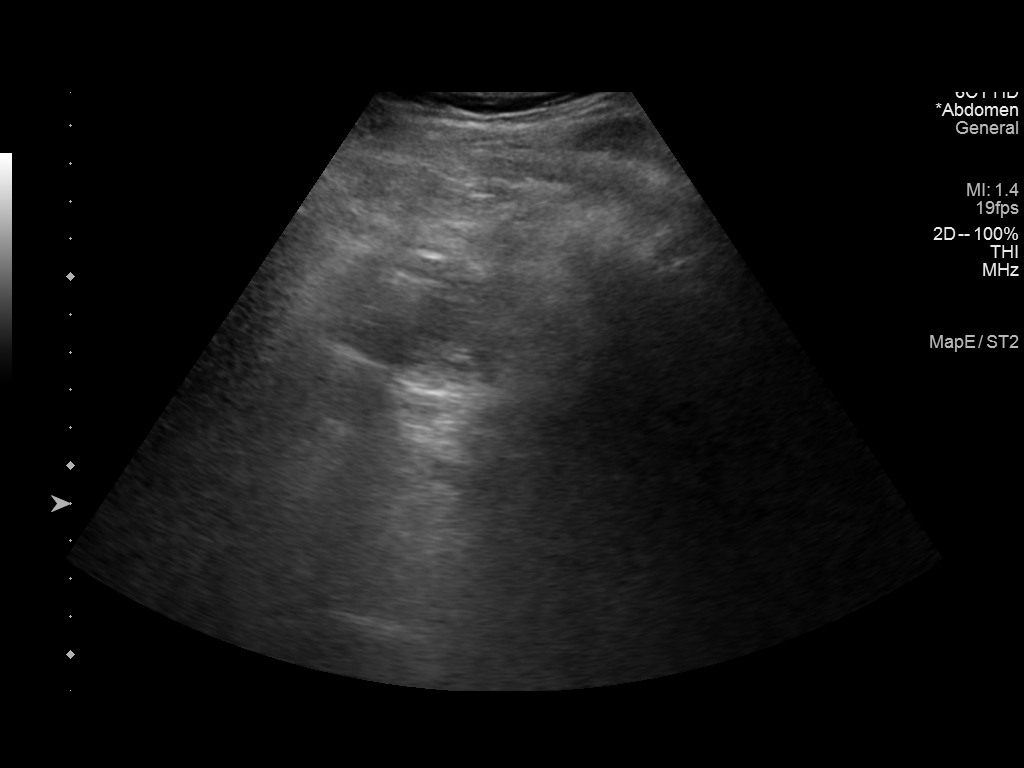
[im 4/4]
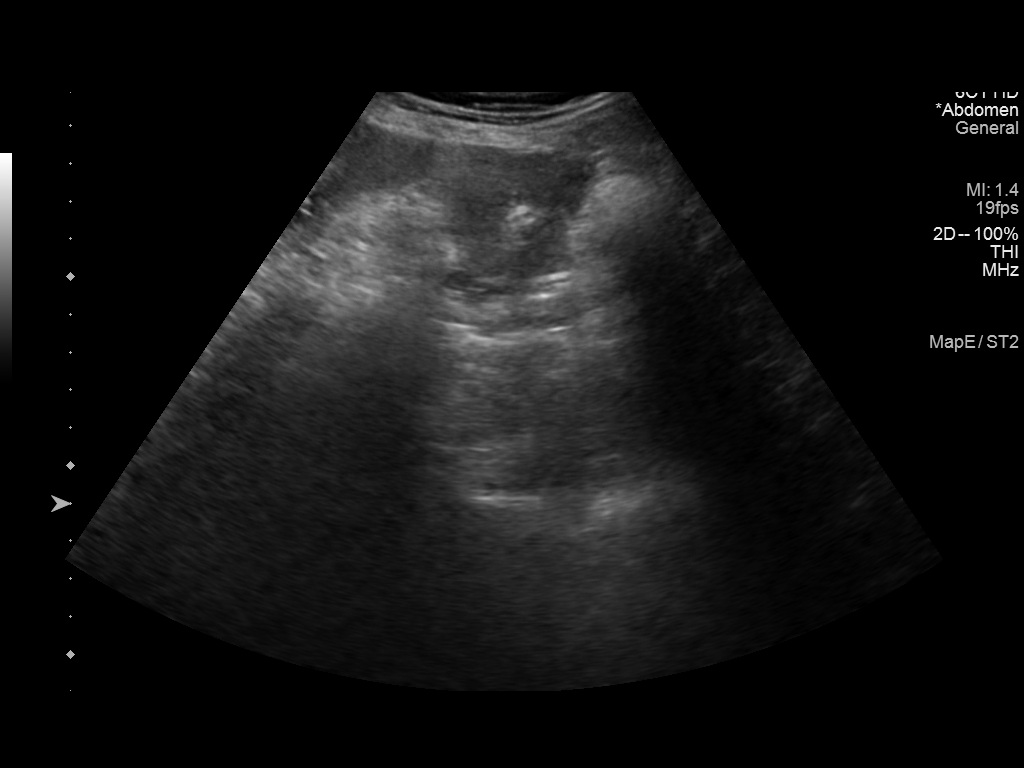

[4 of 4 positions shown; findings below may reference images not displayed]

FINDINGS: There is no demonstrable ascites. Peristalsing bowel is seen
throughout the abdomen.
IMPRESSION: No demonstrable ascites.

## 2017-11-29 DIAGNOSIS — Z23 Encounter for immunization: Secondary | ICD-10-CM | POA: Diagnosis not present

## 2018-01-19 ENCOUNTER — Telehealth: Payer: Self-pay | Admitting: Cardiology

## 2018-01-19 NOTE — Telephone Encounter (Signed)
New Message         Patient is calling to see what her appointment is for? (Scheduled a recall) 04/19/2018

## 2018-01-19 NOTE — Telephone Encounter (Signed)
LMTCB to advise pt that Dr. Meda Coffee had noted on her 11/2016 Chest CT that there was no need for further follow up so pt to keep her ROV with Dr. Meda Coffee 04/2018.

## 2018-01-19 NOTE — Telephone Encounter (Signed)
Pt agrees to keep appt 04/19/18 ... Will come fasting for labwork

## 2018-01-24 IMAGING — US US ABDOMEN COMPLETE
1 series · 13 of 25 positions shown · non-contrast
Comparison: Abdominal ultrasound October 25, 2015

CLINICAL DATA: Hepatic cirrhosis, hepatoma screening ; known
varices, gallstones

EXAM:
ABDOMEN ULTRASOUND COMPLETE

[Series 1: us abdomen complete · 0.14mm/px · 13 of 101 slices shown]
[im 1/101]
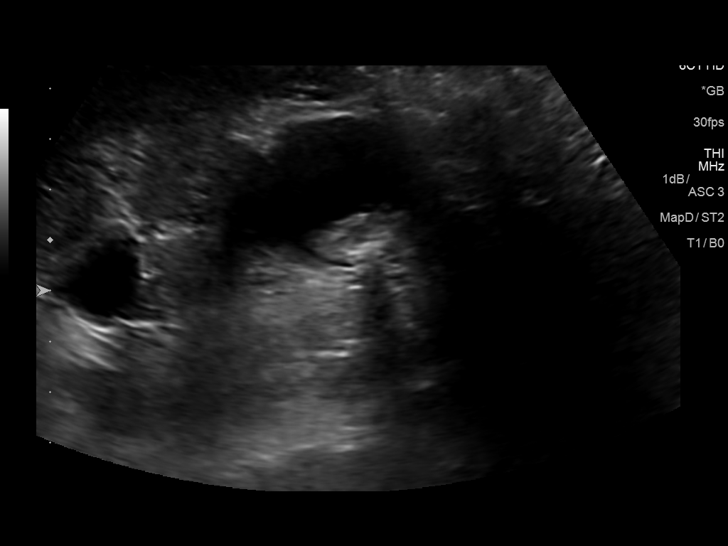
[im 9/101]
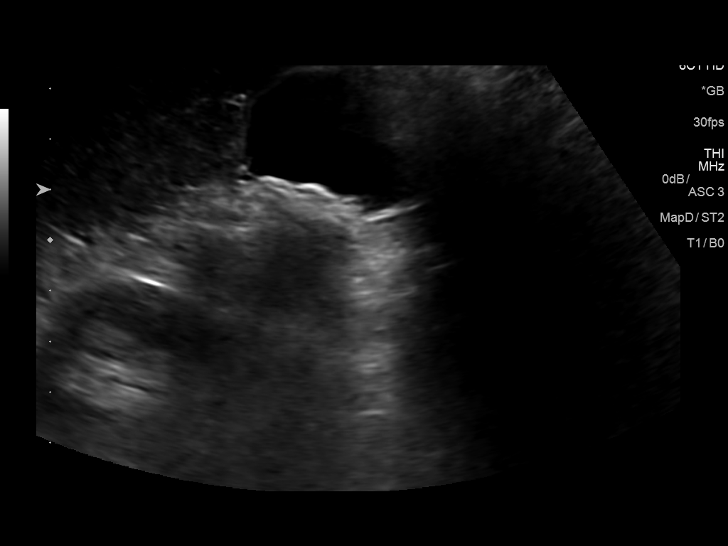
[im 17/101]
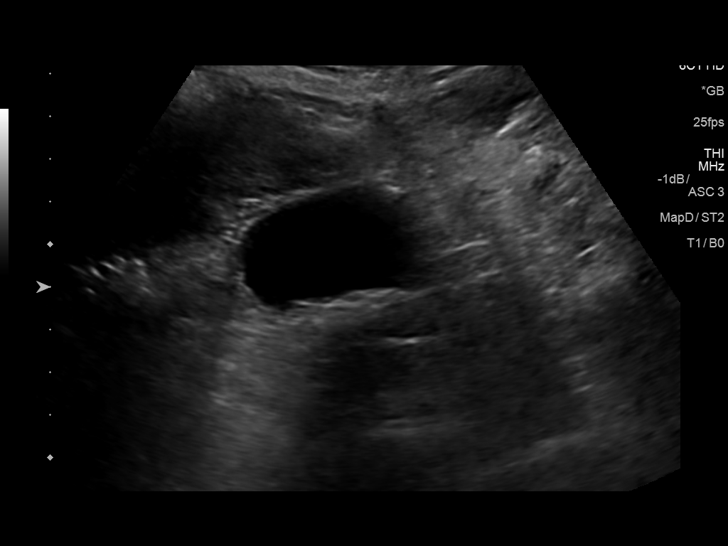
[im 26/101]
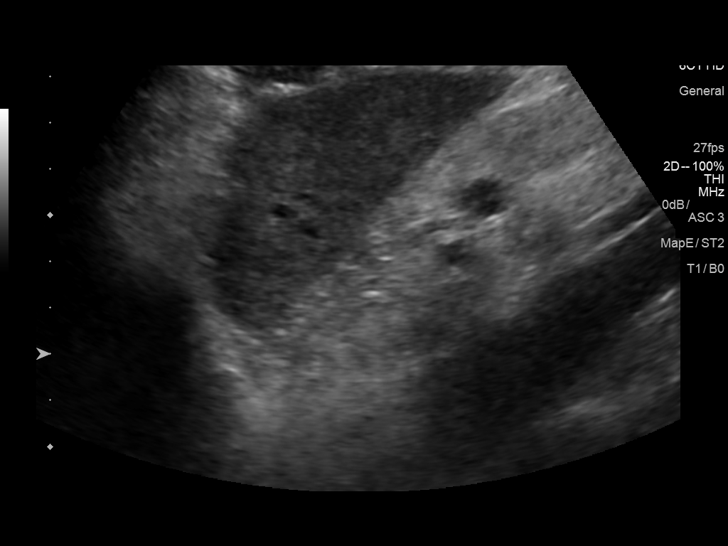
[im 34/101]
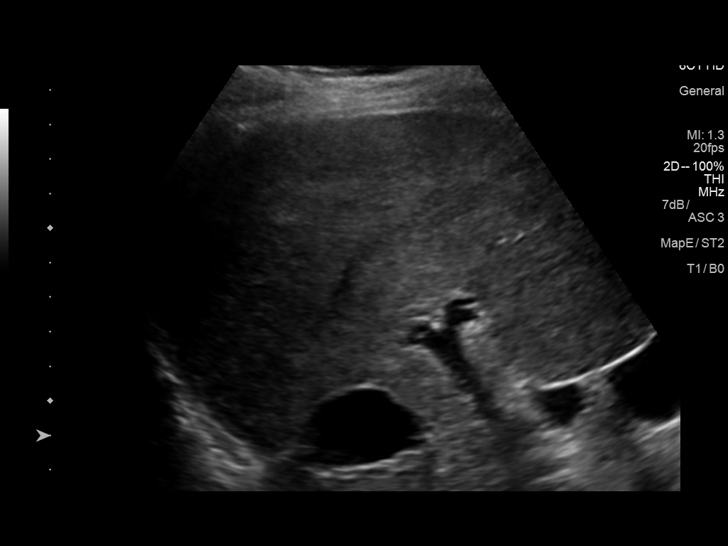
[im 42/101]
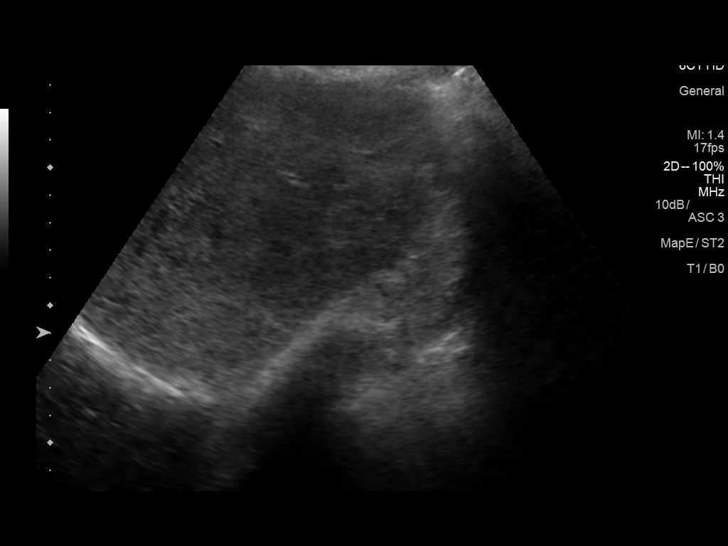
[im 51/101]
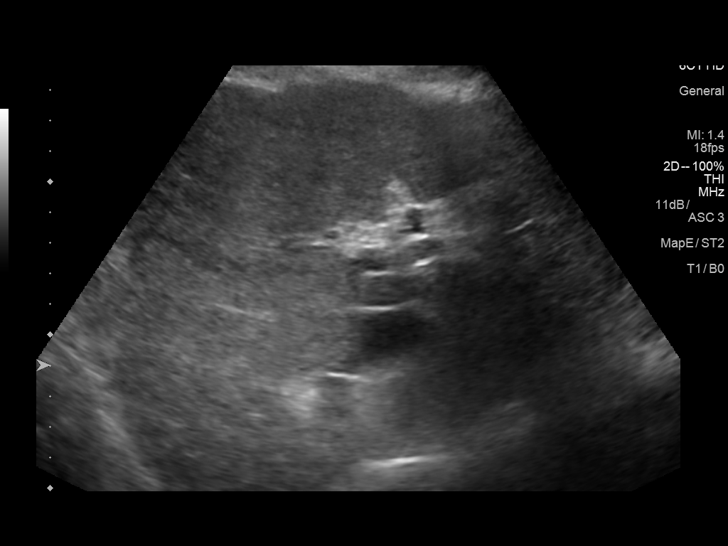
[im 59/101]
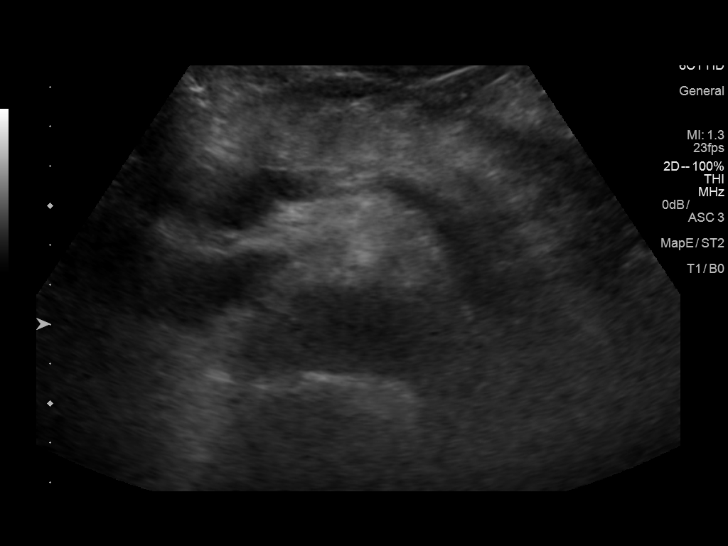
[im 67/101]
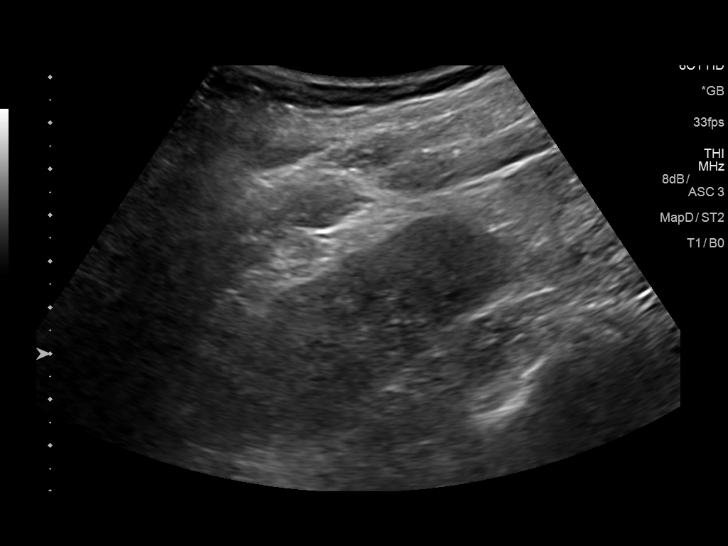
[im 76/101]
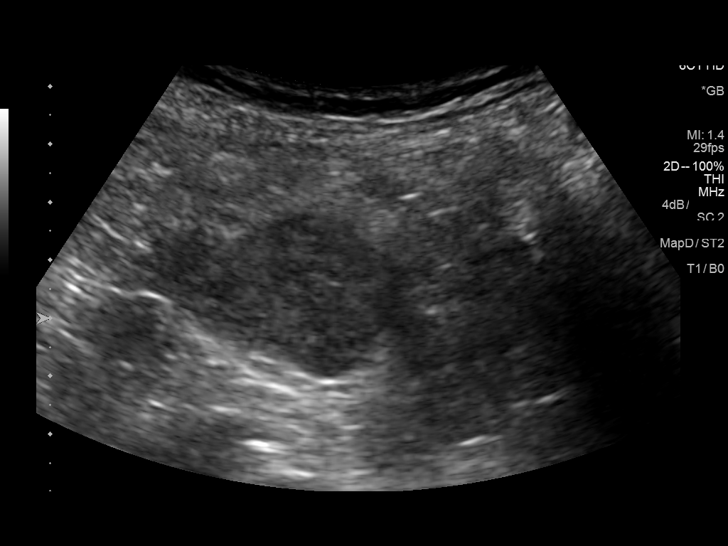
[im 84/101]
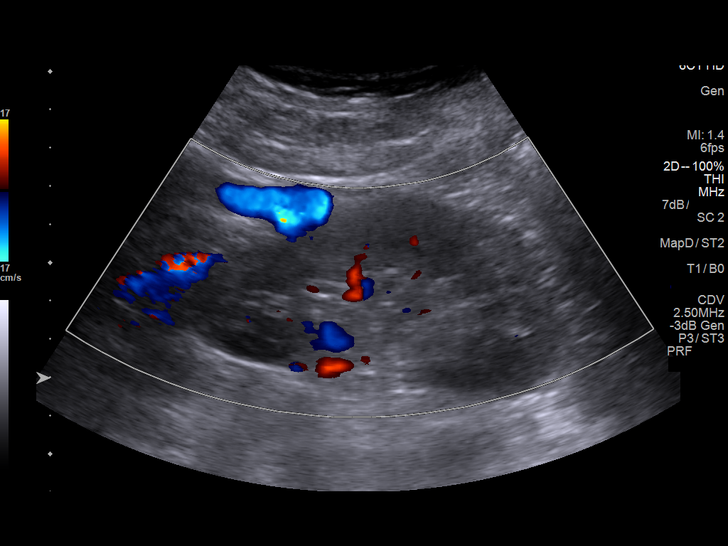
[im 92/101]
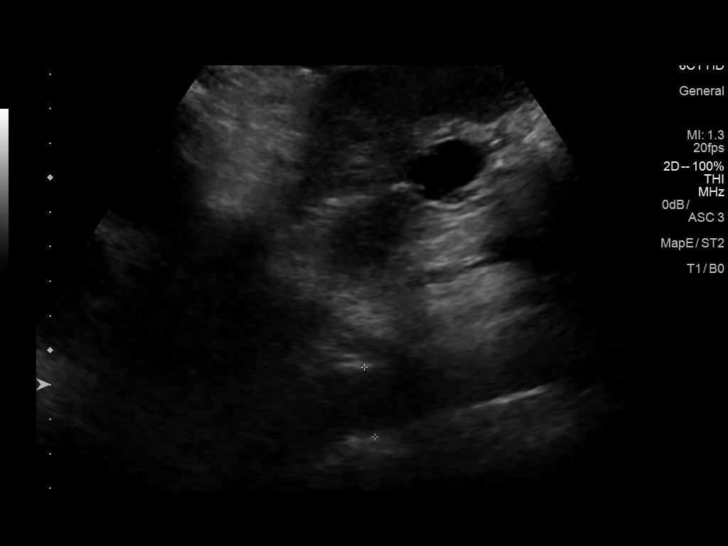
[im 101/101]
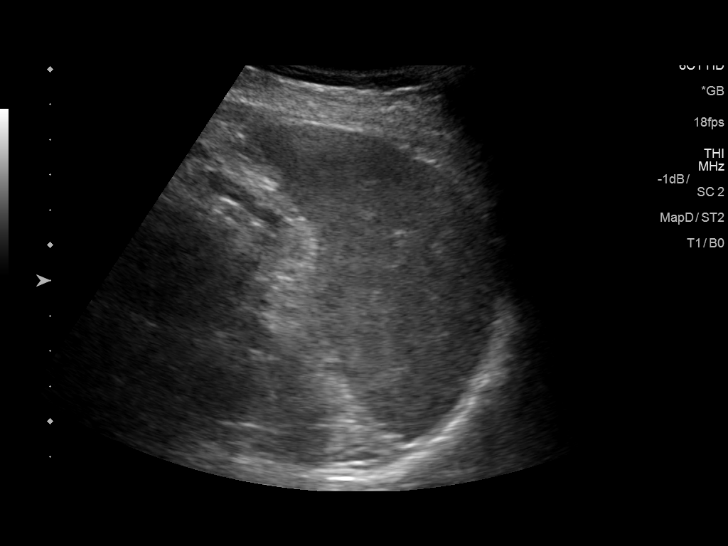

[13 of 25 positions shown; findings below may reference images not displayed]

FINDINGS: Gallbladder: The gallbladder is adequately distended. Echogenic
mobile stones are observed with the largest measuring 1.8 cm in
diameter. There is no gallbladder wall thickening, pericholecystic
fluid, or positive sonographic Murphy's sign.

Common bile duct: Diameter: 6.3 mm

Liver: The hepatic echotexture is heterogeneous. The surface contour
of the liver is mildly irregular. A left lobe cyst is present
measuring 1.7 x 1.5 x 1.8 cm. No solid hepatic mass is observed.

IVC: No abnormality visualized.

Pancreas: Visualized portion unremarkable.

Spleen: There is no splenomegaly.

Right Kidney: Length: 9.7 cm. Echogenicity within normal limits. No
mass or hydronephrosis visualized.

Left Kidney: Length: 10.6 cm. Echogenicity within normal limits. No
mass or hydronephrosis visualized.

Abdominal aorta: No aneurysm visualized.

Other findings: No ascites.
IMPRESSION: Stable cirrhotic changes within the liver. No abnormal mass is
observed. There is a simple appearing left lobe cyst measuring
cm in greatest dimension. No splenomegaly or ascites.

Gallstones without sonographic evidence of acute cholecystitis.

## 2018-02-19 ENCOUNTER — Other Ambulatory Visit: Payer: Self-pay | Admitting: Gastroenterology

## 2018-02-19 DIAGNOSIS — K7469 Other cirrhosis of liver: Secondary | ICD-10-CM

## 2018-02-26 ENCOUNTER — Ambulatory Visit
Admission: RE | Admit: 2018-02-26 | Discharge: 2018-02-26 | Disposition: A | Discharge status: Home / Self Care | Payer: Medicare Other | Source: Ambulatory Visit | Attending: Gastroenterology | Admitting: Gastroenterology

## 2018-02-26 DIAGNOSIS — K8020 Calculus of gallbladder without cholecystitis without obstruction: Secondary | ICD-10-CM | POA: Diagnosis not present

## 2018-02-26 DIAGNOSIS — K7469 Other cirrhosis of liver: Secondary | ICD-10-CM

## 2018-04-07 HISTORY — PX: UMBILICAL HERNIA REPAIR: SHX196

## 2018-04-13 DIAGNOSIS — K7460 Unspecified cirrhosis of liver: Secondary | ICD-10-CM | POA: Diagnosis not present

## 2018-04-13 DIAGNOSIS — L299 Pruritus, unspecified: Secondary | ICD-10-CM | POA: Diagnosis not present

## 2018-04-19 ENCOUNTER — Ambulatory Visit (INDEPENDENT_AMBULATORY_CARE_PROVIDER_SITE_OTHER): Payer: Medicare Other | Admitting: Cardiology

## 2018-04-19 ENCOUNTER — Encounter: Payer: Self-pay | Admitting: Cardiology

## 2018-04-19 ENCOUNTER — Telehealth: Payer: Self-pay | Admitting: *Deleted

## 2018-04-19 VITALS — BP 118/62 | HR 51 | Ht 66.0 in | Wt 161.2 lb

## 2018-04-19 DIAGNOSIS — I6529 Occlusion and stenosis of unspecified carotid artery: Secondary | ICD-10-CM

## 2018-04-19 DIAGNOSIS — R002 Palpitations: Secondary | ICD-10-CM | POA: Diagnosis not present

## 2018-04-19 DIAGNOSIS — I2583 Coronary atherosclerosis due to lipid rich plaque: Secondary | ICD-10-CM | POA: Diagnosis not present

## 2018-04-19 DIAGNOSIS — E7800 Pure hypercholesterolemia, unspecified: Secondary | ICD-10-CM

## 2018-04-19 DIAGNOSIS — R635 Abnormal weight gain: Secondary | ICD-10-CM | POA: Diagnosis not present

## 2018-04-19 DIAGNOSIS — I2510 Atherosclerotic heart disease of native coronary artery without angina pectoris: Secondary | ICD-10-CM | POA: Diagnosis not present

## 2018-04-19 MED ORDER — NADOLOL 20 MG PO TABS: 10.0000 mg | ORAL_TABLET | Freq: Every day | ORAL | 11 refills | Status: DC

## 2018-04-19 NOTE — Telephone Encounter (Signed)
Message sent to Dr Meda Coffee as an Juluis Rainier.  We were already aware that pt refused to wait her turn to have labs done.

## 2018-04-19 NOTE — Patient Instructions (Signed)
Medication Instructions:   DECREASE YOUR NADOLOL TO 10 MG ONCE DAILY  If you need a refill on your cardiac medications before your next appointment, please call your pharmacy.     Lab work:  TODAY--CMET, CBC W DIFF, TSH, AND LIPIDS If you have labs (blood work) drawn today and your tests are completely normal, you will receive your results only by: Marland Kitchen MyChart Message (if you have MyChart) OR . A paper copy in the mail If you have any lab test that is abnormal or we need to change your treatment, we will call you to review the results.    Testing/Procedures:  Your physician has requested that you have a carotid duplex. This test is an ultrasound of the carotid arteries in your neck. It looks at blood flow through these arteries that supply the brain with blood. Allow one hour for this exam. There are no restrictions or special instructions.     Follow-Up: At [facility], you and your health needs are our priority.  As part of our continuing mission to provide you with exceptional heart care, we have created designated Provider Care Teams.  These Care Teams include your primary Cardiologist (physician) and Advanced Practice Providers (APPs -  Physician Assistants and Nurse Practitioners) who all work together to provide you with the care you need, when you need it. You will need a follow up appointment in 1 years.  Please call our office 2 months in advance to schedule this appointment.  You may see Ena Dawley, MD or one of the following Advanced Practice Providers on your designated Care Team:   [place], PA-C Melina Copa, PA-C . Ermalinda Barrios, PA-C

## 2018-04-19 NOTE — Progress Notes (Signed)
Cardiology Office Note:    Date:  04/19/2018   ID:  ORAL HALLGREN, DOB Feb 28, 1946, MRN [id number]  PCP:  Arta Silence, MD  Cardiologist:  Ena Dawley, MD  Electrophysiologist:  None   Referring MD: Arta Silence, MD   Reason for visit: 3-year follow-up  History of Present Illness:    Nicole Barr is a 73 y.o. female with a hx of alcoholic cirrhosis being evaluated for transplant, esophageal varices, non obst CAD and carotid artery disease s/p R CEA who presents to clinic for evaluation of weight gain and abdominal distension.   She has a h/o alcoholic cirrhosis (dx 2549) with esophageal varices, ascites and hepatic encephalopathy evaluated at [facility] for possible transplant and found not to be a candidate due to a positive stress test. As part of pre -transplant work up she underwent stress echocardiogram with positive ECG portion but negative echo portion. 2D ECHO 08/2014 showed normal EF, normal diastolic function, trivial TR, and trivial pericardial effusion. She saw Dr. Meda Coffee in 09/2014 fo further evaluation.  She underwent a coronary CTA 10/2014 that showed 50-69% stenosis and in the proximal LAD, calcium score of 335. Because of her cirrhosis, she wasn't started on any statins. Per Dr. Francesca Oman last note, she was not a transplant candidate at that time as her MALT score was 9.  She saw Dr. Meda Coffee last in 03/2015. She complained of gaining weight and increased abdominal girth at that time. Weight was 162 lbs. Dr. Meda Coffee recommended that she use Lasix/spironolactone TID (instead of BID) x 1 week. She was also on nadolol 69m BID. Noted that her orthostasis and bradycardia had improved.   Review of phone notes reveal that she walked in the office demanding to be seen emergently for a reported weight gain of 40 lbs and worsening abdominal distension different than her normal ascites. She was sure it was related to her nadolol. Dr. NMeda Coffeerecommended  discontinuing nadolol and increasing lasix to 429mdaily with a BMET in 2-3 weeks and APP f/u.   04/19/2018 -she is coming after 3 years, denies any chest pain or shortness of breath, she exercises 5 times a week, and her biggest complaint is weight gain, she is 5 pounds lighter than 3 years ago when I last saw her.  She states that she used to have 130 pounds but ever since she was diagnosed with alcoholic cirrhosis her weight went up.  She cooks for herself eats very healthy limits her food portions but has hard time losing her weight anyway.  She has no palpitation dizziness or syncope.  Past Medical History:  Diagnosis Date  . Alcoholic cirrhosis ([facility]  . Ascites   . Cirrhosis ([facility]  . Endometriosis   . GERD (gastroesophageal reflux disease)   . Liver cyst     Past Surgical History:  Procedure Laterality Date  . CAROTID ENDARTERECTOMY Right 2008  . egd with banding  2016   with bleeding after wards done at [facility]  . ESOPHAGOGASTRODUODENOSCOPY (EGD) WITH PROPOFOL N/A 05/02/2015   Procedure: ESOPHAGOGASTRODUODENOSCOPY (EGD) WITH PROPOFOL;  Surgeon: WiArta SilenceMD;  Location: [facility] ENDOSCOPY;  Service: Endoscopy;  Laterality: N/A;  . ESOPHAGOGASTRODUODENOSCOPY (EGD) WITH PROPOFOL N/A 06/06/2015   Procedure: ESOPHAGOGASTRODUODENOSCOPY (EGD) WITH PROPOFOL;  Surgeon: WiArta SilenceMD;  Location: [facility] ENDOSCOPY;  Service: Endoscopy;  Laterality: N/A;  . GASTRIC VARICES BANDING N/A 05/02/2015   Procedure: GASTRIC VARICES BANDING;  Surgeon: WiArta SilenceMD;  Location: [facility] ENDOSCOPY;  Service: Endoscopy;  Laterality:  N/A;  . HEMORROIDECTOMY    . LAMINECTOMY  1989   lumbar 4-5  . lapaendectomy  yrs ago  . LAPAROSCOPY  1989   adhesions  . oophrectomy Left 1989  . TONSILLECTOMY     age 61    Current Medications: Current Meds  Medication Sig  . furosemide (LASIX) 20 MG tablet Take 40 mg by mouth daily.  Marland Kitchen lactulose (CHRONULAC) 10 GM/15ML solution 3-4 tablespoons daily by mouth  .  Multiple Vitamins-Minerals (MULTIVITAMIN WITH MINERALS) tablet Take 1 tablet by mouth daily.  . rifaximin (XIFAXAN) 550 MG TABS tablet Take 550 mg by mouth 2 (two) times daily.  Marland Kitchen spironolactone (ALDACTONE) 50 MG tablet Take 50 mg by mouth 2 (two) times daily.  . [DISCONTINUED] ibuprofen (ADVIL,MOTRIN) 200 MG tablet Take 200 mg by mouth every 6 (six) hours as needed (for pain).  . [DISCONTINUED] nadolol (CORGARD) 20 MG tablet Take 20 mg by mouth daily.     Allergies:   Iodine and Carvedilol   Social History   Socioeconomic History  . Marital status: Single    Spouse name: Not on file  . Number of children: Not on file  . Years of education: Not on file  . Highest education level: Not on file  Occupational History  . Not on file  Social Needs  . Financial resource strain: Not on file  . Food insecurity:    Worry: Not on file    Inability: Not on file  . Transportation needs:    Medical: Not on file    Non-medical: Not on file  Tobacco Use  . Smoking status: Former Smoker    Start date: 04/17/1968    Last attempt to quit: 04/17/1973    Years since quitting: 45.0  . Smokeless tobacco: Never Used  Substance and Sexual Activity  . Alcohol use: No    Alcohol/week: 10.0 standard drinks    Types: 10 Glasses of wine per week    Comment: currently states she is not drinking but states she usually had 2 glasses of wine per evening  . Drug use: No  . Sexual activity: Not on file  Lifestyle  . Physical activity:    Days per week: Not on file    Minutes per session: Not on file  . Stress: Not on file  Relationships  . Social connections:    Talks on phone: Not on file    Gets together: Not on file    Attends religious service: Not on file    Active member of club or organization: Not on file    Attends meetings of clubs or organizations: Not on file    Relationship status: Not on file  Other Topics Concern  . Not on file  Social History Narrative  . Not on file     Family  History: The patient's family history includes Cancer in her mother; Diabetes in her mother; Heart failure in her father and maternal grandmother; Hyperlipidemia in her mother; Hypertension in her mother; Lupus in her mother; Vitiligo in her mother.  ROS:   Please see the history of present illness.    All other systems reviewed and are negative.  EKGs/Labs/Other Studies Reviewed:    The following studies were reviewed today:  EKG:  EKG is ordered today.  The ekg ordered today demonstrates sinus bradycardia, otherwise normal EKG.  This was personally reviewed.  Recent Labs: No results found for requested labs within last 8760 hours.  Recent Lipid Panel No results found  for: CHOL, TRIG, HDL, CHOLHDL, VLDL, LDLCALC, LDLDIRECT  Physical Exam:    VS:  BP 118/62   Pulse (!) 51   Ht _0  (1.676 m)   Wt 161 lb 3.2 oz (73.1 kg)   BMI 26.02 kg/m     Wt Readings from Last 3 Encounters:  04/19/18 161 lb 3.2 oz (73.1 kg)  01/26/17 156 lb (70.8 kg)  08/25/16 166 lb (75.3 kg)    GEN: Well nourished, well developed in no acute distress HEENT: Normal NECK: No JVD; No carotid bruits LYMPHATICS: No lymphadenopathy CARDIAC: RRR, no murmurs, rubs, gallops RESPIRATORY:  Clear to auscultation without rales, wheezing or rhonchi  ABDOMEN: Soft, non-tender, non-distended MUSCULOSKELETAL:  No edema; No deformity  SKIN: Warm and dry NEUROLOGIC:  Alert and oriented x 3 PSYCHIATRIC:  Normal affect   Coronary CTA 10/25/2014 IMPRESSION: 1. Coronary calcium score of 335. This was 39 percentile for age and sex matched control. 2. Normal coronary origin. There is right and left co-dominance. 3. There is moderate non-obstructive CAD in the proximal LAD. Diffuse calcified plaque. An aggressive risk factor modification is recommended.  2D ECHO 08/2014 (scanned [facility]) showed normal EF, normal diastolic function, trivial TR, and trivial pericardial effusion.  ASSESSMENT:    1.  Hypercholesterolemia without hypertriglyceridemia   2. Coronary artery disease due to lipid rich plaque   3. Stenosis of carotid artery, unspecified laterality   4. Weight gain   5. Palpitations    PLAN:    In order of problems listed above:  Moderate nonobstructive CAD -Appears to be stable, she is asymptomatic, she cannot take statin because of cirrhosis. -She is encouraged to continue exercise and eat healthy. -We will check lipids today.  If elevated will refer to our lipid clinic.  Weight gain -She actually appears very healthy, encouraged to continue exercising and eat healthy.  We will check TSH today.  Carotid stenosis -Status post carotid endarterectomy on the right.  No recent duplex, will obtain carotid arterial duplex bilateral.   Medication Adjustments/Labs and Tests Ordered: Current medicines are reviewed at length with the patient today.  Concerns regarding medicines are outlined above.  Orders Placed This Encounter  Procedures  . Comp Met (CMET)  . CBC w/Diff  . TSH  . Lipid Profile  . EKG 12-Lead   Meds ordered this encounter  Medications  . nadolol (CORGARD) 20 MG tablet    Sig: Take 0.5 tablets (10 mg total) by mouth daily.    Dispense:  30 tablet    Refill:  11    Patient Instructions  Medication Instructions:   DECREASE YOUR NADOLOL TO 10 MG ONCE DAILY  If you need a refill on your cardiac medications before your next appointment, please call your pharmacy.     Lab work:  TODAY--CMET, CBC W DIFF, TSH, AND LIPIDS If you have labs (blood work) drawn today and your tests are completely normal, you will receive your results only by: Marland Kitchen MyChart Message (if you have MyChart) OR . A paper copy in the mail If you have any lab test that is abnormal or we need to change your treatment, we will call you to review the results.    Testing/Procedures:  Your physician has requested that you have a carotid duplex. This test is an ultrasound of the  carotid arteries in your neck. It looks at blood flow through these arteries that supply the brain with blood. Allow one hour for this exam. There are no restrictions or  special instructions.     Follow-Up: At [facility], you and your health needs are our priority.  As part of our continuing mission to provide you with exceptional heart care, we have created designated Provider Care Teams.  These Care Teams include your primary Cardiologist (physician) and Advanced Practice Providers (APPs -  Physician Assistants and Nurse Practitioners) who all work together to provide you with the care you need, when you need it. You will need a follow up appointment in 1 years.  Please call our office 2 months in advance to schedule this appointment.  You may see Ena Dawley, MD or one of the following Advanced Practice Providers on your designated Care Team:   [place], PA-C Melina Copa, PA-C . Ermalinda Barrios, PA-C        Signed, Ena Dawley, MD  04/19/2018 9:32 AM    [place]

## 2018-04-19 NOTE — Telephone Encounter (Signed)
Was stopped in hallway by patient saying she was not seen in the order of her number in lab as directed. She was mad and said she was leaving because the girl inlab didn't do what she said and she doesn't want to wait any more. She gave her lab number to me and yelled in the door "tell the nurse that and I am leaving. And if she wants lab work she needs to reschedule it." I asked her name and nurses name.  She was seen by Dr. Meda Coffee this morning. I let Ivy and Dr. Meda Coffee know the patient did not stay for labs.

## 2018-04-20 DIAGNOSIS — R635 Abnormal weight gain: Secondary | ICD-10-CM

## 2018-04-20 DIAGNOSIS — R002 Palpitations: Secondary | ICD-10-CM

## 2018-04-20 DIAGNOSIS — I6529 Occlusion and stenosis of unspecified carotid artery: Secondary | ICD-10-CM

## 2018-04-20 DIAGNOSIS — I2510 Atherosclerotic heart disease of native coronary artery without angina pectoris: Secondary | ICD-10-CM

## 2018-04-20 DIAGNOSIS — I2583 Coronary atherosclerosis due to lipid rich plaque: Secondary | ICD-10-CM

## 2018-04-20 DIAGNOSIS — E7800 Pure hypercholesterolemia, unspecified: Secondary | ICD-10-CM

## 2018-04-21 ENCOUNTER — Other Ambulatory Visit: Payer: Medicare Other | Admitting: *Deleted

## 2018-04-21 DIAGNOSIS — I2583 Coronary atherosclerosis due to lipid rich plaque: Secondary | ICD-10-CM | POA: Diagnosis not present

## 2018-04-21 DIAGNOSIS — I2510 Atherosclerotic heart disease of native coronary artery without angina pectoris: Secondary | ICD-10-CM

## 2018-04-21 DIAGNOSIS — E7800 Pure hypercholesterolemia, unspecified: Secondary | ICD-10-CM | POA: Diagnosis not present

## 2018-04-21 DIAGNOSIS — R635 Abnormal weight gain: Secondary | ICD-10-CM | POA: Diagnosis not present

## 2018-04-21 DIAGNOSIS — R002 Palpitations: Secondary | ICD-10-CM | POA: Diagnosis not present

## 2018-04-21 DIAGNOSIS — I6529 Occlusion and stenosis of unspecified carotid artery: Secondary | ICD-10-CM

## 2018-04-22 ENCOUNTER — Ambulatory Visit (HOSPITAL_COMMUNITY)
Admission: RE | Admit: 2018-04-22 | Discharge: 2018-04-22 | Disposition: A | Discharge status: Home / Self Care | Payer: Medicare Other | Source: Ambulatory Visit | Attending: Cardiology | Admitting: Cardiology

## 2018-04-22 DIAGNOSIS — I6529 Occlusion and stenosis of unspecified carotid artery: Secondary | ICD-10-CM | POA: Diagnosis not present

## 2018-04-22 LAB — CBC WITH DIFFERENTIAL/PLATELET
Basophils Absolute: 0 10*3/uL (ref 0.0–0.2)
Basos: 1 %
EOS (ABSOLUTE): 0.2 10*3/uL (ref 0.0–0.4)
Eos: 5 %
Hematocrit: 33.3 % — ABNORMAL LOW (ref 34.0–46.6)
Hemoglobin: 11.5 g/dL (ref 11.1–15.9)
Immature Grans (Abs): 0 10*3/uL (ref 0.0–0.1)
Immature Granulocytes: 0 %
Lymphocytes Absolute: 1.4 10*3/uL (ref 0.7–3.1)
Lymphs: 33 %
MCH: 31.9 pg (ref 26.6–33.0)
MCHC: 34.5 g/dL (ref 31.5–35.7)
MCV: 93 fL (ref 79–97)
Monocytes Absolute: 0.4 10*3/uL (ref 0.1–0.9)
Monocytes: 9 %
Neutrophils Absolute: 2.3 10*3/uL (ref 1.4–7.0)
Neutrophils: 52 %
Platelets: 181 10*3/uL (ref 150–450)
RBC: 3.6 x10E6/uL — ABNORMAL LOW (ref 3.77–5.28)
RDW: 13 % (ref 11.7–15.4)
WBC: 4.4 10*3/uL (ref 3.4–10.8)

## 2018-04-22 LAB — LIPID PANEL
Chol/HDL Ratio: 3.3 ratio (ref 0.0–4.4)
Cholesterol, Total: 186 mg/dL (ref 100–199)
HDL: 57 mg/dL (ref >39)
LDL Calculated: 111 mg/dL — ABNORMAL HIGH (ref 0–99)
Triglycerides: 92 mg/dL (ref 0–149)
VLDL Cholesterol Cal: 18 mg/dL (ref 5–40)

## 2018-04-22 LAB — COMPREHENSIVE METABOLIC PANEL
ALT: 16 IU/L (ref 0–32)
AST: 28 IU/L (ref 0–40)
Albumin/Globulin Ratio: 2 (ref 1.2–2.2)
Albumin: 4.3 g/dL (ref 3.5–4.8)
Alkaline Phosphatase: 64 IU/L (ref 39–117)
BUN/Creatinine Ratio: 15 (ref 12–28)
BUN: 18 mg/dL (ref 8–27)
Bilirubin Total: 0.6 mg/dL (ref 0.0–1.2)
CO2: 23 mmol/L (ref 20–29)
Calcium: 9.6 mg/dL (ref 8.7–10.3)
Chloride: 102 mmol/L (ref 96–106)
Creatinine, Ser: 1.22 mg/dL — ABNORMAL HIGH (ref 0.57–1.00)
GFR calc Af Amer: 52 mL/min/{1.73_m2} — ABNORMAL LOW (ref >59)
GFR calc non Af Amer: 45 mL/min/{1.73_m2} — ABNORMAL LOW (ref >59)
Globulin, Total: 2.2 g/dL (ref 1.5–4.5)
Glucose: 100 mg/dL — ABNORMAL HIGH (ref 65–99)
Potassium: 4 mmol/L (ref 3.5–5.2)
Sodium: 140 mmol/L (ref 134–144)
Total Protein: 6.5 g/dL (ref 6.0–8.5)

## 2018-04-22 LAB — TSH: TSH: 2.55 u[IU]/mL (ref 0.450–4.500)

## 2018-05-07 ENCOUNTER — Other Ambulatory Visit: Payer: Self-pay | Admitting: Family Medicine

## 2018-05-07 DIAGNOSIS — Z1231 Encounter for screening mammogram for malignant neoplasm of breast: Secondary | ICD-10-CM

## 2018-06-07 DIAGNOSIS — J209 Acute bronchitis, unspecified: Secondary | ICD-10-CM | POA: Diagnosis not present

## 2018-06-22 ENCOUNTER — Ambulatory Visit
Admission: RE | Admit: 2018-06-22 | Discharge: 2018-06-22 | Disposition: A | Discharge status: Home / Self Care | Payer: Medicare Other | Source: Ambulatory Visit | Attending: Family Medicine | Admitting: Family Medicine

## 2018-06-22 ENCOUNTER — Other Ambulatory Visit: Payer: Self-pay

## 2018-06-22 DIAGNOSIS — K7460 Unspecified cirrhosis of liver: Secondary | ICD-10-CM | POA: Diagnosis not present

## 2018-06-22 DIAGNOSIS — Z1231 Encounter for screening mammogram for malignant neoplasm of breast: Secondary | ICD-10-CM | POA: Diagnosis not present

## 2018-06-22 DIAGNOSIS — K429 Umbilical hernia without obstruction or gangrene: Secondary | ICD-10-CM | POA: Diagnosis not present

## 2018-08-02 DIAGNOSIS — K7031 Alcoholic cirrhosis of liver with ascites: Secondary | ICD-10-CM | POA: Diagnosis not present

## 2018-08-02 DIAGNOSIS — I8511 Secondary esophageal varices with bleeding: Secondary | ICD-10-CM | POA: Diagnosis not present

## 2018-08-02 DIAGNOSIS — K429 Umbilical hernia without obstruction or gangrene: Secondary | ICD-10-CM | POA: Diagnosis not present

## 2018-08-17 ENCOUNTER — Other Ambulatory Visit (HOSPITAL_COMMUNITY): Payer: Self-pay | Admitting: Cardiology

## 2018-08-17 DIAGNOSIS — I6523 Occlusion and stenosis of bilateral carotid arteries: Secondary | ICD-10-CM

## 2018-08-20 ENCOUNTER — Other Ambulatory Visit: Payer: Self-pay | Admitting: Gastroenterology

## 2018-08-20 DIAGNOSIS — K7469 Other cirrhosis of liver: Secondary | ICD-10-CM

## 2018-08-27 ENCOUNTER — Ambulatory Visit
Admission: RE | Admit: 2018-08-27 | Discharge: 2018-08-27 | Disposition: A | Discharge status: Home / Self Care | Payer: Medicare Other | Source: Ambulatory Visit | Attending: Gastroenterology | Admitting: Gastroenterology

## 2018-08-27 DIAGNOSIS — K7469 Other cirrhosis of liver: Secondary | ICD-10-CM | POA: Diagnosis not present

## 2018-09-15 DIAGNOSIS — M9903 Segmental and somatic dysfunction of lumbar region: Secondary | ICD-10-CM | POA: Diagnosis not present

## 2018-09-15 DIAGNOSIS — M9902 Segmental and somatic dysfunction of thoracic region: Secondary | ICD-10-CM | POA: Diagnosis not present

## 2018-09-15 DIAGNOSIS — M5136 Other intervertebral disc degeneration, lumbar region: Secondary | ICD-10-CM | POA: Diagnosis not present

## 2018-09-15 DIAGNOSIS — M5137 Other intervertebral disc degeneration, lumbosacral region: Secondary | ICD-10-CM | POA: Diagnosis not present

## 2018-09-17 DIAGNOSIS — M5136 Other intervertebral disc degeneration, lumbar region: Secondary | ICD-10-CM | POA: Diagnosis not present

## 2018-09-17 DIAGNOSIS — M5137 Other intervertebral disc degeneration, lumbosacral region: Secondary | ICD-10-CM | POA: Diagnosis not present

## 2018-09-17 DIAGNOSIS — M9903 Segmental and somatic dysfunction of lumbar region: Secondary | ICD-10-CM | POA: Diagnosis not present

## 2018-09-17 DIAGNOSIS — M9902 Segmental and somatic dysfunction of thoracic region: Secondary | ICD-10-CM | POA: Diagnosis not present

## 2018-09-20 DIAGNOSIS — M5137 Other intervertebral disc degeneration, lumbosacral region: Secondary | ICD-10-CM | POA: Diagnosis not present

## 2018-09-20 DIAGNOSIS — M9902 Segmental and somatic dysfunction of thoracic region: Secondary | ICD-10-CM | POA: Diagnosis not present

## 2018-09-20 DIAGNOSIS — M9903 Segmental and somatic dysfunction of lumbar region: Secondary | ICD-10-CM | POA: Diagnosis not present

## 2018-09-20 DIAGNOSIS — M5136 Other intervertebral disc degeneration, lumbar region: Secondary | ICD-10-CM | POA: Diagnosis not present

## 2018-09-23 DIAGNOSIS — M5137 Other intervertebral disc degeneration, lumbosacral region: Secondary | ICD-10-CM | POA: Diagnosis not present

## 2018-09-23 DIAGNOSIS — M9902 Segmental and somatic dysfunction of thoracic region: Secondary | ICD-10-CM | POA: Diagnosis not present

## 2018-09-23 DIAGNOSIS — M5136 Other intervertebral disc degeneration, lumbar region: Secondary | ICD-10-CM | POA: Diagnosis not present

## 2018-09-23 DIAGNOSIS — M9903 Segmental and somatic dysfunction of lumbar region: Secondary | ICD-10-CM | POA: Diagnosis not present

## 2018-09-27 DIAGNOSIS — M9902 Segmental and somatic dysfunction of thoracic region: Secondary | ICD-10-CM | POA: Diagnosis not present

## 2018-09-27 DIAGNOSIS — M9903 Segmental and somatic dysfunction of lumbar region: Secondary | ICD-10-CM | POA: Diagnosis not present

## 2018-09-27 DIAGNOSIS — M5136 Other intervertebral disc degeneration, lumbar region: Secondary | ICD-10-CM | POA: Diagnosis not present

## 2018-09-27 DIAGNOSIS — M5137 Other intervertebral disc degeneration, lumbosacral region: Secondary | ICD-10-CM | POA: Diagnosis not present

## 2018-10-04 DIAGNOSIS — M9902 Segmental and somatic dysfunction of thoracic region: Secondary | ICD-10-CM | POA: Diagnosis not present

## 2018-10-04 DIAGNOSIS — M5137 Other intervertebral disc degeneration, lumbosacral region: Secondary | ICD-10-CM | POA: Diagnosis not present

## 2018-10-04 DIAGNOSIS — M9903 Segmental and somatic dysfunction of lumbar region: Secondary | ICD-10-CM | POA: Diagnosis not present

## 2018-10-04 DIAGNOSIS — M5136 Other intervertebral disc degeneration, lumbar region: Secondary | ICD-10-CM | POA: Diagnosis not present

## 2018-10-05 DIAGNOSIS — L918 Other hypertrophic disorders of the skin: Secondary | ICD-10-CM | POA: Diagnosis not present

## 2018-10-05 DIAGNOSIS — L80 Vitiligo: Secondary | ICD-10-CM | POA: Diagnosis not present

## 2018-10-05 DIAGNOSIS — L820 Inflamed seborrheic keratosis: Secondary | ICD-10-CM | POA: Diagnosis not present

## 2018-10-05 DIAGNOSIS — L821 Other seborrheic keratosis: Secondary | ICD-10-CM | POA: Diagnosis not present

## 2018-11-09 DIAGNOSIS — K429 Umbilical hernia without obstruction or gangrene: Secondary | ICD-10-CM | POA: Diagnosis not present

## 2018-11-09 DIAGNOSIS — K7460 Unspecified cirrhosis of liver: Secondary | ICD-10-CM | POA: Diagnosis not present

## 2018-11-09 DIAGNOSIS — Z1211 Encounter for screening for malignant neoplasm of colon: Secondary | ICD-10-CM | POA: Diagnosis not present

## 2018-11-16 DIAGNOSIS — K7460 Unspecified cirrhosis of liver: Secondary | ICD-10-CM | POA: Diagnosis not present

## 2018-11-24 DIAGNOSIS — K7030 Alcoholic cirrhosis of liver without ascites: Secondary | ICD-10-CM | POA: Diagnosis not present

## 2018-11-24 DIAGNOSIS — Z0000 Encounter for general adult medical examination without abnormal findings: Secondary | ICD-10-CM | POA: Diagnosis not present

## 2018-11-24 DIAGNOSIS — N183 Chronic kidney disease, stage 3 (moderate): Secondary | ICD-10-CM | POA: Diagnosis not present

## 2018-12-14 DIAGNOSIS — Z23 Encounter for immunization: Secondary | ICD-10-CM | POA: Diagnosis not present

## 2018-12-28 DIAGNOSIS — K7031 Alcoholic cirrhosis of liver with ascites: Secondary | ICD-10-CM | POA: Diagnosis not present

## 2018-12-28 DIAGNOSIS — K429 Umbilical hernia without obstruction or gangrene: Secondary | ICD-10-CM | POA: Diagnosis not present

## 2018-12-29 DIAGNOSIS — K7290 Hepatic failure, unspecified without coma: Secondary | ICD-10-CM | POA: Diagnosis not present

## 2018-12-29 DIAGNOSIS — K7031 Alcoholic cirrhosis of liver with ascites: Secondary | ICD-10-CM | POA: Diagnosis not present

## 2018-12-29 DIAGNOSIS — K429 Umbilical hernia without obstruction or gangrene: Secondary | ICD-10-CM | POA: Diagnosis not present

## 2019-01-23 DIAGNOSIS — K429 Umbilical hernia without obstruction or gangrene: Secondary | ICD-10-CM | POA: Diagnosis not present

## 2019-01-23 DIAGNOSIS — Z20828 Contact with and (suspected) exposure to other viral communicable diseases: Secondary | ICD-10-CM | POA: Diagnosis not present

## 2019-01-23 DIAGNOSIS — K7031 Alcoholic cirrhosis of liver with ascites: Secondary | ICD-10-CM | POA: Diagnosis not present

## 2019-01-26 DIAGNOSIS — K420 Umbilical hernia with obstruction, without gangrene: Secondary | ICD-10-CM | POA: Diagnosis not present

## 2019-01-26 DIAGNOSIS — Z79899 Other long term (current) drug therapy: Secondary | ICD-10-CM | POA: Diagnosis not present

## 2019-01-26 DIAGNOSIS — Z7982 Long term (current) use of aspirin: Secondary | ICD-10-CM | POA: Diagnosis not present

## 2019-01-26 DIAGNOSIS — K429 Umbilical hernia without obstruction or gangrene: Secondary | ICD-10-CM | POA: Diagnosis not present

## 2019-01-26 DIAGNOSIS — K7030 Alcoholic cirrhosis of liver without ascites: Secondary | ICD-10-CM | POA: Diagnosis not present

## 2019-01-26 DIAGNOSIS — Z87891 Personal history of nicotine dependence: Secondary | ICD-10-CM | POA: Diagnosis not present

## 2019-01-26 DIAGNOSIS — Z888 Allergy status to other drugs, medicaments and biological substances status: Secondary | ICD-10-CM | POA: Diagnosis not present

## 2019-02-23 DIAGNOSIS — Z09 Encounter for follow-up examination after completed treatment for conditions other than malignant neoplasm: Secondary | ICD-10-CM | POA: Diagnosis not present

## 2019-02-23 DIAGNOSIS — K7031 Alcoholic cirrhosis of liver with ascites: Secondary | ICD-10-CM | POA: Diagnosis not present

## 2019-02-23 DIAGNOSIS — K429 Umbilical hernia without obstruction or gangrene: Secondary | ICD-10-CM | POA: Diagnosis not present

## 2019-05-04 ENCOUNTER — Other Ambulatory Visit: Payer: Self-pay

## 2019-05-04 ENCOUNTER — Other Ambulatory Visit (HOSPITAL_COMMUNITY): Payer: Self-pay | Admitting: Cardiology

## 2019-05-04 ENCOUNTER — Ambulatory Visit (HOSPITAL_COMMUNITY)
Admission: RE | Admit: 2019-05-04 | Discharge: 2019-05-04 | Disposition: A | Discharge status: Home / Self Care | Payer: Medicare Other | Source: Ambulatory Visit | Attending: Cardiology | Admitting: Cardiology

## 2019-05-04 ENCOUNTER — Telehealth: Payer: Self-pay | Admitting: *Deleted

## 2019-05-04 DIAGNOSIS — I6523 Occlusion and stenosis of bilateral carotid arteries: Secondary | ICD-10-CM | POA: Diagnosis not present

## 2019-05-04 DIAGNOSIS — Z9889 Other specified postprocedural states: Secondary | ICD-10-CM

## 2019-05-04 NOTE — Telephone Encounter (Signed)
Pt has been notified of carotid results by phone with verbal understanding. Pt understands the range of plaque is 1-39% mild. Pt would like for Dr. Meda Coffee to tell her exactly how much plaque is in the left carotid. Pt states she has had carotid endarterectomy in [place] on the right in the past and is nervous the left side is creeping up in plaque. Pt states she does not have a vascular surgeon here in [place]. I said this would be a great time to talk to Dr. Meda Coffee in regards to her question about how much plaque as well as possible referral to vascular. Pt has appt 05/13/19 with Dr. Meda Coffee. Pt thanked me for the call and the help. The patient has been notified of the result and verbalized understanding.  All questions (if any) were answered. Julaine Hua, [facility] 05/04/2019 11:33 AM

## 2019-05-04 NOTE — Telephone Encounter (Signed)
-----   Message from Nuala Alpha, LPN sent at [id number] 11:03 AM EST -----  ----- Message ----- From: Dorothy Spark, MD Sent: 05/04/2019  11:01 AM EST To: Nuala Alpha, LPN  Stable minimal carotid plaque, follow-up in 1 year.

## 2019-05-13 ENCOUNTER — Encounter: Payer: Self-pay | Admitting: Cardiology

## 2019-05-13 ENCOUNTER — Ambulatory Visit (INDEPENDENT_AMBULATORY_CARE_PROVIDER_SITE_OTHER): Payer: Medicare Other | Admitting: Cardiology

## 2019-05-13 ENCOUNTER — Other Ambulatory Visit: Payer: Self-pay

## 2019-05-13 VITALS — BP 120/68 | HR 69 | Ht 66.0 in | Wt 167.2 lb

## 2019-05-13 DIAGNOSIS — I6521 Occlusion and stenosis of right carotid artery: Secondary | ICD-10-CM | POA: Diagnosis not present

## 2019-05-13 DIAGNOSIS — I2583 Coronary atherosclerosis due to lipid rich plaque: Secondary | ICD-10-CM | POA: Diagnosis not present

## 2019-05-13 DIAGNOSIS — Z789 Other specified health status: Secondary | ICD-10-CM

## 2019-05-13 DIAGNOSIS — I2510 Atherosclerotic heart disease of native coronary artery without angina pectoris: Secondary | ICD-10-CM | POA: Diagnosis not present

## 2019-05-13 DIAGNOSIS — E7800 Pure hypercholesterolemia, unspecified: Secondary | ICD-10-CM | POA: Diagnosis not present

## 2019-05-13 DIAGNOSIS — E785 Hyperlipidemia, unspecified: Secondary | ICD-10-CM

## 2019-05-13 LAB — CBC
Hematocrit: 37.8 % (ref 34.0–46.6)
Hemoglobin: 13 g/dL (ref 11.1–15.9)
MCH: 31.1 pg (ref 26.6–33.0)
MCHC: 34.4 g/dL (ref 31.5–35.7)
MCV: 90 fL (ref 79–97)
Platelets: 227 10*3/uL (ref 150–450)
RBC: 4.18 x10E6/uL (ref 3.77–5.28)
RDW: 12.1 % (ref 11.7–15.4)
WBC: 6.1 10*3/uL (ref 3.4–10.8)

## 2019-05-13 LAB — COMPREHENSIVE METABOLIC PANEL
ALT: 16 IU/L (ref 0–32)
AST: 25 IU/L (ref 0–40)
Albumin/Globulin Ratio: 1.9 (ref 1.2–2.2)
Albumin: 4.6 g/dL (ref 3.7–4.7)
Alkaline Phosphatase: 77 IU/L (ref 39–117)
BUN/Creatinine Ratio: 16 (ref 12–28)
BUN: 22 mg/dL (ref 8–27)
Bilirubin Total: 0.5 mg/dL (ref 0.0–1.2)
CO2: 24 mmol/L (ref 20–29)
Calcium: 9.8 mg/dL (ref 8.7–10.3)
Chloride: 101 mmol/L (ref 96–106)
Creatinine, Ser: 1.4 mg/dL — ABNORMAL HIGH (ref 0.57–1.00)
GFR calc Af Amer: 43 mL/min/{1.73_m2} — ABNORMAL LOW (ref >59)
GFR calc non Af Amer: 38 mL/min/{1.73_m2} — ABNORMAL LOW (ref >59)
Globulin, Total: 2.4 g/dL (ref 1.5–4.5)
Glucose: 106 mg/dL — ABNORMAL HIGH (ref 65–99)
Potassium: 4.1 mmol/L (ref 3.5–5.2)
Sodium: 141 mmol/L (ref 134–144)
Total Protein: 7 g/dL (ref 6.0–8.5)

## 2019-05-13 LAB — LIPID PANEL
Chol/HDL Ratio: 3.1 ratio (ref 0.0–4.4)
Cholesterol, Total: 209 mg/dL — ABNORMAL HIGH (ref 100–199)
HDL: 68 mg/dL (ref >39)
LDL Chol Calc (NIH): 124 mg/dL — ABNORMAL HIGH (ref 0–99)
Triglycerides: 95 mg/dL (ref 0–149)
VLDL Cholesterol Cal: 17 mg/dL (ref 5–40)

## 2019-05-13 LAB — TSH: TSH: 2.41 u[IU]/mL (ref 0.450–4.500)

## 2019-05-13 NOTE — Progress Notes (Signed)
Cardiology Office Note:    Date:  05/13/2019   ID:  Nicole Barr, DOB Feb 28, 1946, MRN [id number]  PCP:  Arta Silence, MD  Cardiologist:  Ena Dawley, MD  Electrophysiologist:  None   Referring MD: Arta Silence, MD   Reason for visit: 1-year follow-up  History of Present Illness:    Nicole Barr is a 74 y.o. female with a hx of alcoholic cirrhosis being evaluated for transplant, esophageal varices, non obst CAD and carotid artery disease s/p R CEA who presents to clinic for evaluation of weight gain and abdominal distension.   She has a h/o alcoholic cirrhosis (dx 6967) with esophageal varices, ascites and hepatic encephalopathy evaluated at [facility] for possible transplant and found not to be a candidate due to a positive stress test. As part of pre -transplant work up she underwent stress echocardiogram with positive ECG portion but negative echo portion. 2D ECHO 08/2014 showed normal EF, normal diastolic function, trivial TR, and trivial pericardial effusion. She saw Dr. Meda Coffee in 09/2014 fo further evaluation.  She underwent a coronary CTA 10/2014 that showed 50-69% stenosis and in the proximal LAD, calcium score of 335. Because of her cirrhosis, she wasn't started on any statins. Per Dr. Francesca Oman last note, she was not a transplant candidate at that time as her MALT score was 9.  Review of phone notes reveal that she walked in the office demanding to be seen emergently for a reported weight gain of 40 lbs and worsening abdominal distension different than her normal ascites. She was sure it was related to her nadolol. Dr. Meda Coffee recommended discontinuing nadolol and increasing lasix to 55m daily with a BMET in 2-3 weeks and APP f/u.   04/19/2018 -she is coming after 3 years, denies any chest pain or shortness of breath, she exercises 5 times a week, and her biggest complaint is weight gain, she is 5 pounds lighter than 3 years ago when I last saw her.  She  states that she used to have 130 pounds but ever since she was diagnosed with alcoholic cirrhosis her weight went up.  She cooks for herself eats very healthy limits her food portions but has hard time losing her weight anyway.  She has no palpitation dizziness or syncope.  05/13/2019, the patient, she has the same complaints of abdominal distention and 30 pound weight gain despite stable weight in the last 3 years.  She states that she walks daily several miles without any chest pain shortness of breath dizziness palpitation or syncope.  She has been compliant with her medications.  Past Medical History:  Diagnosis Date  . Alcoholic cirrhosis ([place]   . Ascites   . Cirrhosis ([place]   . Endometriosis   . GERD (gastroesophageal reflux disease)   . Liver cyst     Past Surgical History:  Procedure Laterality Date  . CAROTID ENDARTERECTOMY Right 2008  . egd with banding  2016   with bleeding after wards done at [facility]  . ESOPHAGOGASTRODUODENOSCOPY (EGD) WITH PROPOFOL N/A 05/02/2015   Procedure: ESOPHAGOGASTRODUODENOSCOPY (EGD) WITH PROPOFOL;  Surgeon: WArta Silence MD;  Location: [facility];  Service: Endoscopy;  Laterality: N/A;  . ESOPHAGOGASTRODUODENOSCOPY (EGD) WITH PROPOFOL N/A 06/06/2015   Procedure: ESOPHAGOGASTRODUODENOSCOPY (EGD) WITH PROPOFOL;  Surgeon: WArta Silence MD;  Location: [facility];  Service: Endoscopy;  Laterality: N/A;  . GASTRIC VARICES BANDING N/A 05/02/2015   Procedure: GASTRIC VARICES BANDING;  Surgeon: WArta Silence MD;  Location: [facility];  Service: Endoscopy;  Laterality: N/A;  .  HEMORROIDECTOMY    . LAMINECTOMY  1989   lumbar 4-5  . lapaendectomy  yrs ago  . LAPAROSCOPY  1989   adhesions  . oophrectomy Left 1989  . TONSILLECTOMY     age 69    Current Medications: Current Meds  Medication Sig  . aspirin 81 MG EC tablet Take 81 mg by mouth 2 (two) times a week.  . furosemide (LASIX) 20 MG tablet Take 40 mg by mouth daily.  Marland Kitchen lactulose  (CHRONULAC) 10 GM/15ML solution 3-4 tablespoons daily by mouth  . Multiple Vitamins-Minerals (MULTIVITAMIN WITH MINERALS) tablet Take 1 tablet by mouth daily.  . nadolol (CORGARD) 20 MG tablet Take 20 mg by mouth daily.  . polyethylene glycol powder (GLYCOLAX/MIRALAX) 17 GM/SCOOP powder Take by mouth.  . rifaximin (XIFAXAN) 550 MG TABS tablet Take 550 mg by mouth 2 (two) times daily.  Marland Kitchen spironolactone (ALDACTONE) 50 MG tablet Take 50 mg by mouth 2 (two) times daily.     Allergies:   Carvedilol   Social History   Socioeconomic History  . Marital status: Single    Spouse name: Not on file  . Number of children: Not on file  . Years of education: Not on file  . Highest education level: Not on file  Occupational History  . Not on file  Tobacco Use  . Smoking status: Former Smoker    Start date: 04/17/1968    Quit date: 04/17/1973    Years since quitting: 46.1  . Smokeless tobacco: Never Used  Substance and Sexual Activity  . Alcohol use: No    Alcohol/week: 10.0 standard drinks    Types: 10 Glasses of wine per week    Comment: currently states she is not drinking but states she usually had 2 glasses of wine per evening  . Drug use: No  . Sexual activity: Not on file  Other Topics Concern  . Not on file  Social History Narrative  . Not on file   Social Determinants of Health   Financial Resource Strain:   . Difficulty of Paying Living Expenses: Not on file  Food Insecurity:   . Worried About Charity fundraiser in the Last Year: Not on file  . Ran Out of Food in the Last Year: Not on file  Transportation Needs:   . Lack of Transportation (Medical): Not on file  . Lack of Transportation (Non-Medical): Not on file  Physical Activity:   . Days of Exercise per Week: Not on file  . Minutes of Exercise per Session: Not on file  Stress:   . Feeling of Stress : Not on file  Social Connections:   . Frequency of Communication with Friends and Family: Not on file  . Frequency of  Social Gatherings with Friends and Family: Not on file  . Attends Religious Services: Not on file  . Active Member of Clubs or Organizations: Not on file  . Attends Archivist Meetings: Not on file  . Marital Status: Not on file     Family History: The patient's family history includes Cancer in her mother; Diabetes in her mother; Heart failure in her father and maternal grandmother; Hyperlipidemia in her mother; Hypertension in her mother; Lupus in her mother; Vitiligo in her mother.  ROS:   Please see the history of present illness.    All other systems reviewed and are negative.  EKGs/Labs/Other Studies Reviewed:    The following studies were reviewed today:  EKG:  EKG is ordered today.  The ekg ordered today demonstrates sinus rhythm, normal EKG, unchanged from prior.  This was personally reviewed.  Recent Labs: No results found for requested labs within last 8760 hours.  Recent Lipid Panel    Component Value Date/Time   CHOL 186 04/21/2018 0734   TRIG 92 04/21/2018 0734   HDL 57 04/21/2018 0734   CHOLHDL 3.3 04/21/2018 0734   LDLCALC 111 (H) 04/21/2018 0734   Physical Exam:    VS:  BP 120/68   Pulse 69   Ht '5\' 6"'  (1.676 m)   Wt 167 lb 3.2 oz (75.8 kg)   SpO2 95%   BMI 26.99 kg/m     Wt Readings from Last 3 Encounters:  05/13/19 167 lb 3.2 oz (75.8 kg)  04/19/18 161 lb 3.2 oz (73.1 kg)  01/26/17 156 lb (70.8 kg)    GEN: Well nourished, well developed in no acute distress HEENT: Normal NECK: No JVD; No carotid bruits LYMPHATICS: No lymphadenopathy CARDIAC: RRR, no murmurs, rubs, gallops RESPIRATORY:  Clear to auscultation without rales, wheezing or rhonchi  ABDOMEN: Soft, non-tender, non-distended MUSCULOSKELETAL:  No edema; No deformity  SKIN: Warm and dry NEUROLOGIC:  Alert and oriented x 3 PSYCHIATRIC:  Normal affect   Coronary CTA 10/25/2014 IMPRESSION: 1. Coronary calcium score of 335. This was 26 percentile for age and sex matched  control. 2. Normal coronary origin. There is right and left co-dominance. 3. There is moderate non-obstructive CAD in the proximal LAD. Diffuse calcified plaque. An aggressive risk factor modification is recommended.  2D ECHO 08/2014 ([facility]) showed normal EF, normal diastolic function, trivial TR, and trivial pericardial effusion.  ASSESSMENT:    1. Hypercholesterolemia without hypertriglyceridemia   2. Coronary artery disease due to lipid rich plaque   3. Hyperlipidemia, unspecified hyperlipidemia type   4. Statin intolerance    PLAN:    In order of problems listed above:  Moderate nonobstructive CAD -Appears to be stable, she is asymptomatic, she cannot take statin because of cirrhosis.  We will refer her to the lipid clinic as she need to be on lipid-lowering agent given diagnosis of nonobstructive CAD and prior carotid endarterectomy.  She is hesitant but agrees. -She is encouraged to continue exercise and eat healthy.  Weight gain The patient appears healthy, her TSH is normal, despite complain of significant weight gain she has had stable weight in the last 3 years.  She is inquiring about weight loss pills and is advised against it.  Carotid stenosis -Status post carotid endarterectomy on the right.  She underwent bilateral carotid duplex in January 2021 with minimal plaque bilaterally, antegrade flow in the vertebral artery.  We will repeat in 1 year.  Medication Adjustments/Labs and Tests Ordered: Current medicines are reviewed at length with the patient today.  Concerns regarding medicines are outlined above.  Orders Placed This Encounter  Procedures  . Comp Met (CMET)  . CBC  . TSH  . Lipid Profile  . AMB Referral to Advanced Lipid Disorders Clinic  . EKG 12-Lead   No orders of the defined types were placed in this encounter.   Patient Instructions  Medication Instructions:   Your physician recommends that you continue on your current medications  as directed. Please refer to the Current Medication list given to you today.  *If you need a refill on your cardiac medications before your next appointment, please call your pharmacy*   Lab Work:  TODAY--CMET, CBC, TSH, AND LIPIDS  If you have labs (blood work)  drawn today and your tests are completely normal, you will receive your results only by: Marland Kitchen MyChart Message (if you have MyChart) OR . A paper copy in the mail If you have any lab test that is abnormal or we need to change your treatment, we will call you to review the results.   You have been referred to OUR ADVANCED LIPID CLINIC WITH OUR PHARMACIST FOR STATIN INTOLERANCE AND KNOWN CAD   Follow-Up: At [facility], you and your health needs are our priority.  As part of our continuing mission to provide you with exceptional heart care, we have created designated Provider Care Teams.  These Care Teams include your primary Cardiologist (physician) and Advanced Practice Providers (APPs -  Physician Assistants and Nurse Practitioners) who all work together to provide you with the care you need, when you need it.  Your next appointment:   6 month(s)  The format for your next appointment:   In Person  Provider:   Ena Dawley, MD       Signed, Ena Dawley, MD  05/13/2019 1:22 PM    [place]

## 2019-05-13 NOTE — Patient Instructions (Signed)
Medication Instructions:   Your physician recommends that you continue on your current medications as directed. Please refer to the Current Medication list given to you today.  *If you need a refill on your cardiac medications before your next appointment, please call your pharmacy*   Lab Work:  TODAY--CMET, CBC, TSH, AND LIPIDS  If you have labs (blood work) drawn today and your tests are completely normal, you will receive your results only by: Marland Kitchen MyChart Message (if you have MyChart) OR . A paper copy in the mail If you have any lab test that is abnormal or we need to change your treatment, we will call you to review the results.   You have been referred to OUR ADVANCED LIPID CLINIC WITH OUR PHARMACIST FOR STATIN INTOLERANCE AND KNOWN CAD   Follow-Up: At [facility], you and your health needs are our priority.  As part of our continuing mission to provide you with exceptional heart care, we have created designated Provider Care Teams.  These Care Teams include your primary Cardiologist (physician) and Advanced Practice Providers (APPs -  Physician Assistants and Nurse Practitioners) who all work together to provide you with the care you need, when you need it.  Your next appointment:   6 month(s)  The format for your next appointment:   In Person  Provider:   Ena Dawley, MD

## 2019-05-15 NOTE — Progress Notes (Signed)
Patient ID: Nicole Barr                 DOB: Apr 24, 1945                    MRN: [id number]     HPI: NEVAEN TRAMA is a 74 y.o. female patient referred to lipid clinic by Dr. Meda Coffee. PMH is significant for hx of alcoholic cirrhosis, hx of esophageal varices, CAD (s/p R CEA), peripheral neuropathy, depression, endometriosis, HLD, and lung nodule. Patient had a MALT score of 9 on 04/04/15 and is not a candidate for a transplant. She underwent a coronary CTA 10/2014 that showed 50-69% stenosis and in the proximal LAD, calcium score of 335 (This was 1 percentile for age and sex matched control).  Because of her cirrhosis, she wasn't started on any statins.   Patient presents today for initial appt with lipid clinic. She states she was on Lipitor "a long time" ago and it "messed up her liver/increased her liver panel". She thinks Lipitor is a horrible medication. She does not remember the dose of Lipitor. She states she has not been on any other cholesterol medication since Lipitor. She states she gets all of her medications via mail and prefers a 90 day supply since it is more affordable via her prescription insurance. Patient states she pays >$1000 per month for Xifaxan prescription and that cost is a barrier to be considerate of.   Insurance Information [facility] Medicare) Member: KAJA KEENER  Member ID: [id number] Plan: [id number]  RxBIN: [id number] RxPCN: [id number] RxGroup: [id number]  Current Medications: none Intolerances: Lipitor (likely increase in LFTs) Risk Factors: CAD (s/p R CEA), HLD, calcium score 335 (8  8% for age and sex matched control), BMI, former smoker, family history LDL goal: < 70 mg/dL  Diet: vegetable based diet, fruit, chicken No pre-processed foods, fast foods, fried foods Seldomly eats sweets (cookies, apple cobbler)  Exercise: goes 4-5x per week for 2.5 hours   Family History: The patient's family history includes Cancer in her mother; Diabetes in her  mother; Heart failure in her father and maternal grandmother; Arteriosclerosis in maternal grandmother; Hyperlipidemia in her mother; Hypertension in her mother; Lupus in her mother; Vitiligo in her mother.  Social History: former smoker (quit 1970s), no alcohol use   Labs: 05/13/19 TC 209 TG 95 HDL 68 VLDL 17 LDL 124; none 04/21/18 TC 186 TG 92 HDL 57 VLDL 18 LDL 111; none  Past Medical History:  Diagnosis Date  . Alcoholic cirrhosis ([place])   . Ascites   . Cirrhosis ([place])   . Endometriosis   . GERD (gastroesophageal reflux disease)   . Liver cyst     Current Outpatient Medications on File Prior to Visit  Medication Sig Dispense Refill  . aspirin 81 MG EC tablet Take 81 mg by mouth 2 (two) times a week.    . furosemide (LASIX) 20 MG tablet Take 40 mg by mouth daily.    Marland Kitchen lactulose (CHRONULAC) 10 GM/15ML solution 3-4 tablespoons daily by mouth    . Multiple Vitamins-Minerals (MULTIVITAMIN WITH MINERALS) tablet Take 1 tablet by mouth daily.    . nadolol (CORGARD) 20 MG tablet Take 20 mg by mouth daily.    . polyethylene glycol powder (GLYCOLAX/MIRALAX) 17 GM/SCOOP powder Take by mouth.    . rifaximin (XIFAXAN) 550 MG TABS tablet Take 550 mg by mouth 2 (two) times daily.    Marland Kitchen spironolactone (ALDACTONE) 50 MG tablet Take 50 mg by  mouth 2 (two) times daily.     No current facility-administered medications on file prior to visit.    Allergies  Allergen Reactions  . Carvedilol Swelling and Other (See Comments)    Other reaction(s): Hypertension (intolerance), Rapid Heart Rate Increases BP to 300 - per pt    Assessment/Plan:  1. Hyperlipidemia - LDL goal < 70 mg/dL; therefore, patient is not at goal. While there is some literature to support statin use (specifically atorvastatin and simvastatin) in patients with cirrhosis but will avoid statins considering patient reports she had a prior increase in LFTs with atorvastatin. Bempedoic acid, Zetia, and Nexlizet are not potent enough to  lower LDL to goal. PCSK9 inhibitors are the remaining option that are potent enough to lower LDL and have ASCVD benefit. Plan to initiate Repatha 140 mg subQ every 14 days. Thoroughly counseled patient on mechanism of action, efficacy, side effects, dosing, and administration. Patient verbalized understanding. Offered patient to administer first injection in office - patient politely declined. Provided Repatha patient education handout. Will pursue prior authorization for medication and apply to [facility] to ensure copay is $0. Plan to call patient once prior authorization and [place] are approved to provide her a status update about obtaining Repatha. Will also schedule lipid panel/LFTs at this time. Patient verbalized understanding to the plan and confirmed her demographics (address, phone number).  Thank you for involving pharmacy to assist in providing this patient's care.   Drexel Iha, PharmD PGY2 Ambulatory Care Pharmacy Resident

## 2019-05-17 ENCOUNTER — Ambulatory Visit (INDEPENDENT_AMBULATORY_CARE_PROVIDER_SITE_OTHER): Payer: Medicare Other | Admitting: Pharmacist

## 2019-05-17 ENCOUNTER — Other Ambulatory Visit: Payer: Self-pay

## 2019-05-17 DIAGNOSIS — E7800 Pure hypercholesterolemia, unspecified: Secondary | ICD-10-CM

## 2019-05-17 DIAGNOSIS — I6521 Occlusion and stenosis of right carotid artery: Secondary | ICD-10-CM

## 2019-05-17 NOTE — Patient Instructions (Signed)
It was a pleasure seeing you in clinic today Ms. Thurlow!  Today the plan is... 1. We will plan to start (evolocumab) Repatha 140 mg subQ every 14 days. I have provided you a handout to refer to when you administer it. 2. I will have to pursue a prior authorization through your insurance for [place]. Once approved, I will apply to [facility] to make sure your copay is $0. After the entire process is complete, a pharmacist will call you and let you know when you are able to obtain Repatha prescription.  Please call the PharmD clinic at [phone] if you have any questions that you would like to speak with a pharmacist about [facility], [place], [place]).

## 2019-05-19 ENCOUNTER — Telehealth: Payer: Self-pay | Admitting: Pharmacist

## 2019-05-19 NOTE — Telephone Encounter (Signed)
Patient called stating she was reading the paperwork on Repatha and saw that it can increase your blood sugars and cause diabetes. I did explain that this is rare. But she states "she cant afford that" She states that she would like to talk to her GI doctor before starting. She asks that we not order med and she will be back in contact with [place].

## 2019-06-14 ENCOUNTER — Telehealth: Payer: Self-pay | Admitting: Pharmacist

## 2019-06-14 NOTE — Telephone Encounter (Signed)
Called patient on 06/14/2019 at 4:36 PM   Asked patient if she had scheduled an appt with provider managing her cirrhosis to determine provider's input/option on medications available for HLD. Previously she has not been started on statins due to cirrhosis. Patient states she has made an appt with her provider and will be receiving a call back from an office receptionist who will schedule the appt within the next 2 weeks.   Advised patient to contact clinic after upcoming appt with provider managing cirrhosis to further discuss HLD management.  Thank you for involving pharmacy to assist in providing this patient's care.   Drexel Iha, PharmD PGY2 Ambulatory Care Pharmacy Resident

## 2019-06-15 ENCOUNTER — Other Ambulatory Visit: Payer: Self-pay | Admitting: Gastroenterology

## 2019-06-15 DIAGNOSIS — K7460 Unspecified cirrhosis of liver: Secondary | ICD-10-CM

## 2019-06-16 ENCOUNTER — Ambulatory Visit
Admission: RE | Admit: 2019-06-16 | Discharge: 2019-06-16 | Disposition: A | Discharge status: Home / Self Care | Payer: Medicare Other | Source: Ambulatory Visit | Attending: Gastroenterology | Admitting: Gastroenterology

## 2019-06-16 DIAGNOSIS — K7460 Unspecified cirrhosis of liver: Secondary | ICD-10-CM

## 2019-06-16 DIAGNOSIS — K7689 Other specified diseases of liver: Secondary | ICD-10-CM | POA: Diagnosis not present

## 2019-06-16 NOTE — Telephone Encounter (Signed)
Pt called clinic and left a message stating she had another ultrasound today. She will see her doctor on 3/31 at 4pm and plans to discuss Repatha with her physician at that time. She will be in touch with [place] after that.

## 2019-07-01 DIAGNOSIS — R05 Cough: Secondary | ICD-10-CM | POA: Diagnosis not present

## 2019-07-06 DIAGNOSIS — K7290 Hepatic failure, unspecified without coma: Secondary | ICD-10-CM | POA: Diagnosis not present

## 2019-07-06 DIAGNOSIS — K7460 Unspecified cirrhosis of liver: Secondary | ICD-10-CM | POA: Diagnosis not present

## 2019-07-15 ENCOUNTER — Telehealth: Payer: Self-pay | Admitting: Pharmacist

## 2019-07-15 DIAGNOSIS — I2510 Atherosclerotic heart disease of native coronary artery without angina pectoris: Secondary | ICD-10-CM

## 2019-07-15 MED ORDER — PRALUENT 75 MG/ML ~~LOC~~ SOAJ: 75.0000 mg | SUBCUTANEOUS | 11 refills | Status: DC

## 2019-07-15 NOTE — Telephone Encounter (Signed)
Called patient on 07/15/2019 at 12:54 PM   Discussed with patient that she had an appt with Dr. Paulita Fujita. She stated he did not tell her his opinion on hyperlipidemia medication. She would like me to speak with Dr. Paulita Fujita prior to initiation of hyperlipidemia medication.  Messaged Dr. Paulita Fujita via [place]. He confirms he is comfortable with her on any hyperlipidemia medication considering her cirrhosis is stable. Appreciate his recommendations.  Completed prior authorization for Praluent via covermymeds. Response from insurance was that Praluent does not require prior authorization.  Sent Praluent rx to patient's preferred pharmacy ([facility] on [address]). Called pharmacy and spoke with pharmacy staff member who confirmed Praluent copay for 30-day supply would cost $429.20.  Called patient to provide status update regarding PCSK9 inhibitor therapy. Applied for [employer]. Patient is approved for [employer] from 06/15/19 to 06/13/20. Her [employer] ID is [id number].  [place] [id number] PCN [id number] RxGroup [id number] Rx ID [id number]  Called pharmacy and provided pharmacy staff member with [facility] information. Pharmacy staff member was able to bill [place] information appropriately and confirmed copay would be $0. Praluent is in stock and patient is able to obtain medication from pharmacy in ~1 hour.  Called [place] to provide her with update. Scheduled office visit appt for 08/02/19 at 8:30 AM for initial injection and to discuss any lingering questions related to Praluent. Patient verbalized understanding and expressed appreciation.   Thank you for involving pharmacy to assist in providing this patient's care.   Drexel Iha, PharmD PGY2 Ambulatory Care Pharmacy Resident

## 2019-07-21 ENCOUNTER — Other Ambulatory Visit: Payer: Self-pay | Admitting: Family Medicine

## 2019-07-21 DIAGNOSIS — Z1231 Encounter for screening mammogram for malignant neoplasm of breast: Secondary | ICD-10-CM

## 2019-07-27 NOTE — Progress Notes (Signed)
Patient ID: MIN COPELAND                 DOB: 1945-06-28                    MRN: [id number]     HPI: Nicole Barr is a 74 y.o. female patient referred to lipid clinic by Dr. Meda Coffee. PMH is significant for hx of alcoholic cirrhosis, hx of esophageal varices, CAD (s/p R CEA), peripheral neuropathy, depression, endometriosis, HLD, and lung nodule. Patient had a MALT score of 9 on 04/04/15 and is not a candidate for a transplant. She underwent a coronary CTA 10/2014 that showed 50-69% stenosis and in the proximal LAD, calcium score of 335 (This was 98 percentile for age and sex matched control).  Because of her cirrhosis, she wasn't started on any statins.   Patient has been seen previously in lipid clinic (05/17/19). She stated she was on Lipitor "a long time" ago and it "messed up her liver/increased her liver panel". She stated she gets all of her medications via mail and prefers a 90 day supply since it is more affordable via her prescription insurance. Patient stated she pays >$1000 per month for Xifaxan prescription and that cost is a barrier to be considerate of.   Patient had concerns starting HLD medication considering her hx of alcoholic cirrhosis and esophageal varices. Messaged patient's gastroenterologist, Dr. Paulita Fujita, via Epic. He confirms he is comfortable with her on any hyperlipidemia medication considering her cirrhosis is stable. Appreciate his recommendations. Patient does not require PA via her insurance for Praluent. Sent in [place] rx. Pharmacy staff member confirmed [place] for 30-day supply would cost $429.20. Applied for [employer] for patient. She was approved. [place] provided to pharmacy and pharmacy staff member confirmed cost $0 for 30 day supply. Patient instructed to obtain Praluent from pharmacy. Patient verbalized understanding.   Patient presents for follow up appt for Praluent initiation. She obtained Praluent from  pharmacy successfully without issues. She has brought the [employer] package insert which she has underlined key points about administration of Praluent.  Insurance Information [facility] Medicare) Member: Nicole Barr  Member ID: [id number] Plan: [phone]  RxBIN: [id number] RxPCN: [id number] RxGroup: [id number]  Current Medications: none Intolerances: Lipitor (likely increase in LFTs) Risk Factors: CAD (s/p R CEA), HLD, calcium score 335 (8  8% for age and sex matched control), BMI, former smoker, family history LDL goal: < 70 mg/dL  Diet: vegetable based diet, fruit, chicken No pre-processed foods, fast foods, fried foods Seldomly eats sweets (cookies, apple cobbler)  Exercise: goes 4-5x per week for 2.5 hours   Family History: The patient'sfamily history includes Cancer in her mother; Diabetes in her mother; Heart failure in her father and maternal grandmother; Arteriosclerosis in maternal grandmother; Hyperlipidemia in her mother; Hypertension in her mother; Lupus in her mother; Vitiligo in her mother.  Social History: former smoker (quit 1970s), no alcohol use   Labs: 05/13/19 TC 209 TG 95 HDL 68 VLDL 17 LDL 124; none 04/21/18 TC 186 TG 92 HDL 57 VLDL 18 LDL 111; none  Past Medical History:  Diagnosis Date  . Alcoholic cirrhosis ([place])   . Ascites   . Cirrhosis ([place])   . Endometriosis   . GERD (gastroesophageal reflux disease)   . Liver cyst     Current Outpatient Medications on File Prior to Visit  Medication Sig Dispense Refill  . Alirocumab (PRALUENT) 75 MG/ML SOAJ Inject 75 mg into  the skin every 14 (fourteen) days. 2 pen 11  . aspirin 81 MG EC tablet Take 81 mg by mouth 2 (two) times a week.    . furosemide (LASIX) 20 MG tablet Take 20 mg by mouth 3 (three) times daily.     Marland Kitchen lactulose (CHRONULAC) 10 GM/15ML solution 3-4 tablespoons daily by mouth    . Multiple Vitamins-Minerals (MULTIVITAMIN WITH MINERALS) tablet Take 1 tablet by mouth once a week.     . nadolol  (CORGARD) 20 MG tablet Take 20 mg by mouth daily.    . polyethylene glycol powder (GLYCOLAX/MIRALAX) 17 GM/SCOOP powder Take by mouth.    . rifaximin (XIFAXAN) 550 MG TABS tablet Take 550 mg by mouth 2 (two) times daily.    Marland Kitchen spironolactone (ALDACTONE) 50 MG tablet Take 50 mg by mouth 3 (three) times daily.      No current facility-administered medications on file prior to visit.    Allergies  Allergen Reactions  . Carvedilol Other (See Comments)    Other reaction(s): Hypertension (intolerance), Rapid Heart Rate Increases BP to 300 - per pt    Assessment/Plan:  1. Hyperlipidemia - LDL goal < 70 mg/dL; therefore, patient is not at goal. Will avoid statins considering patient reports she had a prior increase in LFTs with atorvastatin. Patient does have a history of cirrhosis, however, discussed her case with her gastroenterologist, Dr. Paulita Fujita, and he feels that her cirrhosis is currently stable therefore she is safe to initiate a HLD medication.  Bempedoic acid, Zetia, and Nexlizet are not potent enough to lower LDL to goal. PCSK9 inhibitors are the remaining option that are potent enough to lower LDL and have ASCVD benefit. Insurance prefers Praluent > Repatha. Initiate Praluent 75 mg subQ every 14 days. Counseled patient thoroughly on mechanism of action, efficacy, side efficacy, dosing, and administration of Praluent. Patient verbalized understanding. Patient confirms she was able to successfully obtain Praluent from pharmacy for $0 copay. Patient successfully administered first dose on right side of abdomenof Praluent in office without issue. Patient confirms she is confident to administer future Praluent doses independently. Scheduled follow up labs for June 29th 2021 at 8:30 AM. Stressed to patient these are fasting labs. Patient verbalized understanding.  Thank you for involving pharmacy to assist in providing this patient's care.   Drexel Iha, PharmD PGY2 Ambulatory Care Pharmacy  Resident

## 2019-08-02 ENCOUNTER — Ambulatory Visit (INDEPENDENT_AMBULATORY_CARE_PROVIDER_SITE_OTHER): Payer: Medicare Other | Admitting: Pharmacist

## 2019-08-02 ENCOUNTER — Other Ambulatory Visit: Payer: Self-pay

## 2019-08-02 DIAGNOSIS — I6521 Occlusion and stenosis of right carotid artery: Secondary | ICD-10-CM | POA: Diagnosis not present

## 2019-08-02 DIAGNOSIS — E7800 Pure hypercholesterolemia, unspecified: Secondary | ICD-10-CM

## 2019-08-02 NOTE — Patient Instructions (Addendum)
It was a pleasure seeing you in clinic today Nicole Barr!  Today the plan is... 1. Continue Praluent every 2 weeks 2. Call if you have issues with Repatha 3. Follow up on June 29th at 8:30 AM for cholesterol labs. Make sure you are fasting (no food or water) beforehand  Please call the PharmD clinic at [phone] if you have any questions that you would like to speak with a pharmacist about Nicole Barr, [place], [place]).

## 2019-08-09 ENCOUNTER — Ambulatory Visit
Admission: RE | Admit: 2019-08-09 | Discharge: 2019-08-09 | Disposition: A | Discharge status: Home / Self Care | Payer: Medicare Other | Source: Ambulatory Visit | Attending: Family Medicine | Admitting: Family Medicine

## 2019-08-09 ENCOUNTER — Other Ambulatory Visit: Payer: Self-pay

## 2019-08-09 DIAGNOSIS — Z1231 Encounter for screening mammogram for malignant neoplasm of breast: Secondary | ICD-10-CM | POA: Diagnosis not present

## 2019-08-10 ENCOUNTER — Other Ambulatory Visit: Payer: Self-pay | Admitting: Family Medicine

## 2019-08-10 DIAGNOSIS — R928 Other abnormal and inconclusive findings on diagnostic imaging of breast: Secondary | ICD-10-CM

## 2019-08-12 ENCOUNTER — Ambulatory Visit: Payer: Medicare Other

## 2019-08-16 ENCOUNTER — Other Ambulatory Visit: Payer: Self-pay

## 2019-08-16 ENCOUNTER — Ambulatory Visit
Admission: RE | Admit: 2019-08-16 | Discharge: 2019-08-16 | Disposition: A | Discharge status: Home / Self Care | Payer: Medicare Other | Source: Ambulatory Visit | Attending: Family Medicine | Admitting: Family Medicine

## 2019-08-16 ENCOUNTER — Ambulatory Visit: Admission: RE | Admit: 2019-08-16 | Payer: Medicare Other | Source: Ambulatory Visit

## 2019-08-16 DIAGNOSIS — R928 Other abnormal and inconclusive findings on diagnostic imaging of breast: Secondary | ICD-10-CM | POA: Diagnosis not present

## 2019-09-28 ENCOUNTER — Other Ambulatory Visit: Payer: Medicare Other | Admitting: *Deleted

## 2019-09-28 ENCOUNTER — Other Ambulatory Visit: Payer: Self-pay

## 2019-09-28 ENCOUNTER — Other Ambulatory Visit: Payer: Self-pay | Admitting: *Deleted

## 2019-09-28 DIAGNOSIS — I2510 Atherosclerotic heart disease of native coronary artery without angina pectoris: Secondary | ICD-10-CM

## 2019-09-28 DIAGNOSIS — I2583 Coronary atherosclerosis due to lipid rich plaque: Secondary | ICD-10-CM | POA: Diagnosis not present

## 2019-09-28 LAB — LIPID PANEL
Chol/HDL Ratio: 2.4 ratio (ref 0.0–4.4)
Cholesterol, Total: 142 mg/dL (ref 100–199)
HDL: 58 mg/dL (ref >39)
LDL Chol Calc (NIH): 68 mg/dL (ref 0–99)
Triglycerides: 87 mg/dL (ref 0–149)
VLDL Cholesterol Cal: 16 mg/dL (ref 5–40)

## 2019-09-28 LAB — HEPATIC FUNCTION PANEL
ALT: 16 IU/L (ref 0–32)
AST: 22 IU/L (ref 0–40)
Albumin: 4.4 g/dL (ref 3.7–4.7)
Alkaline Phosphatase: 69 IU/L (ref 48–121)
Bilirubin Total: 0.6 mg/dL (ref 0.0–1.2)
Bilirubin, Direct: 0.19 mg/dL (ref 0.00–0.40)
Total Protein: 6.6 g/dL (ref 6.0–8.5)

## 2019-10-04 ENCOUNTER — Other Ambulatory Visit: Payer: Medicare Other

## 2019-10-06 ENCOUNTER — Ambulatory Visit (INDEPENDENT_AMBULATORY_CARE_PROVIDER_SITE_OTHER): Payer: Medicare Other | Admitting: Pharmacist

## 2019-10-06 ENCOUNTER — Encounter: Payer: Self-pay | Admitting: Pharmacist

## 2019-10-06 ENCOUNTER — Other Ambulatory Visit: Payer: Self-pay

## 2019-10-06 DIAGNOSIS — E7800 Pure hypercholesterolemia, unspecified: Secondary | ICD-10-CM | POA: Diagnosis not present

## 2019-10-06 DIAGNOSIS — I6521 Occlusion and stenosis of right carotid artery: Secondary | ICD-10-CM | POA: Diagnosis not present

## 2019-10-06 NOTE — Progress Notes (Signed)
Patient ID: Nicole Barr                 DOB: Aug 17, 1945                    MRN: [id number]     HPI: Nicole Barr is a 74 y.o. female patient referred to lipid clinic by Dr. Meda Coffee. PMH is significant for HLD and alcoholic liver cirrhosis.  Gastroenterologist was comfortable staring patient on lipid lowering therapy and Praluent was started on 08/02/19.  Patient presents today concerned about struggles with diet and weight loss.  Reports she has been trying to eat a lower fat diet and is trying to go to the gym at least 3 days a week but tries for 5.  When she goes to the gym she exercises for at least an hour.  Reports occasional joint pain and soreness, which she believes is due to exercise.    Since starting Praluent, patient had complained about occasional shortness of breath and muscle/joint pain but does not know if this is related to medication or due to her exercise routine.  Is tolerating medication.  Current Medications: Praluent 75 mg daily Risk Factors: Age, CAD, former smoker, HLD LDL goal: < 70  Diet: Vegetables, lean proteins  Exercise: Exercises at gym Monday, Wednesday, Friday and tries to go on weekends as well.  Exercises for at least 1 hour.  Social History: Reports is not drinking  Labs:  09/28/19 TC 142 TG 87 HDL 58 VLDL 16 LDL 68 05/13/19 TC 209 TG 95 HDL 68 VLDL 17 LDL 124; none 04/21/18 TC 186 TG 92 HDL 57 VLDL 18 LDL 111; none  Past Medical History:  Diagnosis Date  . Alcoholic cirrhosis ([place])   . Ascites   . Cirrhosis ([place])   . Endometriosis   . GERD (gastroesophageal reflux disease)   . Liver cyst     Current Outpatient Medications on File Prior to Visit  Medication Sig Dispense Refill  . Alirocumab (PRALUENT) 75 MG/ML SOAJ Inject 75 mg into the skin every 14 (fourteen) days. 2 pen 11  . aspirin 81 MG EC tablet Take 81 mg by mouth 2 (two) times a week.    . furosemide (LASIX) 20 MG tablet Take 20 mg by mouth 3 (three) times daily.     Marland Kitchen  lactulose (CHRONULAC) 10 GM/15ML solution 3-4 tablespoons daily by mouth    . Multiple Vitamins-Minerals (MULTIVITAMIN WITH MINERALS) tablet Take 1 tablet by mouth once a week.     . nadolol (CORGARD) 20 MG tablet Take 20 mg by mouth daily.    . polyethylene glycol powder (GLYCOLAX/MIRALAX) 17 GM/SCOOP powder Take by mouth.    . rifaximin (XIFAXAN) 550 MG TABS tablet Take 550 mg by mouth 2 (two) times daily.    Marland Kitchen spironolactone (ALDACTONE) 50 MG tablet Take 50 mg by mouth 3 (three) times daily.      No current facility-administered medications on file prior to visit.    Allergies  Allergen Reactions  . Carvedilol Other (See Comments)    Other reaction(s): Hypertension (intolerance), Rapid Heart Rate Increases BP to 300 - per pt    Assessment/Plan:  1. Hyperlipidemia - Patient LDL on 6/23 68 mg/dL which is at goal of <70.  Encouraged patient to continue Praluent 75 mg every 2 weeks.  If still having exercise related joint pain, encouraged patient to follow up with PCP.  Continue exercise at least 30 minutes a day at least 5  days a a week and positive diet changes.  Placed lab order for repeat lipid panel in 3 months.  Recheck as needed.  Karren Cobble, PharmD, BCACP, [facility] [address] [place], [place] [address] Phone: [phone]; Fax: [phone] 10/06/2019 8:53 AM

## 2019-10-06 NOTE — Patient Instructions (Signed)
It was nice seeing you today!  Your LDL goal is <70 so your cholesterol level is at goal  Continue your Praluent once every 2 weeks  Your diet sounds great.  Keep it up!  Continue exercising at the gym at least 30 minutes five days a week  Karren Cobble, PharmD, BCACP, [place] [address] [place], [place] [address] Phone: [phone]; Fax: [phone] 10/06/2019 8:28 AM

## 2019-10-27 IMAGING — US US ABDOMEN COMPLETE
1 series · 14 of 25 positions shown · non-contrast
Comparison: 08/18/2017.

CLINICAL DATA: Alcoholic cirrhosis.

EXAM:
ABDOMEN ULTRASOUND COMPLETE

[Series 1: us abdomen complete · 0.23mm/px · 14 of 101 slices shown]
[im 1/101]
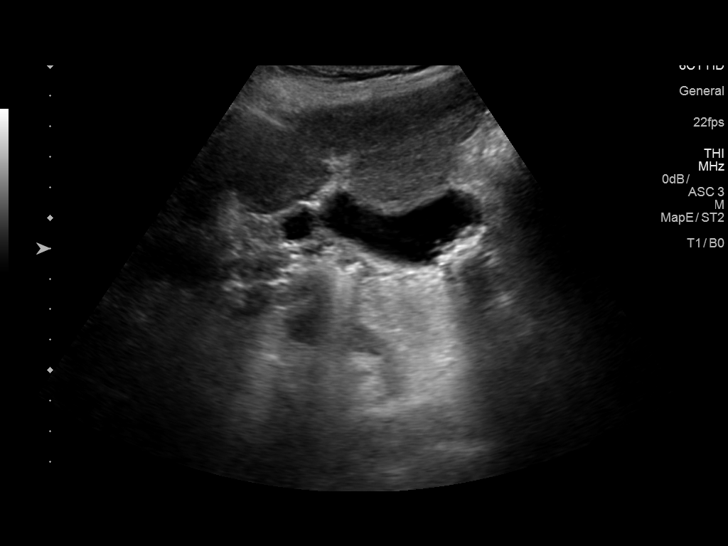
[im 9/101]
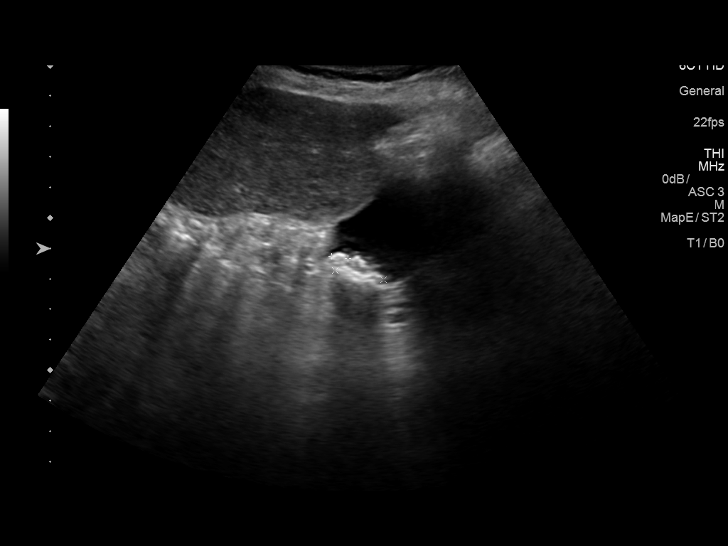
[im 17/101]
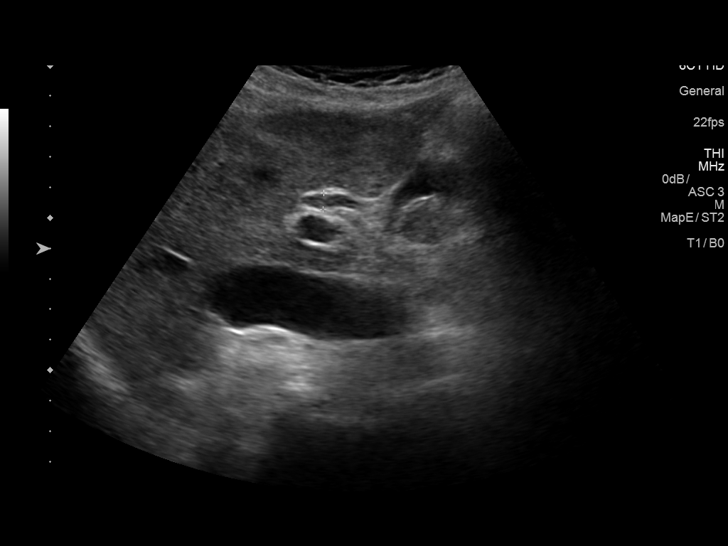
[im 26/101]
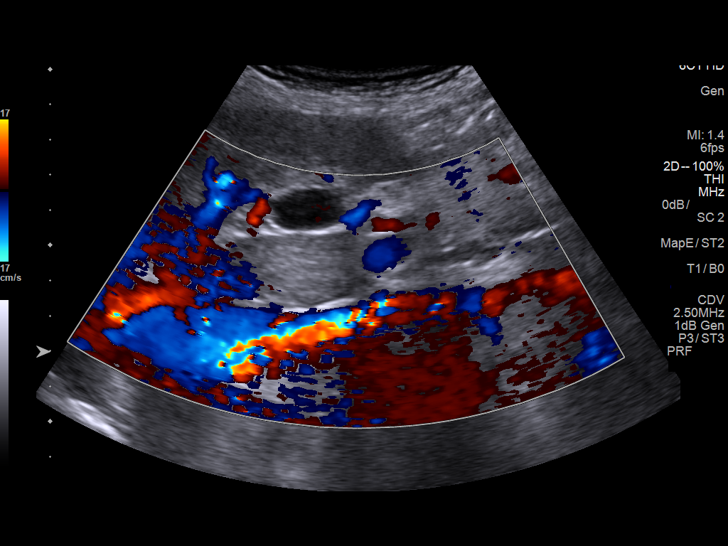
[im 34/101]
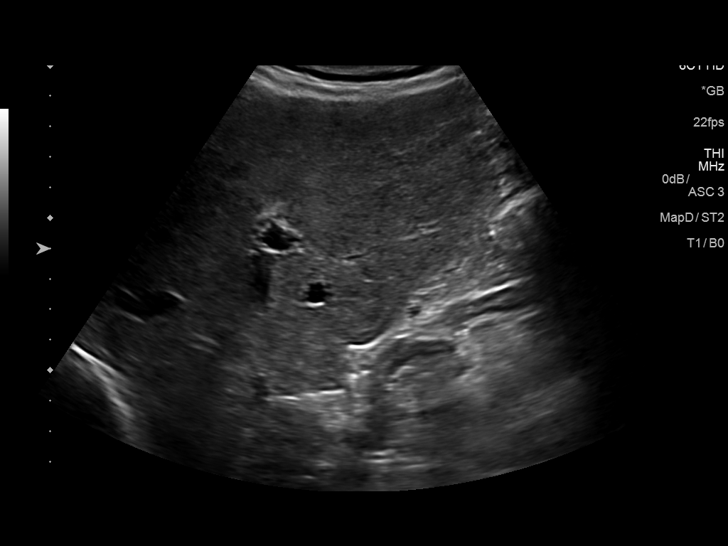
[im 38/101]
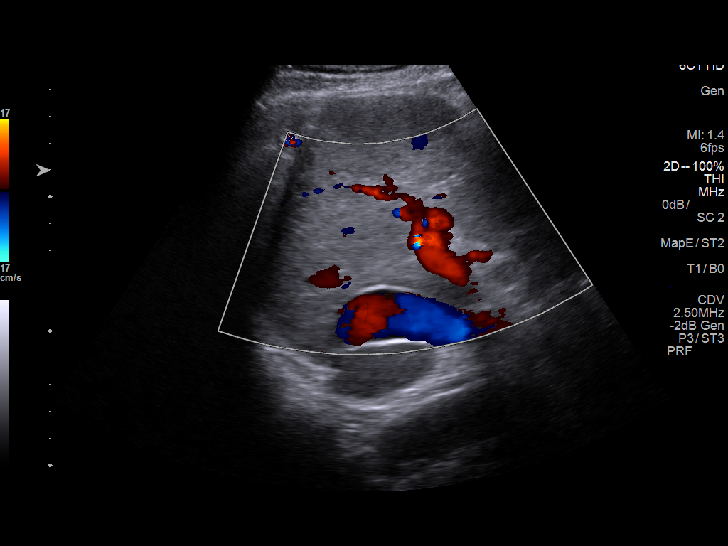
[im 46/101]
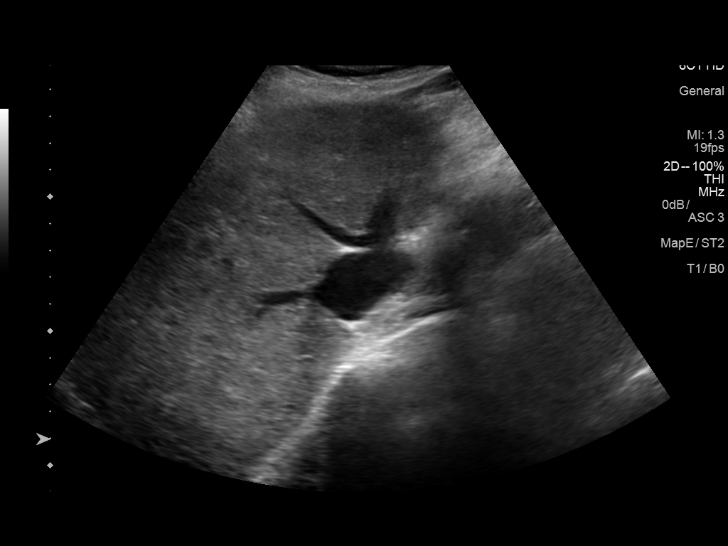
[im 55/101]
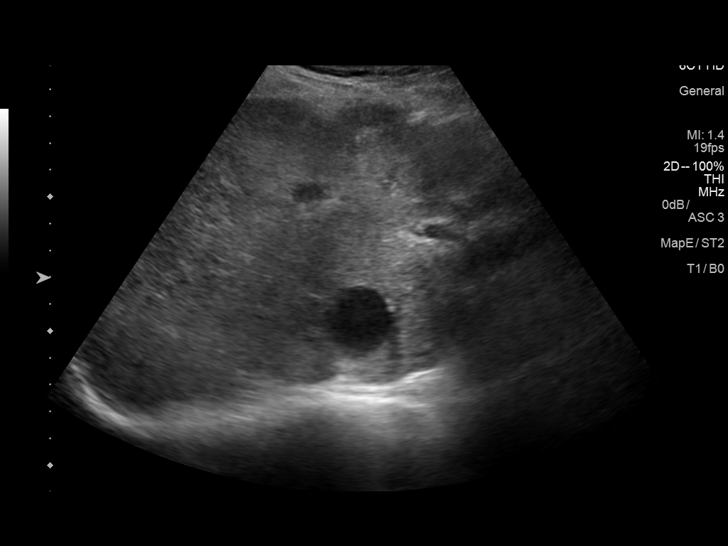
[im 63/101]
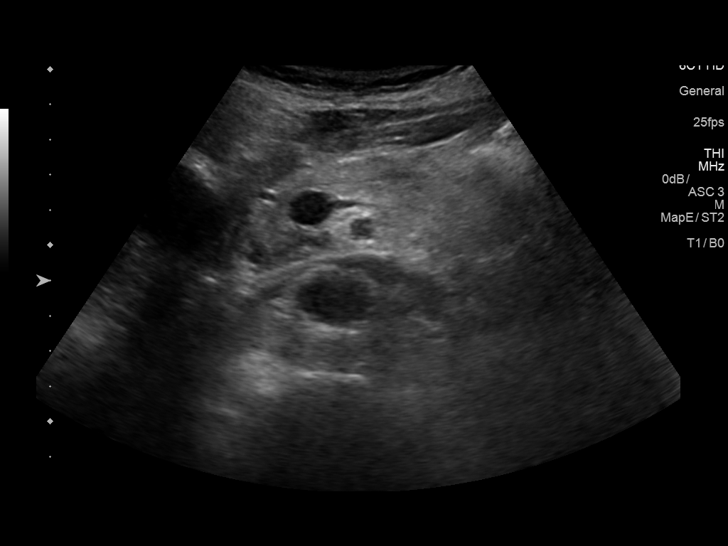
[im 67/101]
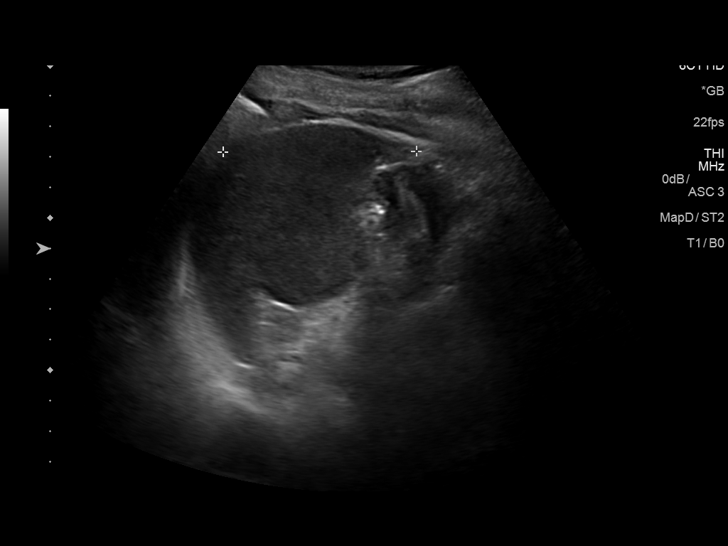
[im 76/101]
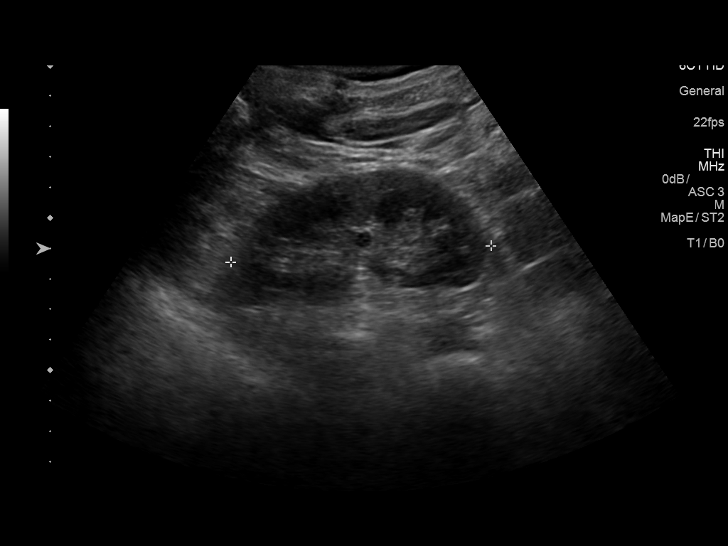
[im 84/101]
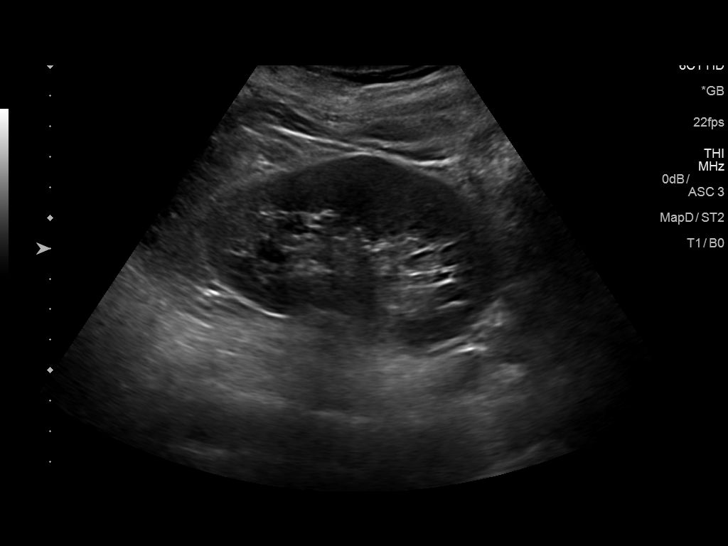
[im 92/101]
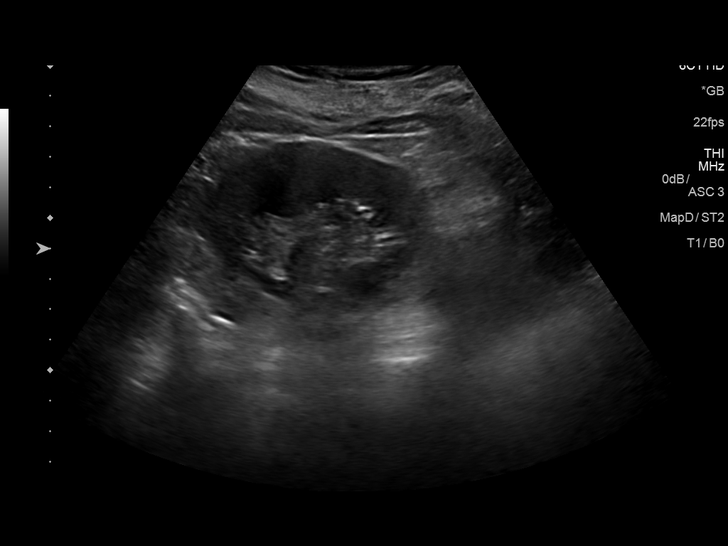
[im 101/101]
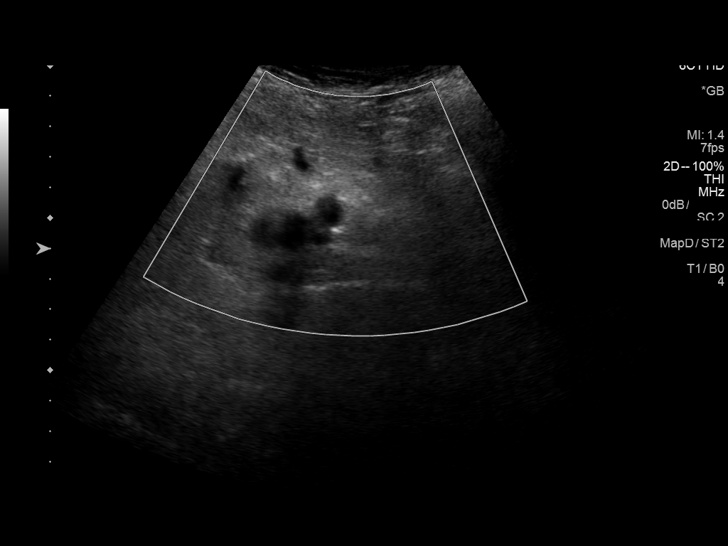

[14 of 25 positions shown; findings below may reference images not displayed]

FINDINGS: Gallbladder: Multiple small gallstones are again demonstrated in the
gallbladder, measuring up to 5 mm in maximum diameter each. No
gallbladder wall thickening or pericholecystic fluid. No sonographic
Murphy sign.

Common bile duct: Diameter: 5.6 mm

Liver: 2.3 cm cyst. Mildly lobulated contours. Mildly heterogeneous.
Portal vein is patent on color Doppler imaging with normal direction
of blood flow towards the liver.

IVC: No abnormality visualized.

Pancreas: Visualized portion unremarkable.

Spleen: Rounded contours with no mass seen.  Normal in size.

Right Kidney: Length: 9.4 cm. Echogenicity within normal limits. No
mass or hydronephrosis visualized.

Left Kidney: Length: 10.0 cm. Echogenicity within normal limits. No
mass or hydronephrosis visualized.

Abdominal aorta: No aneurysm visualized.

Other findings: No free peritoneal fluid.
IMPRESSION: 1. Stable mild changes of cirrhosis of the liver.
2. Cholelithiasis without evidence of cholecystitis.

## 2019-11-30 DIAGNOSIS — Z1211 Encounter for screening for malignant neoplasm of colon: Secondary | ICD-10-CM | POA: Diagnosis not present

## 2019-11-30 DIAGNOSIS — Z1212 Encounter for screening for malignant neoplasm of rectum: Secondary | ICD-10-CM | POA: Diagnosis not present

## 2019-12-06 ENCOUNTER — Ambulatory Visit: Payer: Medicare Other | Attending: Internal Medicine

## 2019-12-06 DIAGNOSIS — Z23 Encounter for immunization: Secondary | ICD-10-CM

## 2019-12-06 NOTE — Progress Notes (Signed)
   Covid-19 Vaccination Clinic  Name:  Nicole Barr    MRN: [id number] DOB: 06-14-1945  12/06/2019  Nicole Barr was observed post Covid-19 immunization for 15 minutes without incident. She was provided with Vaccine Information Sheet and instruction to access the V-Safe system.   Nicole Barr was instructed to call 911 with any severe reactions post vaccine: Marland Kitchen Difficulty breathing  . Swelling of face and throat  . A fast heartbeat  . A bad rash all over body  . Dizziness and weakness

## 2019-12-08 LAB — COLOGUARD: COLOGUARD: POSITIVE — AB

## 2019-12-12 DIAGNOSIS — Z23 Encounter for immunization: Secondary | ICD-10-CM | POA: Diagnosis not present

## 2019-12-30 ENCOUNTER — Other Ambulatory Visit: Payer: Self-pay | Admitting: Gastroenterology

## 2019-12-30 DIAGNOSIS — K7460 Unspecified cirrhosis of liver: Secondary | ICD-10-CM

## 2020-01-03 ENCOUNTER — Other Ambulatory Visit: Payer: Self-pay

## 2020-01-03 ENCOUNTER — Ambulatory Visit
Admission: RE | Admit: 2020-01-03 | Discharge: 2020-01-03 | Disposition: A | Discharge status: Home / Self Care | Payer: Medicare Other | Source: Ambulatory Visit | Attending: Gastroenterology | Admitting: Gastroenterology

## 2020-01-03 DIAGNOSIS — K7460 Unspecified cirrhosis of liver: Secondary | ICD-10-CM

## 2020-01-03 DIAGNOSIS — K8020 Calculus of gallbladder without cholecystitis without obstruction: Secondary | ICD-10-CM | POA: Diagnosis not present

## 2020-01-04 ENCOUNTER — Other Ambulatory Visit: Payer: Medicare Other

## 2020-01-10 DIAGNOSIS — R195 Other fecal abnormalities: Secondary | ICD-10-CM | POA: Diagnosis not present

## 2020-01-10 DIAGNOSIS — K7460 Unspecified cirrhosis of liver: Secondary | ICD-10-CM | POA: Diagnosis not present

## 2020-02-02 DIAGNOSIS — E7800 Pure hypercholesterolemia, unspecified: Secondary | ICD-10-CM | POA: Diagnosis not present

## 2020-02-02 DIAGNOSIS — Z1389 Encounter for screening for other disorder: Secondary | ICD-10-CM | POA: Diagnosis not present

## 2020-02-02 DIAGNOSIS — N183 Chronic kidney disease, stage 3 unspecified: Secondary | ICD-10-CM | POA: Diagnosis not present

## 2020-02-02 DIAGNOSIS — Z23 Encounter for immunization: Secondary | ICD-10-CM | POA: Diagnosis not present

## 2020-02-02 DIAGNOSIS — Z0000 Encounter for general adult medical examination without abnormal findings: Secondary | ICD-10-CM | POA: Diagnosis not present

## 2020-02-02 DIAGNOSIS — K7030 Alcoholic cirrhosis of liver without ascites: Secondary | ICD-10-CM | POA: Diagnosis not present

## 2020-02-24 DIAGNOSIS — Z1159 Encounter for screening for other viral diseases: Secondary | ICD-10-CM | POA: Diagnosis not present

## 2020-02-29 DIAGNOSIS — R195 Other fecal abnormalities: Secondary | ICD-10-CM | POA: Diagnosis not present

## 2020-02-29 DIAGNOSIS — K5730 Diverticulosis of large intestine without perforation or abscess without bleeding: Secondary | ICD-10-CM | POA: Diagnosis not present

## 2020-02-29 DIAGNOSIS — D128 Benign neoplasm of rectum: Secondary | ICD-10-CM | POA: Diagnosis not present

## 2020-02-29 DIAGNOSIS — K2100 Gastro-esophageal reflux disease with esophagitis, without bleeding: Secondary | ICD-10-CM | POA: Diagnosis not present

## 2020-02-29 DIAGNOSIS — K3189 Other diseases of stomach and duodenum: Secondary | ICD-10-CM | POA: Diagnosis not present

## 2020-02-29 DIAGNOSIS — I8500 Esophageal varices without bleeding: Secondary | ICD-10-CM | POA: Diagnosis not present

## 2020-02-29 DIAGNOSIS — K649 Unspecified hemorrhoids: Secondary | ICD-10-CM | POA: Diagnosis not present

## 2020-02-29 DIAGNOSIS — K766 Portal hypertension: Secondary | ICD-10-CM | POA: Diagnosis not present

## 2020-02-29 DIAGNOSIS — D122 Benign neoplasm of ascending colon: Secondary | ICD-10-CM | POA: Diagnosis not present

## 2020-03-06 DIAGNOSIS — D122 Benign neoplasm of ascending colon: Secondary | ICD-10-CM | POA: Diagnosis not present

## 2020-03-06 DIAGNOSIS — D128 Benign neoplasm of rectum: Secondary | ICD-10-CM | POA: Diagnosis not present

## 2020-03-16 DIAGNOSIS — K7031 Alcoholic cirrhosis of liver with ascites: Secondary | ICD-10-CM | POA: Diagnosis not present

## 2020-03-16 DIAGNOSIS — I8511 Secondary esophageal varices with bleeding: Secondary | ICD-10-CM | POA: Diagnosis not present

## 2020-04-23 ENCOUNTER — Telehealth: Payer: Self-pay | Admitting: Pharmacist

## 2020-04-23 NOTE — Telephone Encounter (Signed)
Pt called clinic stating her Praluent is > $400. Looks like she's in the [employer] which is active through 06/13/20, but she has used all of her funding. Advised pt we cannot re-enroll her until her current grant expires. She cannot afford her current deductible, so she will temporarily stop Praluent until we re-enroll her in [place] in March.

## 2020-05-03 ENCOUNTER — Ambulatory Visit (HOSPITAL_COMMUNITY)
Admission: RE | Admit: 2020-05-03 | Discharge: 2020-05-03 | Disposition: A | Discharge status: Home / Self Care | Payer: Medicare Other | Source: Ambulatory Visit | Attending: Cardiology | Admitting: Cardiology

## 2020-05-03 ENCOUNTER — Other Ambulatory Visit: Payer: Self-pay

## 2020-05-03 ENCOUNTER — Ambulatory Visit (HOSPITAL_COMMUNITY): Payer: Medicare Other

## 2020-05-03 DIAGNOSIS — I6523 Occlusion and stenosis of bilateral carotid arteries: Secondary | ICD-10-CM | POA: Diagnosis not present

## 2020-05-03 DIAGNOSIS — Z9889 Other specified postprocedural states: Secondary | ICD-10-CM | POA: Insufficient documentation

## 2020-05-08 DIAGNOSIS — M25551 Pain in right hip: Secondary | ICD-10-CM | POA: Diagnosis not present

## 2020-05-08 DIAGNOSIS — M792 Neuralgia and neuritis, unspecified: Secondary | ICD-10-CM | POA: Diagnosis not present

## 2020-05-08 DIAGNOSIS — N183 Chronic kidney disease, stage 3 unspecified: Secondary | ICD-10-CM | POA: Diagnosis not present

## 2020-05-22 ENCOUNTER — Telehealth: Payer: Self-pay | Admitting: Pharmacist

## 2020-05-22 NOTE — Telephone Encounter (Signed)
Pt called clinic and stated she filled out Praluent patient assistance forms. She said her forms do not say PASS or [place] at the top so I am unsure where she got them from. Advised pt she needs to spend $500 out of pocket on meds this year before PASS will cover Praluent, and that her [employer] used up all of its money and she is not eligible to re-enroll until 3/9 and we can do so at that time.  She wishes to bring by forms for [place] to look at and will drop them off later today.

## 2020-05-24 NOTE — Telephone Encounter (Signed)
Pt assistance form for Praluent has been signed and faxed.

## 2020-06-05 ENCOUNTER — Telehealth: Payer: Self-pay | Admitting: Pharmacist

## 2020-06-05 NOTE — Telephone Encounter (Signed)
Patient called and LVM about Praluent cost and about new MD since Dr. Meda Coffee is leaving [facility].  She spent $300 out of pocket today for Praluent. Per note from [place] in Jan her [employer] ran out of money. Can be renewed 3/9.   She was denied Pt assistance with drug company bc she need to spend $500 to qualify for patient assistance. Explained that this includes all Rx costs.   I will renew her [employer] grant when eligible in a few days.  Explained to pt she can pick any new Dr. She would like that are taking new pt. (only one at [address] who is not is Dr. Burt Knack). Pt to call and make apt with MD of her choice. Answered all of pt questions.

## 2020-06-07 DIAGNOSIS — M25551 Pain in right hip: Secondary | ICD-10-CM | POA: Diagnosis not present

## 2020-06-07 NOTE — Telephone Encounter (Signed)
[place] successfully renewed  Pharmacy Card Id [id number] Group [id number] PCN [id number] BIN [id number]  New ID called into to pharmacy. Called pt and left VM per DPR with the above info

## 2020-06-08 ENCOUNTER — Ambulatory Visit (INDEPENDENT_AMBULATORY_CARE_PROVIDER_SITE_OTHER): Payer: Medicare Other | Admitting: Family Medicine

## 2020-06-08 ENCOUNTER — Ambulatory Visit: Payer: Self-pay

## 2020-06-08 ENCOUNTER — Other Ambulatory Visit: Payer: Self-pay

## 2020-06-08 ENCOUNTER — Ambulatory Visit (INDEPENDENT_AMBULATORY_CARE_PROVIDER_SITE_OTHER): Payer: Medicare Other

## 2020-06-08 ENCOUNTER — Encounter: Payer: Self-pay | Admitting: Family Medicine

## 2020-06-08 VITALS — BP 120/60 | HR 61 | Ht 66.0 in | Wt 170.0 lb

## 2020-06-08 DIAGNOSIS — M5442 Lumbago with sciatica, left side: Secondary | ICD-10-CM

## 2020-06-08 DIAGNOSIS — M25551 Pain in right hip: Secondary | ICD-10-CM

## 2020-06-08 DIAGNOSIS — G8929 Other chronic pain: Secondary | ICD-10-CM

## 2020-06-08 DIAGNOSIS — E559 Vitamin D deficiency, unspecified: Secondary | ICD-10-CM

## 2020-06-08 DIAGNOSIS — M2550 Pain in unspecified joint: Secondary | ICD-10-CM

## 2020-06-08 DIAGNOSIS — M999 Biomechanical lesion, unspecified: Secondary | ICD-10-CM | POA: Diagnosis not present

## 2020-06-08 DIAGNOSIS — I6523 Occlusion and stenosis of bilateral carotid arteries: Secondary | ICD-10-CM

## 2020-06-08 DIAGNOSIS — M5441 Lumbago with sciatica, right side: Secondary | ICD-10-CM

## 2020-06-08 DIAGNOSIS — M545 Low back pain, unspecified: Secondary | ICD-10-CM | POA: Diagnosis not present

## 2020-06-08 LAB — CBC WITH DIFFERENTIAL/PLATELET
Basophils Absolute: 0 10*3/uL (ref 0.0–0.1)
Basophils Relative: 0.9 % (ref 0.0–3.0)
Eosinophils Absolute: 0.2 10*3/uL (ref 0.0–0.7)
Eosinophils Relative: 4.9 % (ref 0.0–5.0)
HCT: 38.1 % (ref 36.0–46.0)
Hemoglobin: 13.1 g/dL (ref 12.0–15.0)
Lymphocytes Relative: 33 % (ref 12.0–46.0)
Lymphs Abs: 1.2 10*3/uL (ref 0.7–4.0)
MCHC: 34.4 g/dL (ref 30.0–36.0)
MCV: 88.2 fl (ref 78.0–100.0)
Monocytes Absolute: 0.4 10*3/uL (ref 0.1–1.0)
Monocytes Relative: 10.9 % (ref 3.0–12.0)
Neutro Abs: 1.9 10*3/uL (ref 1.4–7.7)
Neutrophils Relative %: 50.3 % (ref 43.0–77.0)
Platelets: 206 10*3/uL (ref 150.0–400.0)
RBC: 4.32 Mil/uL (ref 3.87–5.11)
RDW: 13 % (ref 11.5–15.5)
WBC: 3.8 10*3/uL — ABNORMAL LOW (ref 4.0–10.5)

## 2020-06-08 LAB — COMPREHENSIVE METABOLIC PANEL
ALT: 17 U/L (ref 0–35)
AST: 22 U/L (ref 0–37)
Albumin: 4.3 g/dL (ref 3.5–5.2)
Alkaline Phosphatase: 61 U/L (ref 39–117)
BUN: 23 mg/dL (ref 6–23)
CO2: 32 mEq/L (ref 19–32)
Calcium: 10.3 mg/dL (ref 8.4–10.5)
Chloride: 100 mEq/L (ref 96–112)
Creatinine, Ser: 1.13 mg/dL (ref 0.40–1.20)
GFR: 48.33 mL/min — ABNORMAL LOW (ref >60.00)
Glucose, Bld: 91 mg/dL (ref 70–99)
Potassium: 3.7 mEq/L (ref 3.5–5.1)
Sodium: 140 mEq/L (ref 135–145)
Total Bilirubin: 0.7 mg/dL (ref 0.2–1.2)
Total Protein: 7 g/dL (ref 6.0–8.3)

## 2020-06-08 LAB — VITAMIN D 25 HYDROXY (VIT D DEFICIENCY, FRACTURES): VITD: 38.72 ng/mL (ref 30.00–100.00)

## 2020-06-08 LAB — SEDIMENTATION RATE: Sed Rate: 10 mm/hr (ref 0–30)

## 2020-06-08 NOTE — Assessment & Plan Note (Addendum)
Patient does have some low back pain.  We attempted some soft tissue and muscle energy technique with some very mild improvement.  I do believe some of the sacroiliac joint but patient does have many different comorbidities.  We will start formal physical therapy which I think will be beneficial.  Discussed with patient all recent lab work was from 2021 showed patient did have chronic kidney disease.  We will recheck at this time.  Encouraged her to follow-up with primary care provider.  No medications given to her today.  Follow-up with me again 6 to 8 weeks.  X-rays pending. Patient does have a significant number of comorbidities and will have a very low threshold for advanced imaging.  Patient does seems to be fairly active and going to the gym multiple times a week.

## 2020-06-08 NOTE — Assessment & Plan Note (Signed)
   Decision today to treat with OMT was based on Physical Exam  After verbal consent patient was treated with soft tissue, ME, techniques in  lumbar and sacral areas, all areas are chronic avoided any high velocity secondary to patient's age and somewhat hypermobility.  Patient tolerated the procedure well with improvement in symptoms  Patient given exercises, stretches and lifestyle modifications  See medications in patient instructions if given  Patient will follow up in 6-8 weeks

## 2020-06-08 NOTE — Progress Notes (Signed)
Nicole Barr [address] [place] Phone: [phone] Subjective:   I Nicole Barr am serving as a Education administrator for Dr. Hulan Saas.  This visit occurred during the SARS-CoV-2 public health emergency.  Safety protocols were in place, including screening questions prior to the visit, additional usage of staff PPE, and extensive cleaning of exam room while observing appropriate contact time as indicated for disinfecting solutions.   I'm seeing this patient by the request  of:  Gaynelle Arabian, MD  CC: Low back pain  [id number]  Nicole Barr is a 75 y.o. female coming in with complaint of right hip pain. Patient states she has had this pain before. Feels like her hips are off. Used to get OMT when she lived in [place]. Believes the elliptical caused the pain. History of ruptured disc. Numbness bilaterally in the toes. States she has purchased 3 mattresses and none of them are comfortable. Patient states that she limps at times as well.   Onset- Chronic  Location - right hip pain Duration-  Character- Aggravating factors- sitting to standing, laying down Reliving factors-  Therapies tried- new mattresses, heat Severity- 4/10 Patient does give a past medical history significant for laminectomy of the back in 1985.    Past Medical History:  Diagnosis Date  . Alcoholic cirrhosis ([place])   . Ascites   . Cirrhosis ([place])   . Endometriosis   . GERD (gastroesophageal reflux disease)   . Liver cyst    Past Surgical History:  Procedure Laterality Date  . CAROTID ENDARTERECTOMY Right 2008  . egd with banding  2016   with bleeding after wards done at [facility]  . ESOPHAGOGASTRODUODENOSCOPY (EGD) WITH PROPOFOL N/A 05/02/2015   Procedure: ESOPHAGOGASTRODUODENOSCOPY (EGD) WITH PROPOFOL;  Surgeon: Arta Silence, MD;  Location: [facility];  Service: Endoscopy;  Laterality: N/A;  . ESOPHAGOGASTRODUODENOSCOPY (EGD) WITH PROPOFOL N/A 06/06/2015    Procedure: ESOPHAGOGASTRODUODENOSCOPY (EGD) WITH PROPOFOL;  Surgeon: Arta Silence, MD;  Location: [facility];  Service: Endoscopy;  Laterality: N/A;  . GASTRIC VARICES BANDING N/A 05/02/2015   Procedure: GASTRIC VARICES BANDING;  Surgeon: Arta Silence, MD;  Location: [facility];  Service: Endoscopy;  Laterality: N/A;  . HEMORROIDECTOMY    . LAMINECTOMY  1989   lumbar 4-5  . lapaendectomy  yrs ago  . LAPAROSCOPY  1989   adhesions  . oophrectomy Left 1989  . TONSILLECTOMY     age 53   Social History   Socioeconomic History  . Marital status: Single    Spouse name: Not on file  . Number of children: Not on file  . Years of education: Not on file  . Highest education level: Not on file  Occupational History  . Not on file  Tobacco Use  . Smoking status: Former Smoker    Start date: 04/17/1968    Quit date: 04/17/1973    Years since quitting: 47.1  . Smokeless tobacco: Never Used  Substance and Sexual Activity  . Alcohol use: No    Alcohol/week: 10.0 standard drinks    Types: 10 Glasses of wine per week    Comment: currently states she is not drinking but states she usually had 2 glasses of wine per evening  . Drug use: No  . Sexual activity: Not on file  Other Topics Concern  . Not on file  Social History Narrative  . Not on file   Social Determinants of Health   Financial Resource Strain: Not on file  Food  Insecurity: Not on file  Transportation Needs: Not on file  Physical Activity: Not on file  Stress: Not on file  Social Connections: Not on file   Allergies  Allergen Reactions  . Carvedilol Other (See Comments)    Other reaction(s): Hypertension (intolerance), Rapid Heart Rate Increases BP to 300 - per pt   Family History  Problem Relation Age of Onset  . Diabetes Mother   . Cancer Mother   . Hypertension Mother   . Hyperlipidemia Mother   . Lupus Mother   . Vitiligo Mother   . Heart failure Father   . Heart failure Maternal Grandmother       Current Outpatient Medications (Cardiovascular):  Marland Kitchen  Alirocumab (PRALUENT) 75 MG/ML SOAJ, Inject 75 mg into the skin every 14 (fourteen) days. .  furosemide (LASIX) 20 MG tablet, Take 20 mg by mouth 3 (three) times daily.  .  nadolol (CORGARD) 20 MG tablet, Take 20 mg by mouth daily. Marland Kitchen  spironolactone (ALDACTONE) 50 MG tablet, Take 50 mg by mouth 3 (three) times daily.   Current Outpatient Medications (Respiratory):  .  albuterol (VENTOLIN HFA) 108 (90 Base) MCG/ACT inhaler, Inhale 2 puffs into the lungs every 6 (six) hours.  Current Outpatient Medications (Analgesics):  .  aspirin 81 MG EC tablet, Take 81 mg by mouth 2 (two) times a week.   Current Outpatient Medications (Other):  .  lactulose (CHRONULAC) 10 GM/15ML solution, 3-4 tablespoons daily by mouth .  Multiple Vitamins-Minerals (MULTIVITAMIN WITH MINERALS) tablet, Take 1 tablet by mouth once a week.  .  polyethylene glycol powder (GLYCOLAX/MIRALAX) 17 GM/SCOOP powder, Take by mouth. .  rifaximin (XIFAXAN) 550 MG TABS tablet, Take 550 mg by mouth 2 (two) times daily.   Reviewed prior external information including notes and imaging from  primary care provider As well as notes that were available from care everywhere and other healthcare systems.  Past medical history, social, surgical and family history all reviewed in electronic medical record.  No pertanent information unless stated regarding to the chief complaint.   Review of Systems:  No headache, visual changes, nausea, vomiting, diarrhea, constipation, dizziness, abdominal pain, skin rash, fevers, chills, night sweats, weight loss, swollen lymph nodes,, joint swelling, chest pain, shortness of breath, mood changes. POSITIVE muscle aches, body aches   Objective  Blood pressure 120/60, pulse 61, height 5\' 6"  (1.676 m), weight 170 lb (77.1 kg), SpO2 100 %.   General: No apparent distress alert and oriented x3 mood and affect normal, dressed appropriately.  HEENT:  Pupils equal, extraocular movements intact  Respiratory: Patient's speak in full sentences and does not appear short of breath  Cardiovascular: No lower extremity edema, non tender, no erythema  Gait very mildly antalgic Mild arthritic changes of multiple joints. MSK: Low back exam does have some mild loss of lordosis and does have some degenerative scoliosis.  Patient does have tenderness to palpation over the right sacroiliac joint.  Mild pain in the right paraspinal musculature as well.  Negative though FABER test with actually hypermobility of the hips bilaterally.  Negative straight leg test but mild increase in back tightness with extension of the back.  Neurovascularly intact distally.  Osteopathic findings L2 flexed rotated and side bent right Sacrum right on right    Impression and Recommendations:     The above documentation has been reviewed and is accurate and complete Lyndal Pulley, DO

## 2020-06-08 NOTE — Patient Instructions (Addendum)
Good to see you Lumbar and hip xrays today Labs today as well PT [place] they will call you Continue to stay active for cardio recumbent bike See me again in 6-8 weeks

## 2020-06-15 ENCOUNTER — Ambulatory Visit: Payer: Medicare Other | Attending: Family Medicine | Admitting: Physical Therapy

## 2020-06-15 ENCOUNTER — Encounter: Payer: Self-pay | Admitting: Internal Medicine

## 2020-06-15 ENCOUNTER — Encounter: Payer: Self-pay | Admitting: Physical Therapy

## 2020-06-15 ENCOUNTER — Other Ambulatory Visit: Payer: Self-pay

## 2020-06-15 ENCOUNTER — Ambulatory Visit (INDEPENDENT_AMBULATORY_CARE_PROVIDER_SITE_OTHER): Payer: Medicare Other | Admitting: Internal Medicine

## 2020-06-15 VITALS — BP 112/60 | HR 57 | Ht 66.0 in | Wt 167.0 lb

## 2020-06-15 DIAGNOSIS — I2583 Coronary atherosclerosis due to lipid rich plaque: Secondary | ICD-10-CM

## 2020-06-15 DIAGNOSIS — I2510 Atherosclerotic heart disease of native coronary artery without angina pectoris: Secondary | ICD-10-CM

## 2020-06-15 DIAGNOSIS — M25551 Pain in right hip: Secondary | ICD-10-CM

## 2020-06-15 DIAGNOSIS — R252 Cramp and spasm: Secondary | ICD-10-CM

## 2020-06-15 DIAGNOSIS — I340 Nonrheumatic mitral (valve) insufficiency: Secondary | ICD-10-CM

## 2020-06-15 DIAGNOSIS — M6281 Muscle weakness (generalized): Secondary | ICD-10-CM | POA: Insufficient documentation

## 2020-06-15 DIAGNOSIS — E7800 Pure hypercholesterolemia, unspecified: Secondary | ICD-10-CM | POA: Diagnosis not present

## 2020-06-15 DIAGNOSIS — I6523 Occlusion and stenosis of bilateral carotid arteries: Secondary | ICD-10-CM

## 2020-06-15 DIAGNOSIS — I700 Atherosclerosis of aorta: Secondary | ICD-10-CM | POA: Diagnosis not present

## 2020-06-15 NOTE — Patient Instructions (Addendum)
Medication Instructions:  Your physician recommends that you continue on your current medications as directed. Please refer to the Current Medication list given to you today.  *If you need a refill on your cardiac medications before your next appointment, please call your pharmacy*   Lab Work: NONE If you have labs (blood work) drawn today and your tests are completely normal, you will receive your results only by: Marland Kitchen MyChart Message (if you have MyChart) OR . A paper copy in the mail If you have any lab test that is abnormal or we need to change your treatment, we will call you to review the results.   Testing/Procedures: Your physician has requested that you have an echocardiogram. Echocardiography is a painless test that uses sound waves to create images of your heart. It provides your doctor with information about the size and shape of your heart and how well your heart's chambers and valves are working. This procedure takes approximately one hour. There are no restrictions for this procedure.  Your physician has requested that you have a carotid duplex in 10 months.      Follow-Up: At [facility], you and your health needs are our priority.  As part of our continuing mission to provide you with exceptional heart care, we have created designated Provider Care Teams.  These Care Teams include your primary Cardiologist (physician) and Advanced Practice Providers (APPs -  Physician Assistants and Nurse Practitioners) who all work together to provide you with the care you need, when you need it.    Your next appointment:   12 month(s)  The format for your next appointment:   In Person  Provider:   You may see Werner Lean, MD or one of the following Advanced Practice Providers on your designated Care Team:    Melina Copa, PA-C  Ermalinda Barrios, PA-C

## 2020-06-15 NOTE — Progress Notes (Signed)
Cardiology Office Note:    Date:  06/15/2020   ID:  Nicole Barr, DOB [birth year], MRN [id number]  PCP:  Gaynelle Arabian, [place]  Cardiologist:  Werner Lean, MD  Advanced Practice Provider:  No care team member to display Electrophysiologist:  None       CC: Transition Care from Dr. Meda Coffee  History of Present Illness:    Nicole Barr is a 75 y.o. female with a hx of non-obstructive CAD and Carotid disease s/p CEA; alcoholic cirrhosis on transplant list ([facility]) with varices.  Through Dr. Francesca Oman [facility] [phone]) found to have moderate pLAD disease; no statin with cirrhosis.  Had minimal CAS plaque; was advised for one year follow up 04/2019.  Aortic atherosclerosis.  Patient notes that she is doing .  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  She has since been alcohol free and is feeling ok.  Lately has had concerns at finding out she has CKD stage III.  She notes that she been drinking plenty of water and taken her diuretics.  Has not had a paracenesis in some time and has had none of her DOE or weight gain symptoms from back then.  Notes that Dr. Marisue Humble, Drs. Outlaw ([facility]) and Dr. Edison Pace ([facility]) comprise her medical team.  Recently saw Dr. Charlann Boxer and is getting assistance for some hip pain.  No chest pain or pressure.  No SOB/DOE and no PND/Orthopnea.  No weight gain or leg swelling.  Notes that she has never felt 100% herself after [facility].  Past Medical History:  Diagnosis Date  . Alcoholic cirrhosis ([place])   . Ascites   . Cirrhosis ([place])   . Endometriosis   . GERD (gastroesophageal reflux disease)   . Liver cyst     Past Surgical History:  Procedure Laterality Date  . CAROTID ENDARTERECTOMY Right 2008  . egd with banding  2016   with bleeding after wards done at [facility]  . ESOPHAGOGASTRODUODENOSCOPY (EGD) WITH PROPOFOL N/A 05/02/2015   Procedure:  ESOPHAGOGASTRODUODENOSCOPY (EGD) WITH PROPOFOL;  Surgeon: Arta Silence, MD;  Location: [facility]COPY;  Service: Endoscopy;  Laterality: N/A;  . ESOPHAGOGASTRODUODENOSCOPY (EGD) WITH PROPOFOL N/A 06/06/2015   Procedure: ESOPHAGOGASTRODUODENOSCOPY (EGD) WITH PROPOFOL;  Surgeon: Arta Silence, MD;  Location: [facility] END[facility]COPY;  Service: Endoscopy;  Laterality: N/A;  . GASTRIC VARICES BANDING N/A 05/02/2015   Procedure: GASTRIC VARICES BANDING;  Surgeon: Arta Silence, MD;  Location: [facility] END[facility]COPY;  Service: Endoscopy;  Laterality: N/A;  . HEMORROIDECTOMY    . LAMINECTOMY  1989   lumbar 4-5  . lapaendectomy  yrs ago  . LAPAROSCOPY  1989   adhesions  . oophrectomy Left 1989  . TONSILLECTOMY     age 48    Current Medications: Current Meds  Medication Sig  . albuterol (VENTOLIN HFA) 108 (90 Base) MCG/ACT inhaler Inhale 2 puffs into the lungs every 6 (six) hours.  . Alirocumab (PRALUENT) 75 MG/ML SOAJ Inject 75 mg into the skin every 14 (fourteen) days.  Marland Kitchen aspirin 81 MG EC tablet Take 81 mg by mouth 2 (two) times a week.  . furosemide (LASIX) 20 MG tablet Take 20 mg by mouth 2 (two) times daily.  Marland Kitchen lactulose (CHRONULAC) 10 GM/15ML solution 3-4 tablespoons daily by mouth  . Multiple Vitamins-Minerals (MULTIVITAMIN WITH MINERALS) tablet Take 1 tablet by mouth once a week.   . nadolol (CORGARD) 20 MG tablet Take  10 mg by mouth daily.  . polyethylene glycol powder (GLYCOLAX/MIRALAX) 17 GM/SCOOP powder Take by mouth.  . rifaximin (XIFAXAN) 550 MG TABS tablet Take 550 mg by mouth 2 (two) times daily.  Marland Kitchen spironolactone (ALDACTONE) 50 MG tablet Take 50 mg by mouth 2 (two) times daily.     Allergies:   Carvedilol   Social History   Socioeconomic History  . Marital status: Single    Spouse name: Not on file  . Number of children: Not on file  . Years of education: Not on file  . Highest education level: Not on file  Occupational History  . Not on file  Tobacco Use  . Smoking status: Former Smoker     Start date: 04/17/1968    Quit date: 04/17/1973    Years since quitting: 47.1  . Smokeless tobacco: Never Used  Substance and Sexual Activity  . Alcohol use: No    Alcohol/week: 10.0 standard drinks    Types: 10 Glasses of wine per week    Comment: currently states she is not drinking but states she usually had 2 glasses of wine per evening  . Drug use: No  . Sexual activity: Not on file  Other Topics Concern  . Not on file  Social History Narrative  . Not on file   Social Determinants of Health   Financial Resource Strain: Not on file  Food Insecurity: Not on file  Transportation Needs: Not on file  Physical Activity: Not on file  Stress: Not on file  Social Connections: Not on file    Social:  Former alcohol; complicated divorce, former Surveyor, [employer] at [employer]  Family History: The patient's family history includes Cancer in her mother; Diabetes in her mother; Heart failure in her father and maternal grandmother; Hyperlipidemia in her mother; Hypertension in her mother; Lupus in her mother; Vitiligo in her mother.  ROS:   Please see the history of present illness.     All other systems reviewed and are negative.  EKGs/Labs/Other Studies Reviewed:    The following studies were reviewed today:  EKG:   06/15/20: Sinus Bradycardia rate 57 WNL 05/13/19: SR rate 66 WNL   Carotid Artery Duplex: Date: 05/03/2020 Results: Summary:  Right Carotid: The carotid endarterectomy site was well visualize  demonstrating         normal patency with no evidence of significant diameter         reduction.   Left Carotid: Velocities in the left ICA are consistent with a 1-39%  stenosis.        Non-hemodynamically significant plaque <50% noted in the  CCA.        Essentially stable LICA velocities.   Transthoracic Echocardiogram: Date:07/26/2014 Results: Reportedly- unable to access showed normal EF, normal diastolic function, trivial TR, and trivial  pericardial effusion.  CardiacCT : Date: 10/25/2014 Results: IMPRESSION: 1. Coronary calcium score of 335. This was 66 percentile for age and sex matched control.  2. Normal coronary origin.  There is right and left co-dominance.  3. There is moderate non-obstructive CAD in the proximal LAD. Diffuse calcified plaque.  An aggressive risk factor modification is recommended.    Recent Labs: 06/08/2020: ALT 17; BUN 23; Creatinine, Ser 1.13; Hemoglobin 13.1; Platelets 206.0; Potassium 3.7; Sodium 140  Recent Lipid Panel    Component Value Date/Time   CHOL 142 09/28/2019 0806   TRIG 87 09/28/2019 0806   HDL 58 09/28/2019 0806   CHOLHDL 2.4 09/28/2019 0806   LDLCALC 68  09/28/2019 0806    Risk Assessment/Calculations:     N/A  Physical Exam:    VS:  BP 112/60 (BP Location: Left Arm, Patient Position: Sitting, Cuff Size: Normal)   Pulse (!) 57   Ht 5\' 6"  (1.676 m)   Wt 167 lb (75.8 kg)   SpO2 97%   BMI 26.95 kg/m     Wt Readings from Last 3 Encounters:  06/15/20 167 lb (75.8 kg)  06/08/20 170 lb (77.1 kg)  05/13/19 167 lb 3.2 oz (75.8 kg)   GEN:  Well nourished, well developed in no acute distress HEENT: Normal NECK: No JVD; No carotid bruits LYMPHATICS: No lymphadenopathy CARDIAC: RRR, no murmurs, rubs, gallops RESPIRATORY:  Clear to auscultation without rales, wheezing or rhonchi  ABDOMEN: Soft, non-tender, non-distended, no fluid wave MUSCULOSKELETAL:  No edema; No deformity  SKIN: Warm and dry NEUROLOGIC:  Alert and oriented x 3 PSYCHIATRIC:  Normal affect   ASSESSMENT:    1. Nonrheumatic mitral valve regurgitation   2. Coronary artery disease due to lipid rich plaque   3. Aortic atherosclerosis ([place])   4. Hypercholesterolemia without hypertriglyceridemia   5. Carotid stenosis, asymptomatic, bilateral    PLAN:    In order of problems listed above:  Coronary Artery Disease; Nonobstructive Aortic Atherosclerosis HLD - asymptomatic  - anatomy: pLAD  disease - continue ASA 81 mg; has had no bleeding of her esophageal varices on this medication - continue PCSKI9i, goal LDL < 70; we took some paperwork from the [employer] and will work in combination of lipid clinic to help with this medication - continue BB  Carotid Artery Disease; Nonobstructive - asymptomatic - continue ASA 81 mg as above - continue PCSK9i, goal LDL < 70 - last assessment 04/2020; 10 month repeat study - discussed procedural indications  Time Spent Directly with Patient:   I have spent a total of 40 miuntes with the patient reviewing  notes, imaging, and examining the patient as well as establishing an assessment and plan that was discussed personally with the patient.  > 50% of time was spent in direct patient care and answered questions about CAD, CKD, CAS, MVP.  Reviewed paperwork with pharmacy: Roxanne Gates is improved.  Patient will need to keep the pharmacy card with her.  She has assistance until 06/2021 presently.  Medication Adjustments/Labs and Tests Ordered: Current medicines are reviewed at length with the patient today.  Concerns regarding medicines are outlined above.  Orders Placed This Encounter  Procedures  . US Carotid Duplex Bilateral  . EKG 12-Lead  . ECHOCARDIOGRAM COMPLETE   No orders of the defined types were placed in this encounter.   Patient Instructions  Medication Instructions:  Your physician recommends that you continue on your current medications as directed. Please refer to the Current Medication list given to you today.  *If you need a refill on your cardiac medications before your next appointment, please call your pharmacy*   Lab Work: NONE If you have labs (blood work) drawn today and your tests are completely normal, you will receive your results only by: Marland Kitchen MyChart Message (if you have MyChart) OR . A paper copy in the mail If you have any lab test that is abnormal or we need to change your treatment, we  will call you to review the results.   Testing/Procedures: Your physician has requested that you have an echocardiogram. Echocardiography is a painless test that uses sound waves to create images of your heart. It provides your doctor  with information about the size and shape of your heart and how well your heart's chambers and valves are working. This procedure takes approximately one hour. There are no restrictions for this procedure.  Your physician has requested that you have a carotid duplex in 10 months.      Follow-Up: At [facility], you and your health needs are our priority.  As part of our continuing mission to provide you with exceptional heart care, we have created designated Provider Care Teams.  These Care Teams include your primary Cardiologist (physician) and Advanced Practice Providers (APPs -  Physician Assistants and Nurse Practitioners) who all work together to provide you with the care you need, when you need it.    Your next appointment:   12 month(s)  The format for your next appointment:   In Person  Provider:   You may see Werner Lean, MD or one of the following Advanced Practice Providers on your designated Care Team:    Melina Copa, PA-C  Ermalinda Barrios, PA-C        Signed, Werner Lean, MD  06/15/2020 10:09 AM    [place]

## 2020-06-15 NOTE — Therapy (Signed)
[facility] [address] [place], [place], [address] Phone: [phone]   Fax:  [phone]  Physical Therapy Evaluation  Patient Details  Name: Nicole Barr MRN: [id number] Date of Birth: 1945-12-28 Referring Provider (PT): Lyndal Pulley, DO   Encounter Date: 06/15/2020   PT End of Session - 06/15/20 1215    Visit Number 1    Date for PT Re-Evaluation 09/07/20    Authorization Type Medicare/AARP    Progress Note Due on Visit 10    PT Start Time 1100    PT Stop Time 1155    PT Time Calculation (min) 55 min    Activity Tolerance Patient tolerated treatment well    Behavior During Therapy [facility] for tasks assessed/performed           Past Medical History:  Diagnosis Date  . Alcoholic cirrhosis ([place])   . Ascites   . Cirrhosis ([place])   . Endometriosis   . GERD (gastroesophageal reflux disease)   . Liver cyst     Past Surgical History:  Procedure Laterality Date  . CAROTID ENDARTERECTOMY Right 2008  . egd with banding  2016   with bleeding after wards done at [facility]  . ESOPHAGOGASTRODUODENOSCOPY (EGD) WITH PROPOFOL N/A 05/02/2015   Procedure: ESOPHAGOGASTRODUODENOSCOPY (EGD) WITH PROPOFOL;  Surgeon: Arta Silence, MD;  Location: [facility];  Service: Endoscopy;  Laterality: N/A;  . ESOPHAGOGASTRODUODENOSCOPY (EGD) WITH PROPOFOL N/A 06/06/2015   Procedure: ESOPHAGOGASTRODUODENOSCOPY (EGD) WITH PROPOFOL;  Surgeon: Arta Silence, MD;  Location: [facility];  Service: Endoscopy;  Laterality: N/A;  . GASTRIC VARICES BANDING N/A 05/02/2015   Procedure: GASTRIC VARICES BANDING;  Surgeon: Arta Silence, MD;  Location: [facility];  Service: Endoscopy;  Laterality: N/A;  . HEMORROIDECTOMY    . LAMINECTOMY  1989   lumbar 4-5  . lapaendectomy  yrs ago  . LAPAROSCOPY  1989   adhesions  . oophrectomy Left 1989  . TONSILLECTOMY     age 75    There were no vitals filed for this visit.    Subjective  Assessment - 06/15/20 1100    Subjective Pt referred to OPPT for Rt hip pain.  Pt reports episodes of Rt hip alignment problems every 10 years.  Pt goes to gym several days a week and 4 weeks ago tried elliptical and flared up Rt hip.  Pt is very in AM on waking and then is active throughout the day despite ongoing pain.    Pertinent History Vit D deficiency, polyarthralgia, has stone bruise in Lt foot, lumbar laminectomy 1989 L4/5    How long can you sit comfortably? 10 min    How long can you stand comfortably? 2 hours    How long can you walk comfortably? 2 hours    Diagnostic tests xray Rt hip negative, xray lumbar chronic compression fx T11, degenerative changes mostly L1/2    Patient Stated Goals make sure hip is in alignment, keep from having to have a hip replacement    Currently in Pain? Yes    Pain Score 4    can get down to a 2/10 when loosens up   Pain Location Hip    Pain Orientation Right;Lateral    Pain Descriptors / Indicators Other (Comment)   rubbing, raw, no lubrication   Pain Type Chronic pain;Acute pain    Pain Radiating Towards local only    Pain Onset More than a month ago    Pain Frequency Intermittent    Aggravating  Factors  on waking in AM, sitting x 10 min, transitions from bed, chair, car to upright    Pain Relieving Factors movement, changing positions              [facility] PT Assessment - 06/15/20 0001      Assessment   Medical Diagnosis M25.551 (ICD-10-CM) - Right hip pain    Referring Provider (PT) Lyndal Pulley, DO    Next MD Visit unsure of date    Prior Therapy no      Precautions   Precautions None      Restrictions   Weight Bearing Restrictions No      Balance Screen   Has the patient fallen in the past 6 months No      [place] residence    Living Arrangements Alone    Type of [place] Access Level entry    Home Layout One level    [place] None      Prior Function   Level  of Social Circle Retired    Leisure goes to gym      Cognition   Overall Cognitive Status Within Functional Limits for tasks assessed      Observation/Other Assessments   Focus on Therapeutic Outcomes (FOTO)  60%, goal 70%      Functional Tests   Functional tests Squat;Sit to Stand;Single leg stance      Squat   Comments Rt SI unlocks      Single Leg Stance   Comments Rt SI unlocks, hip instability on Rt>Lt, poor balance bil      Sit to Stand   Comments ind without use of UEs      Posture/Postural Control   Posture/Postural Control Postural limitations    Postural Limitations Decreased lumbar lordosis      ROM / Strength   AROM / PROM / Strength AROM;Strength;PROM      AROM   Overall AROM Comments trunk ROM limited 30% all planes, flexes with SB Lt>RT      PROM   Overall PROM Comments bil hips WNL for all motions but extension, hypermobile IR/ER bil    PROM Assessment Site Hip    Right/Left Hip Right    Right Hip Extension 20   cannot get to neutral/0 deg with knee bent secondary to quad length restriction     Strength   Overall Strength Comments bil knees and ankles 5/5, Lt hip 4+/5 throughout    Strength Assessment Site Hip    Right/Left Hip Right    Right Hip Flexion 4-/5    Right Hip Extension 4-/5    Right Hip External Rotation  3+/5    Right Hip Internal Rotation 4/5    Right Hip ABduction 3+/5      Flexibility   Soft Tissue Assessment /Muscle Length yes    Quadriceps limited 50% on Rt, 30% on Lt    Quadratus Lumborum limited bil 25%      Palpation   SI assessment  Rt SI joint compressed with poor glide, Lt WNL, atrophy of glut max on Rt    Palpation comment Rt SI joint line, Rt quads, Rt glut med      Special Tests    Special Tests Hip Special Tests    Hip Special Tests  Saralyn Pilar (FABER) Test;Thomas Test;SI Distraction;SI Compression      Saralyn Pilar (FABER) Test   Findings Negative  Side Right      SI Compression   Findings  Positive    Comments for pain on Rt      SI Distraction   Findings  Positive    Comments for relief on Rt      Ambulation/Gait   Gait Comments poor closed chain hip stability Rt>Lt                      Objective measurements completed on examination: See above findings.       [facility] PT Treatment/Exercise - 06/15/20 0001      Self-Care   Self-Care Other Self-Care Comments    Other Self-Care Comments  self-correction for SI alignment with MET, gave typed instructions      Exercises   Exercises Knee/Hip      Knee/Hip Exercises: Stretches   Quad Stretch Right;1 rep;30 seconds    Quad Stretch Limitations tried SL and standing, gave both options for HEP      Manual Therapy   Manual Therapy Manual Traction;Muscle Energy Technique    Manual Traction Rt long axis Gr V, hip neutral in supine x 3, + clunk Rt innominant with improved Rt SI joint glide and pain relief    Muscle Energy Technique supine hooklying 5x5" holds for Rt hip ext and Lt hip flexion, pubic shotgun and hip abd 3x3" finishing                    PT Short Term Goals - 06/15/20 1230      PT SHORT TERM GOAL #1   Title Pt will be ind with initial HEP    Time 3    Period Weeks    Status New    Target Date 07/06/20      PT SHORT TERM GOAL #2   Title Pt will report at least 30% less Rt hip pain with daily activities between PT visits secondary to improved pelvic alignment.    Time 4    Period Weeks    Status New    Target Date 07/13/20      PT SHORT TERM GOAL #3   Title Pt will achieve Rt quad and QL length WNL    Time 4    Period Weeks    Status New    Target Date 07/13/20             PT Long Term Goals - 06/15/20 1233      PT LONG TERM GOAL #1   Title Pt will demo improved Rt SI joint and hip stability with stance phase of gait, squat and step ups to 6" riser without unlocking Rt SI joint.    Time 12    Period Weeks    Status New    Target Date 09/07/20      PT  LONG TERM GOAL #2   Title Pt will report improved Rt hip pain with daily activities by at least 70%    Time 12    Period Weeks    Status New    Target Date 09/07/20      PT LONG TERM GOAL #3   Title Pt will achieve improved Rt hip strength of at least 4+/5 for hip flexion, abd, ext and ER to improve ability to maintain alignment of Rt hemipelvis for dynamic tasks.    Time 12    Period Weeks    Status New    Target Date 09/07/20  PT LONG TERM GOAL #4   Title Pt will improve FOTO score from 60% to 70% to demo improved function of Rt hip.    Baseline 60%    Time 12    Period Weeks    Status New    Target Date 09/07/20      PT LONG TERM GOAL #5   Title Pt will be ind with advanced HEP and understand how to safely progress.    Time 12    Period Weeks    Status New    Target Date 09/07/20                  Plan - 06/15/20 1216    Clinical Impression Statement Pt is a pleasant 75yo female with chronic history of Rt hip pain for years.  She has episodes of pain where something feels out of socket with the most recent episode occuring approx 4 weeks ago after using the ellipitcal.  She is very active in her committment to going to the gym M-F x 2 hours.  She has history of lumbar laminectomy in 1980s and past need for adjustments to back/pelvis for alignment.  Pt presents with limited trunk ROM in all planes without pain.  She has some excessive mobility in bil hips with IR/ER but is limited in hip ext bil due to signif quad length restriction.  Rt SI joint dysfunction is present with unlocking on Rt with squat and SLS.  She has poor hip/pelvic stabilization in closed chain on Rt and is unable to balance in SLS.  Weakness is present in Rt hip flexors, abd, ext and ER with atrophy corresponding over Rt SI joint of glut max, glut med and deep multifidi on Rt.  Rt innominate is anteriorly rotated and upsliped with signif tension in Rt quads and QL.  PT performed MET and long axis  manipulation through Rt LE today with improved alignment, glide and pain end of session.  PT educated Pt on goals of needing to address soft tissue tension contributing to mal-alignment and strength needs along postero-lateral hip and deep core to maintain alignment.  PT gave quad stretch and self-MET instructions to begin HEP.  Pt will benefit from skilled PT to address pain and dysfunction to improve quality of life and maximize function.    Personal Factors and Comorbidities Age;Time since onset of injury/illness/exacerbation;Comorbidity 1    Comorbidities Hx of lumbar laminectomy    Examination-Activity Limitations Transfers;Squat;Stairs;Sit;Bend    Examination-Participation Restrictions Community Activity;Shop;Cleaning;Laundry    Stability/Clinical Decision Making Stable/Uncomplicated    Clinical Decision Making Low    Rehab Potential Good    PT Frequency 2x / week    PT Duration 12 weeks    PT Treatment/Interventions ADLs/Self Care Home Management;Electrical Stimulation;Cryotherapy;Moist Heat;Spinal Manipulations;Joint Manipulations;Stair training;Functional mobility training;Therapeutic activities;Therapeutic exercise;Balance training;Neuromuscular re-education;Manual techniques;Patient/family education;Dry needling;Taping    PT Next Visit Plan recheck Rt SI alignment/glide, manual techniques for alignment as needed, STM and stretching Rt quad and Rt QL, add supine clam, bridge, SL clam for Rt hip, sit to stand band around knees to HEP    PT Home Exercise Plan Access Code: [email or web address]    Consulted and Agree with Plan of Care Patient           Patient will benefit from skilled therapeutic intervention in order to improve the following deficits and impairments:  Decreased range of motion,Postural dysfunction,Decreased strength,Decreased mobility,Improper body mechanics,Impaired flexibility,Decreased balance,Pain,Increased muscle spasms  Visit Diagnosis: Pain in right hip - Plan: PT  plan of  care cert/re-cert  Muscle weakness (generalized) - Plan: PT plan of care cert/re-cert  Cramp and spasm - Plan: PT plan of care cert/re-cert     Problem List Patient Active Problem List   Diagnosis Date Noted  . Nonrheumatic mitral valve regurgitation 06/15/2020  . Aortic atherosclerosis ([place]) 06/15/2020  . Nonallopathic lesion of sacral region 06/08/2020  . Low back pain 01/26/2017  . Peripheral neuropathy 07/18/2016  . Loss of transverse plantar arch 07/18/2016  . Medication dosing interval changed 09/12/2015  . Increased abdominal girth 04/04/2015  . Coronary artery disease due to lipid rich plaque 04/04/2015  . Lung nodule 11/07/2014  . Altered blood in stool 09/27/2014  . ALC (alcoholic liver cirrhosis) ([place]) 07/26/2014  . Endometriosis 07/26/2014  . Bleeding esophageal varices ([place]) 07/26/2014  . Acute blood loss anemia 03/23/2014  . Below normal amount of sodium in the blood 03/23/2014  . Drug-induced hypokalemia 02/17/2014  . Alcohol abuse, in remission 02/08/2014  . Biliary cirrhosis ([place]) 02/08/2014  . Abdominal dropsy 02/02/2014  . H/O cardiovascular disorder 02/02/2014  . Breath shortness 02/02/2014  . Cirrhosis ([place]) 11/30/2013  . Clinical depression 07/23/2004  . Chronic nonalcoholic liver disease 80/09/3492  . Hypercholesterolemia without hypertriglyceridemia 07/23/2004    Darria Corvera E Seddrick Flax 06/15/2020, 12:38 PM  [facility] [address] [place], [place], [address] Phone: [phone]   Fax:  [phone]  Name: SHIRLY BARTOSIEWICZ MRN: [id number] Date of Birth: Oct 14, 1945

## 2020-06-15 NOTE — Patient Instructions (Addendum)
Lay on your back with both knees bent.  Keep Rt foot on floor, march Lt knee up and place flat hand on Lt thigh.  With equal and opposite pressure, march Lt knee into hand while pressing hand down into Lt thigh (no motion occurs) and at the same time, press Rt foot into floor as though trying to push a hole into the floor (activate buttocks on Rt), hold 5 sec as hard as you can, then relax and repeat 5 rounds.  Do this 2-3 times daily.   Access Code: [email or web address] URL: [email or web address] Date: 06/15/2020 Prepared by: Nicole Barr  Exercises Standing Quadriceps Stretch - 2 x daily - 7 x weekly - 3 sets - 3 reps - 30 hold Sidelying Quad Stretch - 1 x daily - 7 x weekly - 1 sets - 3 reps - 30 hold

## 2020-06-20 ENCOUNTER — Encounter: Payer: Self-pay | Admitting: Physical Therapy

## 2020-06-20 ENCOUNTER — Ambulatory Visit: Payer: Medicare Other | Admitting: Physical Therapy

## 2020-06-20 ENCOUNTER — Other Ambulatory Visit: Payer: Self-pay

## 2020-06-20 DIAGNOSIS — R252 Cramp and spasm: Secondary | ICD-10-CM | POA: Diagnosis not present

## 2020-06-20 DIAGNOSIS — M6281 Muscle weakness (generalized): Secondary | ICD-10-CM

## 2020-06-20 DIAGNOSIS — M25551 Pain in right hip: Secondary | ICD-10-CM | POA: Diagnosis not present

## 2020-06-20 NOTE — Patient Instructions (Addendum)
Trigger Point Dry Needling  . What is Trigger Point Dry Needling (DN)? o DN is a physical therapy technique used to treat muscle pain and dysfunction. Specifically, DN helps deactivate muscle trigger points (muscle knots).  o A thin filiform needle is used to penetrate the skin and stimulate the underlying trigger point. The goal is for a local twitch response (LTR) to occur and for the trigger point to relax. No medication of any kind is injected during the procedure.   . What Does Trigger Point Dry Needling Feel Like?  o The procedure feels different for each individual patient. Some patients report that they do not actually feel the needle enter the skin and overall the process is not painful. Very mild bleeding may occur. However, many patients feel a deep cramping in the muscle in which the needle was inserted. This is the local twitch response.   Marland Kitchen How Will I feel after the treatment? o Soreness is normal, and the onset of soreness may not occur for a few hours. Typically this soreness does not last longer than two days.  o Bruising is uncommon, however; ice can be used to decrease any possible bruising.  o In rare cases feeling tired or nauseous after the treatment is normal. In addition, your symptoms may get worse before they get better, this period will typically not last longer than 24 hours.   . What Can I do After My Treatment? o Increase your hydration by drinking more water for the next 24 hours. o You may place ice or heat on the areas treated that have become sore, however, do not use heat on inflamed or bruised areas. Heat often brings more relief post needling. o You can continue your regular activities, but vigorous activity is not recommended initially after the treatment for 24 hours. o DN is best combined with other physical therapy such as strengthening, stretching, and other therapies.    Exercises . Clamshell with Resistance - 1 x daily - 7 x weekly - 2 sets - 10  reps . Hooklying [place] with Resistance - 1 x daily - 7 x weekly - 2 sets - 10 reps . Bridge with Hip Abduction and Resistance - Ground Touches - 1 x daily - 7 x weekly - 2 sets - 10 reps . Prone Quadriceps Stretch with Strap - 1 x daily - 7 x weekly - 1 sets - 2 reps - 20s hold . Child's Pose with Sidebending - 1 x daily - 7 x weekly - 1 sets - 5 reps - 10s hold

## 2020-06-20 NOTE — Therapy (Signed)
[facility] [address], [place], [place], [address] Phone: [phone]   Fax:  [phone]  Physical Therapy Treatment  Patient Details  Name: Nicole Barr MRN: [id number] Date of Birth: [birth year] Referring Provider (PT): Lyndal Pulley, DO   Encounter Date: 06/20/2020   PT End of Session - 06/20/20 0859    Visit Number 2    Date for PT Re-Evaluation 09/07/20    Authorization Type Medicare/AARP    Progress Note Due on Visit 10    PT Start Time 0801    PT Stop Time 0852    PT Time Calculation (min) 51 min    Activity Tolerance Patient tolerated treatment well    Behavior During Therapy [facility] for tasks assessed/performed           Past Medical History:  Diagnosis Date  . Alcoholic cirrhosis ([place])   . Ascites   . Cirrhosis ([place])   . Endometriosis   . GERD (gastroesophageal reflux disease)   . Liver cyst     Past Surgical History:  Procedure Laterality Date  . CAROTID ENDARTERECTOMY Right 2008  . egd with banding  2016   with bleeding [facility]  . ESOPHAGOGASTRODUODENOSCOPY (EGD) WITH PROPOFOL N/A 05/02/2015   Procedure: ESOPHAGOGASTRODUODENOSCOPY (EGD) WITH PROPOFOL;  Surgeon: Arta Silence, MD;  Location: [facility];  Service: Endoscopy;  Laterality: N/A;  . ESOPHAGOGASTRODUODENOSCOPY (EGD) WITH PROPOFOL N/A 06/06/2015   Procedure: ESOPHAGOGASTRODUODENOSCOPY (EGD) WITH PROPOFOL;  Surgeon: Arta Silence, MD;  Location: [facility];  Service: Endoscopy;  Laterality: N/A;  . GASTRIC VARICES BANDING N/A 05/02/2015   Procedure: GASTRIC VARICES BANDING;  Surgeon: Arta Silence, MD;  Location: [facility];  Service: Endoscopy;  Laterality: N/A;  . HEMORROIDECTOMY    . LAMINECTOMY  1989   lumbar 4-5  . lapaendectomy  yrs ago  . LAPAROSCOPY  1989   adhesions  . oophrectomy Left 1989  . TONSILLECTOMY     age 75    There were no vitals filed for this visit.   Subjective  Assessment - 06/20/20 0803    Subjective "Well you know I'm stiff in the morning"    Pertinent History Vit D deficiency, polyarthralgia, has stone bruise in Lt foot, lumbar laminectomy 1989 L4/5    How long can you sit comfortably? 10 min    How long can you stand comfortably? 2 hours    How long can you walk comfortably? 2 hours    Diagnostic tests xray Rt hip negative, xray lumbar chronic compression fx T11, degenerative changes mostly L1/2    Patient Stated Goals make sure hip is in alignment, keep from having to have a hip replacement    Currently in Pain? Yes    Pain Score --   Patient unable to quantify   Pain Location Hip    Pain Orientation Right    Pain Descriptors / Indicators Squeezing    Pain Type Chronic pain    Pain Radiating Towards local only    Pain Onset More than a month ago    Pain Frequency Intermittent    Multiple Pain Sites No                             [facility] Adult PT Treatment/Exercise - 06/20/20 0001      Knee/Hip Exercises: Aerobic   Nustep Lvl 5 x 7 min      Knee/Hip Exercises: Standing  SLS x30s hold bil LE without UE; hand hold present for patient assist   SBA for safety     Knee/Hip Exercises: Seated   Sit to Sand 2 sets;10 reps;without UE support   yellow loop at knees     Knee/Hip Exercises: Supine   Bridges Strengthening;Both;2 sets;10 reps    Bridges Limitations yellow loop at knees    Other Supine Knee/Hip Exercises clamshell 2x10; yellow loop   cuing for eccentric control     Knee/Hip Exercises: Sidelying   Clams 2x10 yellow loop   cuing for stable pelvis and eccentric control     Manual Therapy   Manual Therapy Joint mobilization;Muscle Energy Technique    Manual Traction Rt hip lateral and inferior distraction mob    Muscle Energy Technique supine hooklying 5x5" holds for Rt hip ext and Lt hip flexion for anteriorly rotated innomiate                  PT Education - 06/20/20 0859    Education Details  Access Code: [email or web address]; dry needling hand out    Person(s) Educated Patient    Methods Explanation;Demonstration;Tactile cues;Verbal cues;Handout    Comprehension Verbalized understanding;Returned demonstration;Verbal cues required;Tactile cues required            PT Short Term Goals - 06/15/20 1230      PT SHORT TERM GOAL #1   Title Pt will be ind with initial HEP    Time 3    Period Weeks    Status New    Target Date 07/06/20      PT SHORT TERM GOAL #2   Title Pt will report at least 30% less Rt hip pain with daily activities between PT visits secondary to improved pelvic alignment.    Time 4    Period Weeks    Status New    Target Date 07/13/20      PT SHORT TERM GOAL #3   Title Pt will achieve Rt quad and QL length WNL    Time 4    Period Weeks    Status New    Target Date 07/13/20             PT Long Term Goals - 06/15/20 1233      PT LONG TERM GOAL #1   Title Pt will demo improved Rt SI joint and hip stability with stance phase of gait, squat and step ups to 6" riser without unlocking Rt SI joint.    Time 12    Period Weeks    Status New    Target Date 09/07/20      PT LONG TERM GOAL #2   Title Pt will report improved Rt hip pain with daily activities by at least 70%    Time 12    Period Weeks    Status New    Target Date 09/07/20      PT LONG TERM GOAL #3   Title Pt will achieve improved Rt hip strength of at least 4+/5 for hip flexion, abd, ext and ER to improve ability to maintain alignment of Rt hemipelvis for dynamic tasks.    Time 12    Period Weeks    Status New    Target Date 09/07/20      PT LONG TERM GOAL #4   Title Pt will improve FOTO score from 60% to 70% to demo improved function of Rt hip.    Baseline 60%    Time 12  Period Weeks    Status New    Target Date 09/07/20      PT LONG TERM GOAL #5   Title Pt will be ind with advanced HEP and understand how to safely progress.    Time 12    Period Weeks    Status New    Target  Date 09/07/20                 Plan - 06/20/20 0900    Clinical Impression Statement Patient ambulating from waiting room to treatment area with decreased Rt LE stance time and antalgic gait pattern. Noting increased difficulty performing sit to stand from waiting room chair. Continues to be active in the gym but avoids elliptical. Therapist noting anteriorly rotated R innominate. Noting improved rotation following muscle energy technique. Patient reporting decreased pain when performing bridging exercise with band at knees. Patient requiring verbal and tactile cuing for eccentric control of movement when performing supine and SL bridge indicating continued glute strength impairments. Reporting improved activity tolerance during sit to stand activity following manual techniques and supine exercise. Patient exhibits continued pelvic instability with SLS but demonstrates significant improvement with manual facilitation of core and glute engagement. Would benefit from continued skilled intervention to address impairments for decreased pain and improved functional mobility.    Personal Factors and Comorbidities Age;Time since onset of injury/illness/exacerbation;Comorbidity 1    Comorbidities Hx of lumbar laminectomy    Examination-Activity Limitations Transfers;Squat;Stairs;Sit;Bend    Examination-Participation Restrictions Community Activity;Shop;Cleaning;Laundry    Rehab Potential Good    PT Frequency 2x / week    PT Duration 12 weeks    PT Treatment/Interventions ADLs/Self Care Home Management;Electrical Stimulation;Cryotherapy;Moist Heat;Spinal Manipulations;Joint Manipulations;Stair training;Functional mobility training;Therapeutic activities;Therapeutic exercise;Balance training;Neuromuscular re-education;Manual techniques;Patient/family education;Dry needling;Taping    PT Next Visit Plan continue manual techniques for pelvic alignment as needed; review HEP and alter if needed; introduce DN;  continue glute strengthening and pelvic/hip stability training    PT Home Exercise Plan Access Code: [email or web address]    Consulted and Agree with Plan of Care Patient           Patient will benefit from skilled therapeutic intervention in order to improve the following deficits and impairments:  Decreased range of motion,Postural dysfunction,Decreased strength,Decreased mobility,Improper body mechanics,Impaired flexibility,Decreased balance,Pain,Increased muscle spasms  Visit Diagnosis: Pain in right hip  Muscle weakness (generalized)  Cramp and spasm     Problem List Patient Active Problem List   Diagnosis Date Noted  . Nonrheumatic mitral valve regurgitation 06/15/2020  . Aortic atherosclerosis ([place]) 06/15/2020  . Nonallopathic lesion of sacral region 06/08/2020  . Low back pain 01/26/2017  . Peripheral neuropathy 07/18/2016  . Loss of transverse plantar arch 07/18/2016  . Medication dosing interval changed 09/12/2015  . Increased abdominal girth 04/04/2015  . Coronary artery disease due to lipid rich plaque 04/04/2015  . Lung nodule 11/07/2014  . Altered blood in stool 09/27/2014  . ALC (alcoholic liver cirrhosis) ([place]) 07/26/2014  . Endometriosis 07/26/2014  . Bleeding esophageal varices ([place]) 07/26/2014  . Acute blood loss anemia 03/23/2014  . Below normal amount of sodium in the blood 03/23/2014  . Drug-induced hypokalemia 02/17/2014  . Alcohol abuse, in remission 02/08/2014  . Biliary cirrhosis ([place]) 02/08/2014  . Abdominal dropsy 02/02/2014  . H/O cardiovascular disorder 02/02/2014  . Breath shortness 02/02/2014  . Cirrhosis ([place]) 11/30/2013  . Clinical depression 07/23/2004  . Chronic nonalcoholic liver disease 62/26/3335  . Hypercholesterolemia without hypertriglyceridemia 07/23/2004    Jaime Dome S Elgie Maziarz 06/20/2020,  9:08 AM  [facility] [address], [place], [place], [address] Phone: [phone]    Fax:  [phone]  Name: Nicole Barr MRN: [id number] Date of Birth: January 07, 1946

## 2020-06-25 ENCOUNTER — Other Ambulatory Visit: Payer: Self-pay | Admitting: Cardiology

## 2020-06-25 DIAGNOSIS — I2583 Coronary atherosclerosis due to lipid rich plaque: Secondary | ICD-10-CM

## 2020-06-25 DIAGNOSIS — I2510 Atherosclerotic heart disease of native coronary artery without angina pectoris: Secondary | ICD-10-CM

## 2020-06-25 NOTE — Addendum Note (Signed)
Addended by: Precious Gilding on: 06/25/2020 02:55 PM   Modules accepted: Orders

## 2020-06-26 ENCOUNTER — Encounter (HOSPITAL_COMMUNITY): Payer: Medicare Other

## 2020-06-27 ENCOUNTER — Other Ambulatory Visit: Payer: Self-pay

## 2020-06-27 ENCOUNTER — Ambulatory Visit: Payer: Medicare Other | Admitting: Physical Therapy

## 2020-06-27 DIAGNOSIS — R252 Cramp and spasm: Secondary | ICD-10-CM | POA: Diagnosis not present

## 2020-06-27 DIAGNOSIS — M25551 Pain in right hip: Secondary | ICD-10-CM

## 2020-06-27 DIAGNOSIS — M6281 Muscle weakness (generalized): Secondary | ICD-10-CM | POA: Diagnosis not present

## 2020-06-27 NOTE — Therapy (Signed)
[facility] [address] [place], [place], [address] Phone: [phone]   Fax:  [phone]  Physical Therapy Treatment  Patient Details  Name: Nicole Barr MRN: [id number] Date of Birth: 1945/11/08 Referring Provider (PT): Lyndal Pulley, DO   Encounter Date: 06/27/2020   PT End of Session - 06/27/20 0935    Visit Number 3    Date for PT Re-Evaluation 09/07/20    Authorization Type Medicare/AARP    Progress Note Due on Visit 10    PT Start Time 0851    PT Stop Time 0937    PT Time Calculation (min) 46 min    Activity Tolerance Patient tolerated treatment well    Behavior During Therapy [facility] for tasks assessed/performed           Past Medical History:  Diagnosis Date  . Alcoholic cirrhosis ([place])   . Ascites   . Cirrhosis ([place])   . Endometriosis   . GERD (gastroesophageal reflux disease)   . Liver cyst     Past Surgical History:  Procedure Laterality Date  . CAROTID ENDARTERECTOMY Right 2008  . egd with banding  2016   with bleeding after wards done [facility]  . ESOPHAGOGASTRODUODENOSCOPY (EGD) WITH PROPOFOL N/A 05/02/2015   Procedure: ESOPHAGOGASTRODUODENOSCOPY (EGD) WITH PROPOFOL;  Surgeon: Arta Silence, MD;  [facility];  Service: Endoscopy;  Laterality: N/A;  . ESOPHAGOGASTRODUODENOSCOPY (EGD) WITH PROPOFOL N/A 06/06/2015   Procedure: ESOPHAGOGASTRODUODENOSCOPY (EGD) WITH PROPOFOL;  Surgeon: Arta Silence, MD;  [facility];  Service: Endoscopy;  Laterality: N/A;  . GASTRIC VARICES BANDING N/A 05/02/2015   Procedure: GASTRIC VARICES BANDING;  Surgeon: Arta Silence, MD;  [facility];  Service: Endoscopy;  Laterality: N/A;  . HEMORROIDECTOMY    . LAMINECTOMY  1989   lumbar 4-5  . lapaendectomy  yrs ago  . LAPAROSCOPY  1989   adhesions  . oophrectomy Left 1989  . TONSILLECTOMY     age 75    There were no vitals filed for this visit.   Subjective  Assessment - 06/27/20 0933    Subjective Patient reports soreness today. Notices improvement with bed mobility as she is able to transition supine to sit without increased pain. Continues to feel that something is "rubbing" or "not the right shape" in her hip.    Pertinent History Vit D deficiency, polyarthralgia, has stone bruise in Lt foot, lumbar laminectomy 1989 L4/5    How long can you sit comfortably? 10 min    How long can you stand comfortably? 2 hours    How long can you walk comfortably? 2 hours    Diagnostic tests xray Rt hip negative, xray lumbar chronic compression fx T11, degenerative changes mostly L1/2    Patient Stated Goals make sure hip is in alignment, keep from having to have a hip replacement    Currently in Pain? Yes    Pain Score --   Patient unable to quantify   Pain Location Hip    Pain Orientation Right    Pain Type Chronic pain    Pain Onset More than a month ago    Multiple Pain Sites No                             [facility] Adult PT Treatment/Exercise - 06/27/20 0001      Knee/Hip Exercises: Aerobic   Nustep lvl 5 x 6 min  Knee/Hip Exercises: Machines for Strengthening   Cybex Leg Press 2x10 repetitions; 90#   cuing for eccentric control     Knee/Hip Exercises: Standing   Lateral Step Up Both;2 sets;Hand Hold: 1;Step Height: 6"    Lateral Step Up Limitations 2x8 repetition      Knee/Hip Exercises: Seated   Sit to Sand 2 sets;10 reps;without UE support   yellow loop at knees     Knee/Hip Exercises: Supine   Bridges Strengthening;Both;2 sets;10 reps    Bridges Limitations yellow loop at knees    Other Supine Knee/Hip Exercises clamshell 2x10; yellow loop   cuing for pacing speed of movements     Knee/Hip Exercises: Sidelying   Clams 2x10 yellow loop      Manual Therapy   Manual Therapy Joint mobilization;Muscle Energy Technique    Joint Mobilization passive double knee to chest, thigh thrust SI mob Rt LE,    Muscle Energy  Technique supine hooklying 5x5s hold abd/add isometrics                    PT Short Term Goals - 06/15/20 1230      PT SHORT TERM GOAL #1   Title Pt will be ind with initial HEP    Time 3    Period Weeks    Status New    Target Date 07/06/20      PT SHORT TERM GOAL #2   Title Pt will report at least 30% less Rt hip pain with daily activities between PT visits secondary to improved pelvic alignment.    Time 4    Period Weeks    Status New    Target Date 07/13/20      PT SHORT TERM GOAL #3   Title Pt will achieve Rt quad and QL length WNL    Time 4    Period Weeks    Status New    Target Date 07/13/20             PT Long Term Goals - 06/15/20 1233      PT LONG TERM GOAL #1   Title Pt will demo improved Rt SI joint and hip stability with stance phase of gait, squat and step ups to 6" riser without unlocking Rt SI joint.    Time 12    Period Weeks    Status New    Target Date 09/07/20      PT LONG TERM GOAL #2   Title Pt will report improved Rt hip pain with daily activities by at least 70%    Time 12    Period Weeks    Status New    Target Date 09/07/20      PT LONG TERM GOAL #3   Title Pt will achieve improved Rt hip strength of at least 4+/5 for hip flexion, abd, ext and ER to improve ability to maintain alignment of Rt hemipelvis for dynamic tasks.    Time 12    Period Weeks    Status New    Target Date 09/07/20      PT LONG TERM GOAL #4   Title Pt will improve FOTO score from 60% to 70% to demo improved function of Rt hip.    Baseline 60%    Time 12    Period Weeks    Status New    Target Date 09/07/20      PT LONG TERM GOAL #5   Title Pt will be ind with advanced HEP  and understand how to safely progress.    Time 12    Period Weeks    Status New    Target Date 09/07/20                 Plan - 06/27/20 1610    Clinical Impression Statement Patient reports soreness and stiffness but unable to quantify any pain this date.  Demonstrates improved mechanics when performing sit to stand from treatment table at lowest setting. Continues to display impaired eccentric adduction control when performing clamshell exercise in supine and sidelying. Patient also demonstrates impaired ability to transition into full knee extension bilaterally during step up activity indicating need for continued quad and glute strengthening. Would benefit from continued skilled intervention to address impairments for improved functional activity tolerance.    Personal Factors and Comorbidities Age;Time since onset of injury/illness/exacerbation;Comorbidity 1    Comorbidities Hx of lumbar laminectomy    Examination-Activity Limitations Transfers;Squat;Stairs;Sit;Bend    Examination-Participation Restrictions Community Activity;Shop;Cleaning;Laundry    Rehab Potential Good    PT Frequency 2x / week    PT Duration 12 weeks    PT Treatment/Interventions ADLs/Self Care Home Management;Electrical Stimulation;Cryotherapy;Moist Heat;Spinal Manipulations;Joint Manipulations;Stair training;Functional mobility training;Therapeutic activities;Therapeutic exercise;Balance training;Neuromuscular re-education;Manual techniques;Patient/family education;Dry needling;Taping    PT Next Visit Plan continue manual techniques for pelvic alignment as needed; progress glute strengthening and pelvic/hip stability training to patient tolerance    PT Home Exercise Plan Access Code: [email or web address]    Consulted and Agree with Plan of Care Patient           Patient will benefit from skilled therapeutic intervention in order to improve the following deficits and impairments:  Decreased range of motion,Postural dysfunction,Decreased strength,Decreased mobility,Improper body mechanics,Impaired flexibility,Decreased balance,Pain,Increased muscle spasms  Visit Diagnosis: Pain in right hip  Muscle weakness (generalized)  Cramp and spasm     Problem List Patient Active Problem  List   Diagnosis Date Noted  . Nonrheumatic mitral valve regurgitation 06/15/2020  . Aortic atherosclerosis ([place]) 06/15/2020  . Nonallopathic lesion of sacral region 06/08/2020  . Low back pain 01/26/2017  . Peripheral neuropathy 07/18/2016  . Loss of transverse plantar arch 07/18/2016  . Medication dosing interval changed 09/12/2015  . Increased abdominal girth 04/04/2015  . Coronary artery disease due to lipid rich plaque 04/04/2015  . Lung nodule 11/07/2014  . Altered blood in stool 09/27/2014  . ALC (alcoholic liver cirrhosis) ([place]) 07/26/2014  . Endometriosis 07/26/2014  . Bleeding esophageal varices ([place]) 07/26/2014  . Acute blood loss anemia 03/23/2014  . Below normal amount of sodium in the blood 03/23/2014  . Drug-induced hypokalemia 02/17/2014  . Alcohol abuse, in remission 02/08/2014  . Biliary cirrhosis ([place]) 02/08/2014  . Abdominal dropsy 02/02/2014  . H/O cardiovascular disorder 02/02/2014  . Breath shortness 02/02/2014  . Cirrhosis ([place]) 11/30/2013  . Clinical depression 07/23/2004  . Chronic nonalcoholic liver disease 96/07/5407  . Hypercholesterolemia without hypertriglyceridemia 07/23/2004   Everardo All PT, DPT  06/27/20 9:42 AM   [facility] [address] [place], [place], [address] Phone: [phone]   Fax:  [phone]  Name: Nicole Barr MRN: [id number] Date of Birth: [birth year]

## 2020-07-04 ENCOUNTER — Ambulatory Visit: Payer: Medicare Other | Admitting: Physical Therapy

## 2020-07-04 ENCOUNTER — Other Ambulatory Visit: Payer: Self-pay

## 2020-07-04 ENCOUNTER — Encounter: Payer: Self-pay | Admitting: Physical Therapy

## 2020-07-04 DIAGNOSIS — M25551 Pain in right hip: Secondary | ICD-10-CM | POA: Diagnosis not present

## 2020-07-04 DIAGNOSIS — M6281 Muscle weakness (generalized): Secondary | ICD-10-CM | POA: Diagnosis not present

## 2020-07-04 DIAGNOSIS — R252 Cramp and spasm: Secondary | ICD-10-CM

## 2020-07-04 NOTE — Therapy (Signed)
[facility] [address] [place], [place], [address] Phone: [phone]   Fax:  [phone]  Physical Therapy Treatment  Patient Details  Name: Nicole Barr MRN: [id number] Date of Birth: 08/04/1945 Referring Provider (PT): Lyndal Pulley, DO   Encounter Date: 07/04/2020   PT End of Session - 07/04/20 0846    Visit Number 4    Date for PT Re-Evaluation 09/07/20    Authorization Type Medicare/AARP    Progress Note Due on Visit 10    PT Start Time 0840    PT Stop Time 0922    PT Time Calculation (min) 42 min           Past Medical History:  Diagnosis Date  . Alcoholic cirrhosis ([place])   . Ascites   . Cirrhosis ([place])   . Endometriosis   . GERD (gastroesophageal reflux disease)   . Liver cyst     Past Surgical History:  Procedure Laterality Date  . CAROTID ENDARTERECTOMY Right 2008  . egd with banding  2016   with bleeding [facility]  . ESOPHAGOGASTRODUODENOSCOPY (EGD) WITH PROPOFOL N/A 05/02/2015   Procedure: ESOPHAGOGASTRODUODENOSCOPY (EGD) WITH PROPOFOL;  Surgeon: Arta Silence, MD;  Location: [facility];  Service: Endoscopy;  Laterality: N/A;  . ESOPHAGOGASTRODUODENOSCOPY (EGD) WITH PROPOFOL N/A 06/06/2015   Procedure: ESOPHAGOGASTRODUODENOSCOPY (EGD) WITH PROPOFOL;  Surgeon: Arta Silence, MD;  Location: [facility];  Service: Endoscopy;  Laterality: N/A;  . GASTRIC VARICES BANDING N/A 05/02/2015   Procedure: GASTRIC VARICES BANDING;  Surgeon: Arta Silence, MD;  Location: [facility];  Service: Endoscopy;  Laterality: N/A;  . HEMORROIDECTOMY    . LAMINECTOMY  1989   lumbar 4-5  . lapaendectomy  yrs ago  . LAPAROSCOPY  1989   adhesions  . oophrectomy Left 1989  . TONSILLECTOMY     age 75    There were no vitals filed for this visit.   Subjective Assessment - 07/04/20 0842    Subjective Patient reports that she did not have confidence that she would get better with  physical therapy but is happy to see improvement.    Pertinent History Vit D deficiency, polyarthralgia, has stone bruise in Lt foot, lumbar laminectomy 1989 L4/5    How long can you sit comfortably? 10 min    How long can you stand comfortably? 2 hours    How long can you walk comfortably? 2 hours    Diagnostic tests xray Rt hip negative, xray lumbar chronic compression fx T11, degenerative changes mostly L1/2    Patient Stated Goals make sure hip is in alignment, keep from having to have a hip replacement    Currently in Pain? Yes    Pain Score 2     Pain Location Hip    Pain Orientation Right    Pain Type Chronic pain    Pain Onset More than a month ago    Pain Frequency Intermittent                             [facility] Adult PT Treatment/Exercise - 07/04/20 0001      Balance   Balance Assessed Yes      Static Standing Balance   Tandem Stance - Right Leg --   modified tandem x30s; x30s on black foam   Tandem Stance - Left Leg --   modified tandem x30s; x30s on black foam     Knee/Hip  Exercises: Aerobic   Nustep lvl 5 x 8 min      Knee/Hip Exercises: Machines for Strengthening   Cybex Leg Press 2x10 repetitions; 80#; seat 4      Knee/Hip Exercises: Standing   Lateral Step Up Both;2 sets;Hand Hold: 1;Step Height: 6"    Lateral Step Up Limitations 2x10    SLS x4 trials bil LE; Rt: 7s, Lt 5s    Other Standing Knee Exercises side step; yellow loop at calves      Knee/Hip Exercises: Seated   Sit to Sand --   2x8; red loop at knees     Knee/Hip Exercises: Supine   Bridges Strengthening;Both;2 sets;10 reps    Bridges Limitations red loop at knees      Knee/Hip Exercises: Sidelying   Clams 2x10; red loop; bil LE      Manual Therapy   Manual Therapy Joint mobilization;Muscle Energy Technique    Joint Mobilization passive double knee to chest, thigh thrust SI mob Rt LE,    Muscle Energy Technique supine hooklying 5x5s hold abd/add isometrics                     PT Short Term Goals - 06/15/20 1230      PT SHORT TERM GOAL #1   Title Pt will be ind with initial HEP    Time 3    Period Weeks    Status New    Target Date 07/06/20      PT SHORT TERM GOAL #2   Title Pt will report at least 30% less Rt hip pain with daily activities between PT visits secondary to improved pelvic alignment.    Time 4    Period Weeks    Status New    Target Date 07/13/20      PT SHORT TERM GOAL #3   Title Pt will achieve Rt quad and QL length WNL    Time 4    Period Weeks    Status New    Target Date 07/13/20             PT Long Term Goals - 06/15/20 1233      PT LONG TERM GOAL #1   Title Pt will demo improved Rt SI joint and hip stability with stance phase of gait, squat and step ups to 6" riser without unlocking Rt SI joint.    Time 12    Period Weeks    Status New    Target Date 09/07/20      PT LONG TERM GOAL #2   Title Pt will report improved Rt hip pain with daily activities by at least 70%    Time 12    Period Weeks    Status New    Target Date 09/07/20      PT LONG TERM GOAL #3   Title Pt will achieve improved Rt hip strength of at least 4+/5 for hip flexion, abd, ext and ER to improve ability to maintain alignment of Rt hemipelvis for dynamic tasks.    Time 12    Period Weeks    Status New    Target Date 09/07/20      PT LONG TERM GOAL #4   Title Pt will improve FOTO score from 60% to 70% to demo improved function of Rt hip.    Baseline 60%    Time 12    Period Weeks    Status New    Target Date 09/07/20  PT LONG TERM GOAL #5   Title Pt will be ind with advanced HEP and understand how to safely progress.    Time 12    Period Weeks    Status New    Target Date 09/07/20                 Plan - 07/04/20 3664    Clinical Impression Statement Patient reports that she feels that she is improving. States pain is decreasing and rates today's pain 2/10. Demonstrates improved eccentric control with  clamshell exercise even with increased resistance this date. Requiring verbal cues for total knee extension during lateral step up activity. Requiring CGA when performing SLS exericse. Able to maintain SLS x7s indicating increased fall risk. Would benefit from continued skilled intervention to address impairments for improved mobility and activity tolerance.    Personal Factors and Comorbidities Age;Time since onset of injury/illness/exacerbation;Comorbidity 1    Comorbidities Hx of lumbar laminectomy    Examination-Activity Limitations Transfers;Squat;Stairs;Sit;Bend    Examination-Participation Restrictions Community Activity;Shop;Cleaning;Laundry    Rehab Potential Good    PT Frequency 2x / week    PT Duration 12 weeks    PT Treatment/Interventions ADLs/Self Care Home Management;Electrical Stimulation;Cryotherapy;Moist Heat;Spinal Manipulations;Joint Manipulations;Stair training;Functional mobility training;Therapeutic activities;Therapeutic exercise;Balance training;Neuromuscular re-education;Manual techniques;Patient/family education;Dry needling;Taping    PT Next Visit Plan continue manual techniques as needed; progress functional strength and balance to patient tolerance    PT Home Exercise Plan Access Code: [email or web address]    Consulted and Agree with Plan of Care Patient           Patient will benefit from skilled therapeutic intervention in order to improve the following deficits and impairments:  Decreased range of motion,Postural dysfunction,Decreased strength,Decreased mobility,Improper body mechanics,Impaired flexibility,Decreased balance,Pain,Increased muscle spasms  Visit Diagnosis: Pain in right hip  Muscle weakness (generalized)  Cramp and spasm     Problem List Patient Active Problem List   Diagnosis Date Noted  . Nonrheumatic mitral valve regurgitation 06/15/2020  . Aortic atherosclerosis ([place]) 06/15/2020  . Nonallopathic lesion of sacral region 06/08/2020  . Low back  pain 01/26/2017  . Peripheral neuropathy 07/18/2016  . Loss of transverse plantar arch 07/18/2016  . Medication dosing interval changed 09/12/2015  . Increased abdominal girth 04/04/2015  . Coronary artery disease due to lipid rich plaque 04/04/2015  . Lung nodule 11/07/2014  . Altered blood in stool 09/27/2014  . ALC (alcoholic liver cirrhosis) ([place]) 07/26/2014  . Endometriosis 07/26/2014  . Bleeding esophageal varices ([place]) 07/26/2014  . Acute blood loss anemia 03/23/2014  . Below normal amount of sodium in the blood 03/23/2014  . Drug-induced hypokalemia 02/17/2014  . Alcohol abuse, in remission 02/08/2014  . Biliary cirrhosis ([place]) 02/08/2014  . Abdominal dropsy 02/02/2014  . H/O cardiovascular disorder 02/02/2014  . Breath shortness 02/02/2014  . Cirrhosis ([place]) 11/30/2013  . Clinical depression 07/23/2004  . Chronic nonalcoholic liver disease 40/34/7425  . Hypercholesterolemia without hypertriglyceridemia 07/23/2004   Everardo All PT, DPT  07/04/20 9:29 AM    [facility] [address], [place], [place], [address] Phone: [phone]   Fax:  [phone]  Name: Nicole Barr MRN: [id number] Date of Birth: 1945-06-14

## 2020-07-05 ENCOUNTER — Ambulatory Visit (HOSPITAL_COMMUNITY): Payer: Medicare Other | Attending: Cardiovascular Disease

## 2020-07-05 DIAGNOSIS — I340 Nonrheumatic mitral (valve) insufficiency: Secondary | ICD-10-CM

## 2020-07-05 LAB — ECHOCARDIOGRAM COMPLETE
Area-P 1/2: 3.24 cm2
S' Lateral: 3 cm

## 2020-07-06 ENCOUNTER — Other Ambulatory Visit: Payer: Self-pay

## 2020-07-06 ENCOUNTER — Encounter: Payer: Self-pay | Admitting: Physical Therapy

## 2020-07-06 ENCOUNTER — Ambulatory Visit: Payer: Medicare Other | Attending: Family Medicine | Admitting: Physical Therapy

## 2020-07-06 DIAGNOSIS — R252 Cramp and spasm: Secondary | ICD-10-CM | POA: Insufficient documentation

## 2020-07-06 DIAGNOSIS — M25551 Pain in right hip: Secondary | ICD-10-CM | POA: Insufficient documentation

## 2020-07-06 DIAGNOSIS — M6281 Muscle weakness (generalized): Secondary | ICD-10-CM

## 2020-07-06 NOTE — Therapy (Signed)
[facility] [address] [place], [place], [address] Phone: [phone]   Fax:  [phone]  Physical Therapy Treatment  Patient Details  Name: Nicole Barr MRN: [id number] Date of Birth: Sep 27, 1945 Referring Provider (PT): Lyndal Pulley, DO   Encounter Date: 07/06/2020   PT End of Session - 07/06/20 1052    Visit Number 5    Date for PT Re-Evaluation 09/07/20    Authorization Type Medicare/AARP    Progress Note Due on Visit 10    PT Start Time 9622    PT Stop Time 1057    PT Time Calculation (min) 42 min    Activity Tolerance Patient tolerated treatment well    Behavior During Therapy [facility] for tasks assessed/performed           Past Medical History:  Diagnosis Date  . Alcoholic cirrhosis ([place])   . Ascites   . Cirrhosis ([place])   . Endometriosis   . GERD (gastroesophageal reflux disease)   . Liver cyst     Past Surgical History:  Procedure Laterality Date  . CAROTID ENDARTERECTOMY Right 2008  . egd with banding  2016   with bleeding [facility]  . ESOPHAGOGASTRODUODENOSCOPY (EGD) WITH PROPOFOL N/A 05/02/2015   Procedure: ESOPHAGOGASTRODUODENOSCOPY (EGD) WITH PROPOFOL;  Surgeon: Arta Silence, MD;  [facility];  Service: Endoscopy;  Laterality: N/A;  . ESOPHAGOGASTRODUODENOSCOPY (EGD) WITH PROPOFOL N/A 06/06/2015   Procedure: ESOPHAGOGASTRODUODENOSCOPY (EGD) WITH PROPOFOL;  Surgeon: Arta Silence, MD;  [facility];  Service: Endoscopy;  Laterality: N/A;  . GASTRIC VARICES BANDING N/A 05/02/2015   Procedure: GASTRIC VARICES BANDING;  Surgeon: Arta Silence, MD;  [facility];  Service: Endoscopy;  Laterality: N/A;  . HEMORROIDECTOMY    . LAMINECTOMY  1989   lumbar 4-5  . lapaendectomy  yrs ago  . LAPAROSCOPY  1989   adhesions  . oophrectomy Left 1989  . TONSILLECTOMY     age 39    There were no vitals filed for this visit.   Subjective  Assessment - 07/06/20 1022    Subjective I am making good progress.  I just came from the gym and walked a mile.    Pertinent History Vit D deficiency, polyarthralgia, has stone bruise in Lt foot, lumbar laminectomy 1989 L4/5    How long can you stand comfortably? 2 hours    How long can you walk comfortably? 2 hours    Diagnostic tests xray Rt hip negative, xray lumbar chronic compression fx T11, degenerative changes mostly L1/2    Patient Stated Goals make sure hip is in alignment, keep from having to have a hip replacement    Currently in Pain? Yes    Pain Score 2     Pain Location Hip    Pain Orientation Right    Pain Descriptors / Indicators Squeezing    Pain Type Chronic pain    Pain Onset More than a month ago    Pain Frequency Intermittent    Aggravating Factors  sitting, sit to stand after sitting, waking in AM    Pain Relieving Factors movement, change positions                             [facility] Adult PT Treatment/Exercise - 07/06/20 0001      Exercises   Exercises Lumbar;Knee/Hip      Lumbar Exercises: Supine   Bridge with clamshell 10  reps    Bridge with [employer] Limitations red loop band      Knee/Hip Exercises: Aerobic   Nustep lvl 5 x 8 min, PT present to monitor   30 sec on/off LEs only     Knee/Hip Exercises: Machines for Strengthening   Cybex Leg Press 1x20 repetitions; 80#; seat 4   VC push through heels vs toes     Knee/Hip Exercises: Standing   Lateral Step Up Both;2 sets;Hand Hold: 1;Step Height: 6"    Lateral Step Up Limitations 2x10    SLS with Vectors clock taps 5 cycles in SLS Rt/Lt x 5 rounds, TC for neutral Rt hip positioning      Knee/Hip Exercises: Seated   Sit to Sand 5 reps;without UE support   red loop band     Knee/Hip Exercises: Sidelying   Clams 2x10; red loop; Lt LE               Balance Exercises - 07/06/20 0001      Balance Exercises: Standing   Tandem Stance Foam/compliant surface;1 rep;30 secs;Eyes  open   modified tandem, VC for equal weight b/w LEs and TA use              PT Short Term Goals - 07/06/20 1056      PT SHORT TERM GOAL #1   Title Pt will be ind with initial HEP    Status On-going      PT SHORT TERM GOAL #2   Title Pt will report at least 30% less Rt hip pain with daily activities between PT visits secondary to improved pelvic alignment.    Status Achieved      PT SHORT TERM GOAL #3   Title Pt will achieve Rt quad and QL length WNL    Status On-going             PT Long Term Goals - 06/15/20 1233      PT LONG TERM GOAL #1   Title Pt will demo improved Rt SI joint and hip stability with stance phase of gait, squat and step ups to 6" riser without unlocking Rt SI joint.    Time 12    Period Weeks    Status New    Target Date 09/07/20      PT LONG TERM GOAL #2   Title Pt will report improved Rt hip pain with daily activities by at least 70%    Time 12    Period Weeks    Status New    Target Date 09/07/20      PT LONG TERM GOAL #3   Title Pt will achieve improved Rt hip strength of at least 4+/5 for hip flexion, abd, ext and ER to improve ability to maintain alignment of Rt hemipelvis for dynamic tasks.    Time 12    Period Weeks    Status New    Target Date 09/07/20      PT LONG TERM GOAL #4   Title Pt will improve FOTO score from 60% to 70% to demo improved function of Rt hip.    Baseline 60%    Time 12    Period Weeks    Status New    Target Date 09/07/20      PT LONG TERM GOAL #5   Title Pt will be ind with advanced HEP and understand how to safely progress.    Time 12    Period Weeks    Status New  Target Date 09/07/20                 Plan - 07/06/20 1053    Clinical Impression Statement Pt reports signif improvement in pain with PT to date.  She needed TC for Rt hip neutral posiitoning today with SLS clock vectors and needs ongoing reminders for TA use for pelvic stability such as during SL clams and tandem balance  work.  VCs to push through heels vs toes with leg press today to enhance use of gluts.  She is improving her awareness of glut med with lateral step ups.  Good alignment and positioning of SI joints today so no manual techniques needed.  Good tolerance of all ther ex today.  Progressed NuStep to LEs only every 30 sec over 8' end of session.  Conitnue along POC with ongoing assessment.    Comorbidities Hx of lumbar laminectomy    Rehab Potential Good    PT Frequency 2x / week    PT Duration 12 weeks    PT Treatment/Interventions ADLs/Self Care Home Management;Electrical Stimulation;Cryotherapy;Moist Heat;Spinal Manipulations;Joint Manipulations;Stair training;Functional mobility training;Therapeutic activities;Therapeutic exercise;Balance training;Neuromuscular re-education;Manual techniques;Patient/family education;Dry needling;Taping    PT Next Visit Plan continue manual techniques as needed; progress functional strength and balance to patient tolerance    PT Home Exercise Plan Access Code: [email or web address]    Consulted and Agree with Plan of Care Patient           Patient will benefit from skilled therapeutic intervention in order to improve the following deficits and impairments:     Visit Diagnosis: Pain in right hip  Muscle weakness (generalized)  Cramp and spasm     Problem List Patient Active Problem List   Diagnosis Date Noted  . Nonrheumatic mitral valve regurgitation 06/15/2020  . Aortic atherosclerosis ([place]) 06/15/2020  . Nonallopathic lesion of sacral region 06/08/2020  . Low back pain 01/26/2017  . Peripheral neuropathy 07/18/2016  . Loss of transverse plantar arch 07/18/2016  . Medication dosing interval changed 09/12/2015  . Increased abdominal girth 04/04/2015  . Coronary artery disease due to lipid rich plaque 04/04/2015  . Lung nodule 11/07/2014  . Altered blood in stool 09/27/2014  . ALC (alcoholic liver cirrhosis) ([place]) 07/26/2014  . Endometriosis 07/26/2014  .  Bleeding esophageal varices ([place]) 07/26/2014  . Acute blood loss anemia 03/23/2014  . Below normal amount of sodium in the blood 03/23/2014  . Drug-induced hypokalemia 02/17/2014  . Alcohol abuse, in remission 02/08/2014  . Biliary cirrhosis ([place]) 02/08/2014  . Abdominal dropsy 02/02/2014  . H/O cardiovascular disorder 02/02/2014  . Breath shortness 02/02/2014  . Cirrhosis ([place]) 11/30/2013  . Clinical depression 07/23/2004  . Chronic nonalcoholic liver disease 07/37/1062  . Hypercholesterolemia without hypertriglyceridemia 07/23/2004    Baruch Merl, PT 07/06/20 10:58 AM   [facility] [address] [place], [place], [address] Phone: [phone]   Fax:  [phone]  Name: ZARIEL CAPANO MRN: [id number] Date of Birth: [birth year]

## 2020-07-09 ENCOUNTER — Other Ambulatory Visit: Payer: Self-pay | Admitting: Gastroenterology

## 2020-07-09 DIAGNOSIS — K7469 Other cirrhosis of liver: Secondary | ICD-10-CM

## 2020-07-10 ENCOUNTER — Ambulatory Visit
Admission: RE | Admit: 2020-07-10 | Discharge: 2020-07-10 | Disposition: A | Discharge status: Home / Self Care | Payer: Medicare Other | Source: Ambulatory Visit | Attending: Gastroenterology | Admitting: Gastroenterology

## 2020-07-10 DIAGNOSIS — K7689 Other specified diseases of liver: Secondary | ICD-10-CM | POA: Diagnosis not present

## 2020-07-10 DIAGNOSIS — K7460 Unspecified cirrhosis of liver: Secondary | ICD-10-CM | POA: Diagnosis not present

## 2020-07-10 DIAGNOSIS — K7469 Other cirrhosis of liver: Secondary | ICD-10-CM

## 2020-07-11 ENCOUNTER — Encounter: Payer: Self-pay | Admitting: Physical Therapy

## 2020-07-11 ENCOUNTER — Other Ambulatory Visit: Payer: Self-pay

## 2020-07-11 ENCOUNTER — Ambulatory Visit: Payer: Medicare Other | Admitting: Physical Therapy

## 2020-07-11 DIAGNOSIS — M25551 Pain in right hip: Secondary | ICD-10-CM | POA: Diagnosis not present

## 2020-07-11 DIAGNOSIS — M6281 Muscle weakness (generalized): Secondary | ICD-10-CM

## 2020-07-11 DIAGNOSIS — R252 Cramp and spasm: Secondary | ICD-10-CM | POA: Diagnosis not present

## 2020-07-11 NOTE — Therapy (Signed)
[facility] [address] [place], [place], [address] Phone: [phone]   Fax:  [phone]  Physical Therapy Treatment  Patient Details  Name: Nicole Barr MRN: [id number] Date of Birth: 1945-08-04 Referring Provider (PT): Lyndal Pulley, DO   Encounter Date: 07/11/2020   PT End of Session - 07/11/20 0938    Visit Number 6    Date for PT Re-Evaluation 09/07/20    Authorization Type Medicare/AARP    Progress Note Due on Visit 10    PT Start Time 0846    PT Stop Time 0928    PT Time Calculation (min) 42 min    Activity Tolerance Patient limited by pain    Behavior During Therapy [facility] for tasks assessed/performed           Past Medical History:  Diagnosis Date  . Alcoholic cirrhosis ([place])   . Ascites   . Cirrhosis ([place])   . Endometriosis   . GERD (gastroesophageal reflux disease)   . Liver cyst     Past Surgical History:  Procedure Laterality Date  . CAROTID ENDARTERECTOMY Right 2008  . egd with banding  2016   with bleeding [facility]  . ESOPHAGOGASTRODUODENOSCOPY (EGD) WITH PROPOFOL N/A 05/02/2015   Procedure: ESOPHAGOGASTRODUODENOSCOPY (EGD) WITH PROPOFOL;  Surgeon: Arta Silence, MD;  Location: [facility];  Service: Endoscopy;  Laterality: N/A;  . ESOPHAGOGASTRODUODENOSCOPY (EGD) WITH PROPOFOL N/A 06/06/2015   Procedure: ESOPHAGOGASTRODUODENOSCOPY (EGD) WITH PROPOFOL;  Surgeon: Arta Silence, MD;  Location: [facility];  Service: Endoscopy;  Laterality: N/A;  . GASTRIC VARICES BANDING N/A 05/02/2015   Procedure: GASTRIC VARICES BANDING;  Surgeon: Arta Silence, MD;  Location: [facility];  Service: Endoscopy;  Laterality: N/A;  . HEMORROIDECTOMY    . LAMINECTOMY  1989   lumbar 4-5  . lapaendectomy  yrs ago  . LAPAROSCOPY  1989   adhesions  . oophrectomy Left 1989  . TONSILLECTOMY     age 75    There were no vitals filed for this visit.   Subjective Assessment -  07/11/20 0848    Subjective Patient reports increased pain last night. Had to take NSAIDs to sleep. Continues to have increased discomfort today. Feels that her hip is "out of location".    Pertinent History Vit D deficiency, polyarthralgia, has stone bruise in Lt foot, lumbar laminectomy 1989 L4/5    How long can you sit comfortably? 10 min    How long can you stand comfortably? 2 hours    How long can you walk comfortably? 2 hours    Diagnostic tests xray Rt hip negative, xray lumbar chronic compression fx T11, degenerative changes mostly L1/2    Patient Stated Goals make sure hip is in alignment, keep from having to have a hip replacement    Currently in Pain? Yes    Pain Score 5     Pain Location Hip    Pain Orientation Right    Pain Descriptors / Indicators Aching    Pain Type Chronic pain    Pain Onset More than a month ago    Pain Frequency Intermittent    Multiple Pain Sites No                             [facility] Adult PT Treatment/Exercise - 07/11/20 0001      Static Standing Balance   Tandem Stance - Right Leg --   modified tandem  black foam, 2x30s   Tandem Stance - Left Leg --   modified tandem black foam, 2x30s     Knee/Hip Exercises: Standing   Lateral Step Up Limitations x5 reps; increased pain Rt LE. Unable to perform. Regressed to 4 in step with patient still unable to perform      Knee/Hip Exercises: Seated   Sit to Sand 5 reps;without UE support   red loop at knees     Knee/Hip Exercises: Supine   Bridges Strengthening;Both;2 sets;10 reps    Bridges Limitations red loop at knees      Knee/Hip Exercises: Sidelying   Clams 2x10; red loop; Lt LE      Manual Therapy   Manual Therapy Joint mobilization;Muscle Energy Technique;Soft tissue mobilization    Joint Mobilization thigh thrus SI mob; lateral and inferior hip distraction mob; anterior femoral head mob    Soft tissue mobilization glute med, glute min, and IT band for decreased pain     Muscle Energy Technique supine hooklying 5x5s hold abd/add isometrics                  PT Education - 07/11/20 0932    Education Details DN handout    Person(s) Educated Patient    Methods Explanation    Comprehension Verbalized understanding            PT Short Term Goals - 07/06/20 1056      PT SHORT TERM GOAL #1   Title Pt will be ind with initial HEP    Status On-going      PT SHORT TERM GOAL #2   Title Pt will report at least 30% less Rt hip pain with daily activities between PT visits secondary to improved pelvic alignment.    Status Achieved      PT SHORT TERM GOAL #3   Title Pt will achieve Rt quad and QL length WNL    Status On-going             PT Long Term Goals - 06/15/20 1233      PT LONG TERM GOAL #1   Title Pt will demo improved Rt SI joint and hip stability with stance phase of gait, squat and step ups to 6" riser without unlocking Rt SI joint.    Time 12    Period Weeks    Status New    Target Date 09/07/20      PT LONG TERM GOAL #2   Title Pt will report improved Rt hip pain with daily activities by at least 70%    Time 12    Period Weeks    Status New    Target Date 09/07/20      PT LONG TERM GOAL #3   Title Pt will achieve improved Rt hip strength of at least 4+/5 for hip flexion, abd, ext and ER to improve ability to maintain alignment of Rt hemipelvis for dynamic tasks.    Time 12    Period Weeks    Status New    Target Date 09/07/20      PT LONG TERM GOAL #4   Title Pt will improve FOTO score from 60% to 70% to demo improved function of Rt hip.    Baseline 60%    Time 12    Period Weeks    Status New    Target Date 09/07/20      PT LONG TERM GOAL #5   Title Pt will be ind with advanced HEP and  understand how to safely progress.    Time 12    Period Weeks    Status New    Target Date 09/07/20                 Plan - 07/11/20 0932    Clinical Impression Statement Patient reports increased pain this session.  Unable to perform lateral step up activity without increased pain. Reporting significant tenderness at posterior aspect of Rt hip joint with palpation. Reporting intermittent Rt LE hamstring cramping when performing bridging exericse. States that cramping decreased with verbal cues for bringing feet more proximal to buttocks and tactile cues for increased glute activation. Would benefit from continued intervention to address impairments for decreased pain and improved functional activity tolerance.    Personal Factors and Comorbidities Age;Time since onset of injury/illness/exacerbation;Comorbidity 1    Comorbidities Hx of lumbar laminectomy    Examination-Activity Limitations Transfers;Squat;Stairs;Sit;Bend    Examination-Participation Restrictions Community Activity;Shop;Cleaning;Laundry    Rehab Potential Good    PT Frequency 2x / week    PT Duration 12 weeks    PT Treatment/Interventions ADLs/Self Care Home Management;Electrical Stimulation;Cryotherapy;Moist Heat;Spinal Manipulations;Joint Manipulations;Stair training;Functional mobility training;Therapeutic activities;Therapeutic exercise;Balance training;Neuromuscular re-education;Manual techniques;Patient/family education;Dry needling;Taping    PT Next Visit Plan progress functionl strength and balance as patient able with focus on glute strength; continue manual techniques    PT Home Exercise Plan Access Code: [email or web address]    Consulted and Agree with Plan of Care Patient           Patient will benefit from skilled therapeutic intervention in order to improve the following deficits and impairments:  Decreased range of motion,Postural dysfunction,Decreased strength,Decreased mobility,Improper body mechanics,Impaired flexibility,Decreased balance,Pain,Increased muscle spasms  Visit Diagnosis: Pain in right hip  Muscle weakness (generalized)  Cramp and spasm     Problem List Patient Active Problem List   Diagnosis Date Noted  .  Nonrheumatic mitral valve regurgitation 06/15/2020  . Aortic atherosclerosis ([place]) 06/15/2020  . Nonallopathic lesion of sacral region 06/08/2020  . Low back pain 01/26/2017  . Peripheral neuropathy 07/18/2016  . Loss of transverse plantar arch 07/18/2016  . Medication dosing interval changed 09/12/2015  . Increased abdominal girth 04/04/2015  . Coronary artery disease due to lipid rich plaque 04/04/2015  . Lung nodule 11/07/2014  . Altered blood in stool 09/27/2014  . ALC (alcoholic liver cirrhosis) ([place]) 07/26/2014  . Endometriosis 07/26/2014  . Bleeding esophageal varices ([place]) 07/26/2014  . Acute blood loss anemia 03/23/2014  . Below normal amount of sodium in the blood 03/23/2014  . Drug-induced hypokalemia 02/17/2014  . Alcohol abuse, in remission 02/08/2014  . Biliary cirrhosis ([place]) 02/08/2014  . Abdominal dropsy 02/02/2014  . H/O cardiovascular disorder 02/02/2014  . Breath shortness 02/02/2014  . Cirrhosis ([place]) 11/30/2013  . Clinical depression 07/23/2004  . Chronic nonalcoholic liver disease 93/71/6967  . Hypercholesterolemia without hypertriglyceridemia 07/23/2004   Everardo All PT, DPT  07/11/20 9:40 AM   [facility] [address], [place], [place], [address] Phone: [phone]   Fax:  [phone]  Name: Nicole Barr MRN: [id number] Date of Birth: 1945-12-28

## 2020-07-11 NOTE — Patient Instructions (Signed)

## 2020-07-13 ENCOUNTER — Ambulatory Visit: Payer: Medicare Other | Admitting: Physical Therapy

## 2020-07-13 ENCOUNTER — Other Ambulatory Visit: Payer: Self-pay

## 2020-07-13 ENCOUNTER — Encounter: Payer: Self-pay | Admitting: Physical Therapy

## 2020-07-13 DIAGNOSIS — M6281 Muscle weakness (generalized): Secondary | ICD-10-CM | POA: Diagnosis not present

## 2020-07-13 DIAGNOSIS — M25551 Pain in right hip: Secondary | ICD-10-CM

## 2020-07-13 DIAGNOSIS — R252 Cramp and spasm: Secondary | ICD-10-CM

## 2020-07-13 NOTE — Therapy (Signed)
[facility] [address] [place], [place], [address] Phone: [phone]   Fax:  [phone]  Physical Therapy Treatment  Patient Details  Name: Nicole Barr MRN: [id number] Date of Birth: [birth year] Referring Provider (PT): Lyndal Pulley, DO   Encounter Date: 07/13/2020   PT End of Session - 07/13/20 1013    Visit Number 7    Date for PT Re-Evaluation 09/07/20    Authorization Type Medicare/AARP    Progress Note Due on Visit 10    PT Start Time 1010    PT Stop Time 1100    PT Time Calculation (min) 50 min    Activity Tolerance Patient limited by pain    Behavior During Therapy [facility] for tasks assessed/performed           Past Medical History:  Diagnosis Date  . Alcoholic cirrhosis ([place])   . Ascites   . Cirrhosis ([place])   . Endometriosis   . GERD (gastroesophageal reflux disease)   . Liver cyst     Past Surgical History:  Procedure Laterality Date  . CAROTID ENDARTERECTOMY Right 2008  . egd with banding  2016   with bleeding after wards done [facility]  . ESOPHAGOGASTRODUODENOSCOPY (EGD) WITH PROPOFOL N/A 05/02/2015   Procedure: ESOPHAGOGASTRODUODENOSCOPY (EGD) WITH PROPOFOL;  Surgeon: Arta Silence, MD;  [facility];  Service: Endoscopy;  Laterality: N/A;  . ESOPHAGOGASTRODUODENOSCOPY (EGD) WITH PROPOFOL N/A 06/06/2015   Procedure: ESOPHAGOGASTRODUODENOSCOPY (EGD) WITH PROPOFOL;  Surgeon: Arta Silence, MD;  [facility];  Service: Endoscopy;  Laterality: N/A;  . GASTRIC VARICES BANDING N/A 05/02/2015   Procedure: GASTRIC VARICES BANDING;  Surgeon: Arta Silence, MD;  [facility];  Service: Endoscopy;  Laterality: N/A;  . HEMORROIDECTOMY    . LAMINECTOMY  1989   lumbar 4-5  . lapaendectomy  yrs ago  . LAPAROSCOPY  1989   adhesions  . oophrectomy Left 1989  . TONSILLECTOMY     age 4    There were no vitals filed for this visit.   Subjective Assessment -  07/13/20 1009    Subjective I continue to feel like my hip is still out of position and I am frustrated.  I have been at the gym for an hour already today.    Pertinent History Vit D deficiency, polyarthralgia, has stone bruise in Lt foot, lumbar laminectomy 1989 L4/5    How long can you sit comfortably? 10 min    How long can you stand comfortably? 2 hours    How long can you walk comfortably? 2 hours    Diagnostic tests xray Rt hip negative, xray lumbar chronic compression fx T11, degenerative changes mostly L1/2    Patient Stated Goals make sure hip is in alignment, keep from having to have a hip replacement    Currently in Pain? Yes    Pain Score 5     Pain Location Hip    Pain Orientation Right    Pain Descriptors / Indicators Aching    Pain Type Chronic pain    Pain Onset More than a month ago    Pain Frequency Intermittent    Aggravating Factors  sitting, weight bearing into Rt hip, sit to stand after sitting, waking in AM    Pain Relieving Factors movement, change positions                             [facility] Adult PT  Treatment/Exercise - 07/13/20 0001      Exercises   Exercises Lumbar;Knee/Hip      Lumbar Exercises: Stretches   Quad Stretch Right;1 rep;30 seconds    Quad Stretch Limitations passive stretch by PT      Lumbar Exercises: Standing   Other Standing Lumbar Exercises standing SB stretch with overhead reach for QL 1x20 each side, VC to breathe into stretch, then A/ROM trunk SB x 10 reps      Lumbar Exercises: Quadruped   [place]/Old Horse 10 reps    Madcat/Old Horse Limitations emphasis on lumbar ext    Other Quadruped Lumbar Exercises quadruped rocking with tailbone lift to enhance lumbar ext x 20 rocks, small range      Knee/Hip Exercises: Stretches   Other Knee/Hip Stretches butterfly stretch x 60" supine bil    Other Knee/Hip Stretches frog stretch in supine bil x 30"      Manual Therapy   Manual Therapy Joint mobilization;Soft tissue  mobilization;Passive ROM;Muscle Energy Technique    Joint Mobilization UPAs Rt L2-S1 Gr II/III, sacral rocking bil Gr II/III    Passive ROM Rt hip circumduction in Lt SL x 10 reps, passive stretch Rt quad with contract/relax in Lt SL    Muscle Energy Technique inflare correction, Lt sacral rotation correction                    PT Short Term Goals - 07/06/20 1056      PT SHORT TERM GOAL #1   Title Pt will be ind with initial HEP    Status On-going      PT SHORT TERM GOAL #2   Title Pt will report at least 30% less Rt hip pain with daily activities between PT visits secondary to improved pelvic alignment.    Status Achieved      PT SHORT TERM GOAL #3   Title Pt will achieve Rt quad and QL length WNL    Status On-going             PT Long Term Goals - 06/15/20 1233      PT LONG TERM GOAL #1   Title Pt will demo improved Rt SI joint and hip stability with stance phase of gait, squat and step ups to 6" riser without unlocking Rt SI joint.    Time 12    Period Weeks    Status New    Target Date 09/07/20      PT LONG TERM GOAL #2   Title Pt will report improved Rt hip pain with daily activities by at least 70%    Time 12    Period Weeks    Status New    Target Date 09/07/20      PT LONG TERM GOAL #3   Title Pt will achieve improved Rt hip strength of at least 4+/5 for hip flexion, abd, ext and ER to improve ability to maintain alignment of Rt hemipelvis for dynamic tasks.    Time 12    Period Weeks    Status New    Target Date 09/07/20      PT LONG TERM GOAL #4   Title Pt will improve FOTO score from 60% to 70% to demo improved function of Rt hip.    Baseline 60%    Time 12    Period Weeks    Status New    Target Date 09/07/20      PT LONG TERM GOAL #5   Title Pt  will be ind with advanced HEP and understand how to safely progress.    Time 12    Period Weeks    Status New    Target Date 09/07/20                 Plan - 07/13/20 1152     Clinical Impression Statement Pt arrived frustrated with ongoing Rt hip pain and "rubbing" with a feeling that it is out of place and muscles are now inflammed.  Pt presents with Rt SI joint hypomobility and lumbar Rt facet stiffness L2-S1.  She has poor lumbar fascial mobility Rt>Lt.  She has diffuse restriction of soft tissue influences on the pelvis including Rt lumbar fascia and paraspinals, Rt QL, adductors and quads all of which are tight and tender.  PT focused on manual release techniques, joint mobs and passive/active stretching to improve tissue and lumbar joint mobility and reduce undue strain on pelvis and hip from LE muscles.  PT continued discussion about DN with Pt who continues to be unsure of comfort level with this. Improved lumbar facet mobility and fascia end of session > improved Rt SI joint mobility.  Pt did report resolution of pain and friction end of session.  She will benefit from continued skilled therapy to address deficits and pain.    Comorbidities Hx of lumbar laminectomy    PT Next Visit Plan continue lumbar STM/fascial release, UPAs Rt facets, spinal mobility and stretching, LE stretches, DN if Pt ever comfortable (Rt QL, glut med, lumbar multif)    PT Home Exercise Plan Access Code: [email or web address]    Consulted and Agree with Plan of Care Patient           Patient will benefit from skilled therapeutic intervention in order to improve the following deficits and impairments:     Visit Diagnosis: Pain in right hip  Muscle weakness (generalized)  Cramp and spasm     Problem List Patient Active Problem List   Diagnosis Date Noted  . Nonrheumatic mitral valve regurgitation 06/15/2020  . Aortic atherosclerosis ([place]) 06/15/2020  . Nonallopathic lesion of sacral region 06/08/2020  . Low back pain 01/26/2017  . Peripheral neuropathy 07/18/2016  . Loss of transverse plantar arch 07/18/2016  . Medication dosing interval changed 09/12/2015  . Increased abdominal girth  04/04/2015  . Coronary artery disease due to lipid rich plaque 04/04/2015  . Lung nodule 11/07/2014  . Altered blood in stool 09/27/2014  . ALC (alcoholic liver cirrhosis) ([place]) 07/26/2014  . Endometriosis 07/26/2014  . Bleeding esophageal varices ([place]) 07/26/2014  . Acute blood loss anemia 03/23/2014  . Below normal amount of sodium in the blood 03/23/2014  . Drug-induced hypokalemia 02/17/2014  . Alcohol abuse, in remission 02/08/2014  . Biliary cirrhosis ([place]) 02/08/2014  . Abdominal dropsy 02/02/2014  . H/O cardiovascular disorder 02/02/2014  . Breath shortness 02/02/2014  . Cirrhosis ([place]) 11/30/2013  . Clinical depression 07/23/2004  . Chronic nonalcoholic liver disease 20/25/4270  . Hypercholesterolemia without hypertriglyceridemia 07/23/2004    Baruch Merl, PT 07/13/20 12:01 PM   [facility] [address], [place], [place], [address] Phone: [phone]   Fax:  [phone]  Name: Nicole Barr MRN: [id number] Date of Birth: [birth year]

## 2020-07-17 ENCOUNTER — Ambulatory Visit: Payer: Medicare Other | Admitting: Physical Therapy

## 2020-07-17 ENCOUNTER — Encounter: Payer: Self-pay | Admitting: Physical Therapy

## 2020-07-17 ENCOUNTER — Other Ambulatory Visit: Payer: Self-pay

## 2020-07-17 DIAGNOSIS — M6281 Muscle weakness (generalized): Secondary | ICD-10-CM | POA: Diagnosis not present

## 2020-07-17 DIAGNOSIS — M25551 Pain in right hip: Secondary | ICD-10-CM

## 2020-07-17 DIAGNOSIS — R252 Cramp and spasm: Secondary | ICD-10-CM | POA: Diagnosis not present

## 2020-07-17 NOTE — Therapy (Signed)
[facility] [address] [place], [place], [address] Phone: [phone]   Fax:  [phone]  Physical Therapy Treatment  Patient Details  Name: Nicole Barr MRN: [id number] Date of Birth: [birth year] Referring Provider (PT): Lyndal Pulley, DO   Encounter Date: 07/17/2020   PT End of Session - 07/17/20 1014    Visit Number 8    Date for PT Re-Evaluation 09/07/20    Authorization Type Medicare/AARP    Progress Note Due on Visit 10    PT Start Time 0930    PT Stop Time 1011    PT Time Calculation (min) 41 min    Activity Tolerance Patient limited by pain;Patient tolerated treatment well    Behavior During Therapy [facility] for tasks assessed/performed           Past Medical History:  Diagnosis Date  . Alcoholic cirrhosis ([place])   . Ascites   . Cirrhosis ([place])   . Endometriosis   . GERD (gastroesophageal reflux disease)   . Liver cyst     Past Surgical History:  Procedure Laterality Date  . CAROTID ENDARTERECTOMY Right 2008  . egd with banding  2016   with bleeding [facility]  . ESOPHAGOGASTRODUODENOSCOPY (EGD) WITH PROPOFOL N/A 05/02/2015   Procedure: ESOPHAGOGASTRODUODENOSCOPY (EGD) WITH PROPOFOL;  Surgeon: Arta Silence, MD;  [facility];  Service: Endoscopy;  Laterality: N/A;  . ESOPHAGOGASTRODUODENOSCOPY (EGD) WITH PROPOFOL N/A 06/06/2015   Procedure: ESOPHAGOGASTRODUODENOSCOPY (EGD) WITH PROPOFOL;  Surgeon: Arta Silence, MD;  [facility];  Service: Endoscopy;  Laterality: N/A;  . GASTRIC VARICES BANDING N/A 05/02/2015   Procedure: GASTRIC VARICES BANDING;  Surgeon: Arta Silence, MD;  [facility];  Service: Endoscopy;  Laterality: N/A;  . HEMORROIDECTOMY    . LAMINECTOMY  1989   lumbar 4-5  . lapaendectomy  yrs ago  . LAPAROSCOPY  1989   adhesions  . oophrectomy Left 1989  . TONSILLECTOMY     age 75    There were no vitals filed for this  visit.   Subjective Assessment - 07/17/20 0934    Subjective Patient reports significantly increased pain Thursday. Took the matress topper off of her bed and feels that this has helped with pain. Wants to hold off on DN until her MD visit next week.    Pertinent History Vit D deficiency, polyarthralgia, has stone bruise in Lt foot, lumbar laminectomy 1989 L4/5    How long can you sit comfortably? 10 min    How long can you stand comfortably? 2 hours    How long can you walk comfortably? 2 hours    Diagnostic tests xray Rt hip negative, xray lumbar chronic compression fx T11, degenerative changes mostly L1/2    Patient Stated Goals make sure hip is in alignment, keep from having to have a hip replacement    Pain Score 2     Pain Location Hip    Pain Orientation Right    Pain Descriptors / Indicators Aching    Pain Type Chronic pain    Pain Onset More than a month ago                             [facility] Adult PT Treatment/Exercise - 07/17/20 0001      Lumbar Exercises: Quadruped   Madcat/Old Horse 10 reps    Straight Leg Raise 10 reps    Straight Leg Raises Limitations  VC/TC for pelvic rotation    Other Quadruped Lumbar Exercises frog stretch 3x10s      Knee/Hip Exercises: Stretches   Other Knee/Hip Stretches butterfly stretch x 60" supine bil      Knee/Hip Exercises: Aerobic   Nustep lvl 6 x 7 min; PT present to discuss patient status      Knee/Hip Exercises: Machines for Strengthening   Cybex Leg Press 1x20 repetitions; 80#; seat 4      Knee/Hip Exercises: Seated   Sit to Sand --   2x8 reps with red loop at knees     Knee/Hip Exercises: Supine   Hip Adduction Isometric Both;1 set;10 reps    Hip Adduction Isometric Limitations 5s hold    Other Supine Knee/Hip Exercises hip abd isometric 10 x 5s hold      Manual Therapy   Manual Therapy Joint mobilization;Soft tissue mobilization;Muscle Energy Technique    Joint Mobilization posterior, lateral, inferior  hip distraction mobs; thigh thrust SI moblization    Passive ROM Rt hip circumduction in Lt SL x 10 reps, increased pain this date    Muscle Energy Technique hip abd/add x5s hold each x5 repetitions                    PT Short Term Goals - 07/06/20 1056      PT SHORT TERM GOAL #1   Title Pt will be ind with initial HEP    Status On-going      PT SHORT TERM GOAL #2   Title Pt will report at least 30% less Rt hip pain with daily activities between PT visits secondary to improved pelvic alignment.    Status Achieved      PT SHORT TERM GOAL #3   Title Pt will achieve Rt quad and QL length WNL    Status On-going             PT Long Term Goals - 07/17/20 1013      PT LONG TERM GOAL #3   Title Pt will achieve improved Rt hip strength of at least 4+/5 for hip flexion, abd, ext and ER to improve ability to maintain alignment of Rt hemipelvis for dynamic tasks.    Time 12    Period Weeks    Status Partially Met   hip ext and ER 4+/5                Plan - 07/17/20 1010    Clinical Impression Statement Patient reporting point tenderness over superior Rt corner of sacrum. Noting continued strength impairments of Rt glute med and Rt hip flexors with MMT this date however ER and hip ext 4+/5 which indicates significant improvement. Demonstrating Rt LE offloading when performing sit to stand activity this date. Requiring VC/TC for decreased pelvic rotation when perfroming quadruped hip extension exericse. Would benefit from continued skilled intervention to address impairments for decreased pain and improved functional mobility tolerance.    Personal Factors and Comorbidities Age;Time since onset of injury/illness/exacerbation;Comorbidity 1    Comorbidities Hx of lumbar laminectomy    Examination-Activity Limitations Transfers;Squat;Stairs;Sit;Bend    Examination-Participation Restrictions Community Activity;Shop;Cleaning;Laundry    Rehab Potential Good    PT Frequency 2x /  week    PT Duration 12 weeks    PT Treatment/Interventions ADLs/Self Care Home Management;Electrical Stimulation;Cryotherapy;Moist Heat;Spinal Manipulations;Joint Manipulations;Stair training;Functional mobility training;Therapeutic activities;Therapeutic exercise;Balance training;Neuromuscular re-education;Manual techniques;Patient/family education;Dry needling;Taping    PT Next Visit Plan continue lumbar STM and other manual techniques as needed; progress  lumbar/pelvic mobility to patient tolerance; strengthening with bil LE stabilization    PT Home Exercise Plan Access Code: [email or web address]    Consulted and Agree with Plan of Care Patient           Patient will benefit from skilled therapeutic intervention in order to improve the following deficits and impairments:  Decreased range of motion,Postural dysfunction,Decreased strength,Decreased mobility,Improper body mechanics,Impaired flexibility,Decreased balance,Pain,Increased muscle spasms  Visit Diagnosis: Pain in right hip  Muscle weakness (generalized)  Cramp and spasm     Problem List Patient Active Problem List   Diagnosis Date Noted  . Nonrheumatic mitral valve regurgitation 06/15/2020  . Aortic atherosclerosis ([place]) 06/15/2020  . Nonallopathic lesion of sacral region 06/08/2020  . Low back pain 01/26/2017  . Peripheral neuropathy 07/18/2016  . Loss of transverse plantar arch 07/18/2016  . Medication dosing interval changed 09/12/2015  . Increased abdominal girth 04/04/2015  . Coronary artery disease due to lipid rich plaque 04/04/2015  . Lung nodule 11/07/2014  . Altered blood in stool 09/27/2014  . ALC (alcoholic liver cirrhosis) ([place]) 07/26/2014  . Endometriosis 07/26/2014  . Bleeding esophageal varices ([place]) 07/26/2014  . Acute blood loss anemia 03/23/2014  . Below normal amount of sodium in the blood 03/23/2014  . Drug-induced hypokalemia 02/17/2014  . Alcohol abuse, in remission 02/08/2014  . Biliary cirrhosis  ([place]) 02/08/2014  . Abdominal dropsy 02/02/2014  . H/O cardiovascular disorder 02/02/2014  . Breath shortness 02/02/2014  . Cirrhosis ([place]) 11/30/2013  . Clinical depression 07/23/2004  . Chronic nonalcoholic liver disease 78/00/4471  . Hypercholesterolemia without hypertriglyceridemia 07/23/2004   Everardo All PT, DPT  07/17/20 10:15 AM    [facility] [address] [place], [place], [address] Phone: [phone]   Fax:  [phone]  Name: Nicole Barr MRN: [id number] Date of Birth: Oct 19, 1945

## 2020-07-19 ENCOUNTER — Other Ambulatory Visit: Payer: Self-pay

## 2020-07-19 ENCOUNTER — Ambulatory Visit: Payer: Medicare Other | Admitting: Physical Therapy

## 2020-07-19 ENCOUNTER — Encounter: Payer: Self-pay | Admitting: Physical Therapy

## 2020-07-19 DIAGNOSIS — M6281 Muscle weakness (generalized): Secondary | ICD-10-CM

## 2020-07-19 DIAGNOSIS — M25551 Pain in right hip: Secondary | ICD-10-CM

## 2020-07-19 DIAGNOSIS — R252 Cramp and spasm: Secondary | ICD-10-CM | POA: Diagnosis not present

## 2020-07-19 NOTE — Therapy (Signed)
[facility] [address], [place], [place], [address] Phone: [phone]   Fax:  [phone]  Physical Therapy Treatment  Patient Details  Name: Nicole Barr MRN: [id number] Date of Birth: 27-Jan-1946 Referring Provider (PT): Lyndal Pulley, DO   Encounter Date: 07/19/2020   PT End of Session - 07/19/20 1014    Visit Number 9    Date for PT Re-Evaluation 09/07/20    Authorization Type Medicare/AARP    Progress Note Due on Visit 10    PT Start Time 0932    PT Stop Time 1011    PT Time Calculation (min) 39 min    Activity Tolerance Patient tolerated treatment well    Behavior During Therapy [facility] for tasks assessed/performed           Past Medical History:  Diagnosis Date  . Alcoholic cirrhosis ([place])   . Ascites   . Cirrhosis ([place])   . Endometriosis   . GERD (gastroesophageal reflux disease)   . Liver cyst     Past Surgical History:  Procedure Laterality Date  . CAROTID ENDARTERECTOMY Right 2008  . egd with banding  2016   with bleeding after wards done at [facility]  . ESOPHAGOGASTRODUODENOSCOPY (EGD) WITH PROPOFOL N/A 05/02/2015   Procedure: ESOPHAGOGASTRODUODENOSCOPY (EGD) WITH PROPOFOL;  Surgeon: Arta Silence, MD;  [facility];  Service: Endoscopy;  Laterality: N/A;  . ESOPHAGOGASTRODUODENOSCOPY (EGD) WITH PROPOFOL N/A 06/06/2015   Procedure: ESOPHAGOGASTRODUODENOSCOPY (EGD) WITH PROPOFOL;  Surgeon: Arta Silence, MD;  [facility];  Service: Endoscopy;  Laterality: N/A;  . GASTRIC VARICES BANDING N/A 05/02/2015   Procedure: GASTRIC VARICES BANDING;  Surgeon: Arta Silence, MD;  [facility];  Service: Endoscopy;  Laterality: N/A;  . HEMORROIDECTOMY    . LAMINECTOMY  1989   lumbar 4-5  . lapaendectomy  yrs ago  . LAPAROSCOPY  1989   adhesions  . oophrectomy Left 1989  . TONSILLECTOMY     age 83    There were no vitals filed for this visit.   Subjective  Assessment - 07/19/20 0938    Subjective Reports to today's session feeling improved as compared to last session.    Pertinent History Vit D deficiency, polyarthralgia, has stone bruise in Lt foot, lumbar laminectomy 1989 L4/5    How long can you sit comfortably? 10 min    How long can you stand comfortably? 2 hours    How long can you walk comfortably? 2 hours    Diagnostic tests xray Rt hip negative, xray lumbar chronic compression fx T11, degenerative changes mostly L1/2    Patient Stated Goals make sure hip is in alignment, keep from having to have a hip replacement    Currently in Pain? Yes    Pain Score 2     Pain Orientation Right    Pain Descriptors / Indicators Aching    Pain Type Chronic pain                             [facility] Adult PT Treatment/Exercise - 07/19/20 0001      Lumbar Exercises: Stretches   Double Knee to Chest Stretch 3 reps;10 seconds      Lumbar Exercises: Supine   Ab Set 10 reps    Pelvic Tilt 5 reps;5 seconds      Lumbar Exercises: Quadruped   Madcat/Old Horse 10 reps    Straight Leg Raise 10 reps  Straight Leg Raises Limitations 5 reps per LE; TC for decreased pelvic rotation      Knee/Hip Exercises: Aerobic   Nustep lvl 6 x 7 min; PT present to discuss patient status      Knee/Hip Exercises: Machines for Strengthening   Cybex Leg Press 2x10 repetitions; 85#      Knee/Hip Exercises: Standing   Other Standing Knee Exercises dead lift from 8" box; 10#; 2x10      Knee/Hip Exercises: Seated   Sit to Sand 10 reps   black loop     Knee/Hip Exercises: Supine   Hip Adduction Isometric Both;1 set;10 reps    Hip Adduction Isometric Limitations 5s hold                    PT Short Term Goals - 07/06/20 1056      PT SHORT TERM GOAL #1   Title Pt will be ind with initial HEP    Status On-going      PT SHORT TERM GOAL #2   Title Pt will report at least 30% less Rt hip pain with daily activities between PT visits  secondary to improved pelvic alignment.    Status Achieved      PT SHORT TERM GOAL #3   Title Pt will achieve Rt quad and QL length WNL    Status On-going             PT Long Term Goals - 07/17/20 1013      PT LONG TERM GOAL #3   Title Pt will achieve improved Rt hip strength of at least 4+/5 for hip flexion, abd, ext and ER to improve ability to maintain alignment of Rt hemipelvis for dynamic tasks.    Time 12    Period Weeks    Status Partially Met   hip ext and ER 4+/5                Plan - 07/19/20 1011    Clinical Impression Statement Patient continues to require verbal and tactile cuing for decreased pelvic rotation when performing quadruped hip extension exercise. Therapist noting increased thoracic kyphosis during dead lift activity with patient requiring visual demonstration for correction however patient unable to maintain indicating postural strength impairments. Would benefit from continued intervention to address pain for improved functional activity tolerance.    Personal Factors and Comorbidities Age;Time since onset of injury/illness/exacerbation;Comorbidity 1    Comorbidities Hx of lumbar laminectomy    Examination-Activity Limitations Transfers;Squat;Stairs;Sit;Bend    Examination-Participation Restrictions Community Activity;Shop;Cleaning;Laundry    Rehab Potential Good    PT Frequency 2x / week    PT Duration 12 weeks    PT Treatment/Interventions ADLs/Self Care Home Management;Electrical Stimulation;Cryotherapy;Moist Heat;Spinal Manipulations;Joint Manipulations;Stair training;Functional mobility training;Therapeutic activities;Therapeutic exercise;Balance training;Neuromuscular re-education;Manual techniques;Patient/family education;Dry needling;Taping    PT Next Visit Plan progress lumbar mobility; continue manual techniques for SI and lumbar; progress strength with functional component added as able    PT Home Exercise Plan Access Code: [email or web address]     Consulted and Agree with Plan of Care Patient           Patient will benefit from skilled therapeutic intervention in order to improve the following deficits and impairments:  Decreased range of motion,Postural dysfunction,Decreased strength,Decreased mobility,Improper body mechanics,Impaired flexibility,Decreased balance,Pain,Increased muscle spasms  Visit Diagnosis: Pain in right hip  Muscle weakness (generalized)  Cramp and spasm     Problem List Patient Active Problem List   Diagnosis Date Noted  . Nonrheumatic mitral  valve regurgitation 06/15/2020  . Aortic atherosclerosis ([place]) 06/15/2020  . Nonallopathic lesion of sacral region 06/08/2020  . Low back pain 01/26/2017  . Peripheral neuropathy 07/18/2016  . Loss of transverse plantar arch 07/18/2016  . Medication dosing interval changed 09/12/2015  . Increased abdominal girth 04/04/2015  . Coronary artery disease due to lipid rich plaque 04/04/2015  . Lung nodule 11/07/2014  . Altered blood in stool 09/27/2014  . ALC (alcoholic liver cirrhosis) ([place]) 07/26/2014  . Endometriosis 07/26/2014  . Bleeding esophageal varices ([place]) 07/26/2014  . Acute blood loss anemia 03/23/2014  . Below normal amount of sodium in the blood 03/23/2014  . Drug-induced hypokalemia 02/17/2014  . Alcohol abuse, in remission 02/08/2014  . Biliary cirrhosis ([place]) 02/08/2014  . Abdominal dropsy 02/02/2014  . H/O cardiovascular disorder 02/02/2014  . Breath shortness 02/02/2014  . Cirrhosis ([place]) 11/30/2013  . Clinical depression 07/23/2004  . Chronic nonalcoholic liver disease 77/37/5051  . Hypercholesterolemia without hypertriglyceridemia 07/23/2004    Everardo All PT, DPT  07/19/20 10:15 AM    [facility] [address], [place], [place], [address] Phone: [phone]   Fax:  [phone]  Name: Nicole Barr MRN: [id number] Date of Birth: 1945-12-22

## 2020-07-19 NOTE — Progress Notes (Signed)
[facility] [address] [place] Phone: [phone] Subjective:   I Nicole Barr am serving as a Education administrator for Dr. Hulan Saas.  This visit occurred during the SARS-CoV-2 public health emergency.  Safety protocols were in place, including screening questions prior to the visit, additional usage of staff PPE, and extensive cleaning of exam room while observing appropriate contact time as indicated for disinfecting solutions.   I'm seeing this patient by the request  of:  Gaynelle Arabian, MD  CC: hip and back pain follow up   [id number]   06/08/2020 Patient does have some low back pain.  We attempted some soft tissue and muscle energy technique with some very mild improvement.  I do believe some of the sacroiliac joint but patient does have many different comorbidities.  We will start formal physical therapy which I think will be beneficial.  Discussed with patient all recent lab work was from 2021 showed patient did have chronic kidney disease.  We will recheck at this time.  Encouraged her to follow-up with primary care provider.  No medications given to her today.  Follow-up with me again 6 to 8 weeks.  X-rays pending. Patient does have a significant number of comorbidities and will have a very low threshold for advanced imaging.  Patient does seems to be fairly active and going to the gym multiple times a week.  Update 07/24/2020 Nicole Barr is a 74 y.o. female coming in with complaint of right hip and low back pain. Patient states the exercises have helped. Would like to take a break from exercises. Decided she would like to take diet pills. Left foot pain in the metatarsal. States she has been using ice and has gotten new shoes.   06/07/2020 Lumbar spine IMPRESSION: 1. Chronic compression fracture of T11.  No acute fractures. 2. Degenerative changes as above. 3. Calcified atherosclerosis in the abdominal aorta.   06/07/2020 R hip  xray-negative Been in PT     Past Medical History:  Diagnosis Date  . Alcoholic cirrhosis ([place])   . Ascites   . Cirrhosis ([place])   . Endometriosis   . GERD (gastroesophageal reflux disease)   . Liver cyst    Past Surgical History:  Procedure Laterality Date  . CAROTID ENDARTERECTOMY Right 2008  . egd with banding  2016   with bleeding [facility] done [facility]  . ESOPHAGOGASTRODUODENOSCOPY (EGD) WITH PROPOFOL N/A 05/02/2015   Procedure: ESOPHAGOGASTRODUODENOSCOPY (EGD) WITH PROPOFOL;  Surgeon: Arta Silence, MD;  [facility];  Service: Endoscopy;  Laterality: N/A;  . ESOPHAGOGASTRODUODENOSCOPY (EGD) WITH PROPOFOL N/A 06/06/2015   Procedure: ESOPHAGOGASTRODUODENOSCOPY (EGD) WITH PROPOFOL;  Surgeon: Arta Silence, MD;  [facility];  Service: Endoscopy;  Laterality: N/A;  . GASTRIC VARICES BANDING N/A 05/02/2015   Procedure: GASTRIC VARICES BANDING;  Surgeon: Arta Silence, MD;  [facility];  Service: Endoscopy;  Laterality: N/A;  . HEMORROIDECTOMY    . LAMINECTOMY  1989   lumbar 4-5  . lapaendectomy  yrs ago  . LAPAROSCOPY  1989   adhesions  . oophrectomy Left 1989  . TONSILLECTOMY     age 62   Social History   Socioeconomic History  . Marital status: Single    Spouse name: Not on file  . Number of children: Not on file  . Years of education: Not on file  . Highest education level: Not on file  Occupational History  . Not on file  Tobacco Use  .  Smoking status: Former Smoker    Start date: 04/17/1968    Quit date: 04/17/1973    Years since quitting: 47.3  . Smokeless tobacco: Never Used  Substance and Sexual Activity  . Alcohol use: No    Alcohol/week: 10.0 standard drinks    Types: 10 Glasses of wine per week    Comment: currently states she is not drinking but states she usually had 2 glasses of wine per evening  . Drug use: No  . Sexual activity: Not on file  Other Topics Concern  . Not on file  Social History Narrative  . Not  on file   Social Determinants of Health   Financial Resource Strain: Not on file  Food Insecurity: Not on file  Transportation Needs: Not on file  Physical Activity: Not on file  Stress: Not on file  Social Connections: Not on file   Allergies  Allergen Reactions  . Carvedilol Other (See Comments)    Other reaction(s): Hypertension (intolerance), Rapid Heart Rate Increases BP to 300 - per pt   Family History  Problem Relation Age of Onset  . Diabetes Mother   . Cancer Mother   . Hypertension Mother   . Hyperlipidemia Mother   . Lupus Mother   . Vitiligo Mother   . Heart failure Father   . Heart failure Maternal Grandmother      Current Outpatient Medications (Cardiovascular):  .  furosemide (LASIX) 20 MG tablet, Take 20 mg by mouth 2 (two) times daily. .  nadolol (CORGARD) 20 MG tablet, Take 10 mg by mouth daily. Marland Kitchen  PRALUENT 75 MG/ML SOAJ, INJECT 75 MG INTO THE SKIN EVERY 14 (FOURTEEN) DAYS. Marland Kitchen  spironolactone (ALDACTONE) 50 MG tablet, Take 50 mg by mouth 2 (two) times daily.   Current Outpatient Medications (Analgesics):  .  aspirin 81 MG EC tablet, Take 81 mg by mouth 2 (two) times a week.   Current Outpatient Medications (Other):  .  gabapentin (NEURONTIN) 100 MG capsule, Take 2 capsules (200 mg total) by mouth at bedtime. Marland Kitchen  lactulose (CHRONULAC) 10 GM/15ML solution, 3-4 tablespoons daily by mouth .  Multiple Vitamins-Minerals (MULTIVITAMIN WITH MINERALS) tablet, Take 1 tablet by mouth once a week.  .  polyethylene glycol powder (GLYCOLAX/MIRALAX) 17 GM/SCOOP powder, Take by mouth. .  rifaximin (XIFAXAN) 550 MG TABS tablet, Take 550 mg by mouth 2 (two) times daily.   Reviewed prior external information including notes and imaging from  primary care provider As well as notes that were available from care everywhere and other healthcare systems.  Past medical history, social, surgical and family history all reviewed in electronic medical record.  No pertanent  information unless stated regarding to the chief complaint.   Review of Systems:  No headache, visual changes, nausea, vomiting, diarrhea, constipation, dizziness, abdominal pain, skin rash, fevers, chills, night sweats, weight loss, swollen lymph nodes joint swelling, chest pain, shortness of breath, mood changes. POSITIVE muscle aches, body aches  Objective  Blood pressure 110/70, pulse 64, height 5\' 6"  (1.676 m), weight 170 lb (77.1 kg), SpO2 98 %.   General: No apparent distress alert patient did have some mild tangential thought HEENT: Pupils equal, extraocular movements intact  Respiratory: Patient's speak in full sentences and does not appear short of breath  Cardiovascular: Trace lower extremity edema, non tender, no erythema  Gait normal with good balance and coordination.  MSK: Low back exam does have some very mild loss of lordosis.  Patient does have very mild tightness  noted at the piriformis noted on the left side.  Mild tenderness noted.  Foot exam does notice the patient does have significant breakdown of the transverse arch.  Neurovascularly intact with some very mild neuropathy noted but seems to be symmetric to the contralateral side.    Impression and Recommendations:     The above documentation has been reviewed and is accurate and complete Lyndal Pulley, DO

## 2020-07-24 ENCOUNTER — Other Ambulatory Visit: Payer: Self-pay

## 2020-07-24 ENCOUNTER — Ambulatory Visit (INDEPENDENT_AMBULATORY_CARE_PROVIDER_SITE_OTHER): Payer: Medicare Other | Admitting: Family Medicine

## 2020-07-24 ENCOUNTER — Encounter: Payer: Self-pay | Admitting: Family Medicine

## 2020-07-24 VITALS — BP 110/70 | HR 64 | Ht 66.0 in | Wt 170.0 lb

## 2020-07-24 DIAGNOSIS — E538 Deficiency of other specified B group vitamins: Secondary | ICD-10-CM | POA: Diagnosis not present

## 2020-07-24 DIAGNOSIS — M216X9 Other acquired deformities of unspecified foot: Secondary | ICD-10-CM

## 2020-07-24 DIAGNOSIS — I6523 Occlusion and stenosis of bilateral carotid arteries: Secondary | ICD-10-CM | POA: Diagnosis not present

## 2020-07-24 DIAGNOSIS — G621 Alcoholic polyneuropathy: Secondary | ICD-10-CM | POA: Diagnosis not present

## 2020-07-24 DIAGNOSIS — M2550 Pain in unspecified joint: Secondary | ICD-10-CM

## 2020-07-24 LAB — COMPREHENSIVE METABOLIC PANEL
ALT: 18 U/L (ref 0–35)
AST: 24 U/L (ref 0–37)
Albumin: 4.3 g/dL (ref 3.5–5.2)
Alkaline Phosphatase: 61 U/L (ref 39–117)
BUN: 17 mg/dL (ref 6–23)
CO2: 30 mEq/L (ref 19–32)
Calcium: 10.4 mg/dL (ref 8.4–10.5)
Chloride: 101 mEq/L (ref 96–112)
Creatinine, Ser: 1.1 mg/dL (ref 0.40–1.20)
GFR: 49.87 mL/min — ABNORMAL LOW (ref >60.00)
Glucose, Bld: 107 mg/dL — ABNORMAL HIGH (ref 70–99)
Potassium: 3.6 mEq/L (ref 3.5–5.1)
Sodium: 139 mEq/L (ref 135–145)
Total Bilirubin: 0.8 mg/dL (ref 0.2–1.2)
Total Protein: 7.3 g/dL (ref 6.0–8.3)

## 2020-07-24 LAB — CBC WITH DIFFERENTIAL/PLATELET
Basophils Absolute: 0 10*3/uL (ref 0.0–0.1)
Basophils Relative: 0.8 % (ref 0.0–3.0)
Eosinophils Absolute: 0.2 10*3/uL (ref 0.0–0.7)
Eosinophils Relative: 4 % (ref 0.0–5.0)
HCT: 39.4 % (ref 36.0–46.0)
Hemoglobin: 13.4 g/dL (ref 12.0–15.0)
Lymphocytes Relative: 25.9 % (ref 12.0–46.0)
Lymphs Abs: 1.2 10*3/uL (ref 0.7–4.0)
MCHC: 34 g/dL (ref 30.0–36.0)
MCV: 89.1 fl (ref 78.0–100.0)
Monocytes Absolute: 0.4 10*3/uL (ref 0.1–1.0)
Monocytes Relative: 8.6 % (ref 3.0–12.0)
Neutro Abs: 2.9 10*3/uL (ref 1.4–7.7)
Neutrophils Relative %: 60.7 % (ref 43.0–77.0)
Platelets: 207 10*3/uL (ref 150.0–400.0)
RBC: 4.42 Mil/uL (ref 3.87–5.11)
RDW: 12.7 % (ref 11.5–15.5)
WBC: 4.8 10*3/uL (ref 4.0–10.5)

## 2020-07-24 LAB — IBC PANEL
Iron: 97 ug/dL (ref 42–145)
Saturation Ratios: 28.5 % (ref 20.0–50.0)
Transferrin: 243 mg/dL (ref 212.0–360.0)

## 2020-07-24 LAB — FOLATE: Folate: 19.1 ng/mL (ref >5.9)

## 2020-07-24 LAB — FERRITIN: Ferritin: 46.2 ng/mL (ref 10.0–291.0)

## 2020-07-24 LAB — VITAMIN B12: Vitamin B-12: 335 pg/mL (ref 211–911)

## 2020-07-24 MED ORDER — GABAPENTIN 100 MG PO CAPS: 200.0000 mg | ORAL_CAPSULE | Freq: Every day | ORAL | 3 refills | Status: DC

## 2020-07-24 NOTE — Assessment & Plan Note (Signed)
Laboratory work-up ordered today.  Does have a history of cirrhosis that is likely contributing.  Given a very low dose of gabapentin to see how patient does.  GFR is greater than 45.  We will repeat labs today to further evaluate.  Follow-up with me again in 4 to 8 weeks

## 2020-07-24 NOTE — Patient Instructions (Addendum)
Good to see you Labs today Gabapentin 100 mg at night Can make you drowsy you can discontinue Keep doing exercises Try the metatarsal pad Dr. Jerilee Hoh at [facility] See me again in 6 weeks if not better we will consider checking labs again

## 2020-07-24 NOTE — Assessment & Plan Note (Signed)
Added metatarsal pad to see if patient would respond to this.  We discussed icing regimen and home exercises, discussed which activities to do which wants to avoid.  Increase activity slowly.  Follow-up again in 4 to 8 weeks

## 2020-07-25 ENCOUNTER — Other Ambulatory Visit: Payer: Self-pay

## 2020-07-25 ENCOUNTER — Ambulatory Visit: Payer: Medicare Other | Admitting: Physical Therapy

## 2020-07-25 DIAGNOSIS — R252 Cramp and spasm: Secondary | ICD-10-CM

## 2020-07-25 DIAGNOSIS — M25551 Pain in right hip: Secondary | ICD-10-CM

## 2020-07-25 DIAGNOSIS — M6281 Muscle weakness (generalized): Secondary | ICD-10-CM | POA: Diagnosis not present

## 2020-07-25 NOTE — Patient Instructions (Signed)
Quadruped Alternating Leg Extensions - 1 x daily - 7 x weekly - 2 sets - 8 reps Wall Squat - 1 x daily - 7 x weekly - 2 sets - 12 reps Supine Hip Flexion with Resistance Loop - 1 x daily - 7 x weekly - 3 sets - 12 reps

## 2020-07-25 NOTE — Therapy (Signed)
[facility] [address] [place], [place], [address] Phone: [phone]   Fax:  [phone]  Physical Therapy Treatment  Patient Details  Name: Nicole Barr MRN: [id number] Date of Birth: June 19, 1945 Referring Provider (PT): Lyndal Pulley, DO   Encounter Date: 07/25/2020   Progress Note Reporting Period 06/15/2020 to 07/25/2020  See note below for Objective Data and Assessment of Progress/Goals.        PT End of Session - 07/25/20 1051    Visit Number 10    Date for PT Re-Evaluation 09/07/20    Authorization Type Medicare/AARP    Progress Note Due on Visit 20    PT Start Time 0848    PT Stop Time 0930    PT Time Calculation (min) 42 min    Activity Tolerance Patient tolerated treatment well;No increased pain    Behavior During Therapy WFL for tasks assessed/performed           Past Medical History:  Diagnosis Date  . Alcoholic cirrhosis ([place])   . Ascites   . Cirrhosis ([place])   . Endometriosis   . GERD (gastroesophageal reflux disease)   . Liver cyst     Past Surgical History:  Procedure Laterality Date  . CAROTID ENDARTERECTOMY Right 2008  . egd with banding  2016   with bleeding [facility]  . ESOPHAGOGASTRODUODENOSCOPY (EGD) WITH PROPOFOL N/A 05/02/2015   Procedure: ESOPHAGOGASTRODUODENOSCOPY (EGD) WITH PROPOFOL;  Surgeon: Arta Silence, MD;  Location: [facility];  Service: Endoscopy;  Laterality: N/A;  . ESOPHAGOGASTRODUODENOSCOPY (EGD) WITH PROPOFOL N/A 06/06/2015   Procedure: ESOPHAGOGASTRODUODENOSCOPY (EGD) WITH PROPOFOL;  Surgeon: Arta Silence, MD;  Location: [facility];  Service: Endoscopy;  Laterality: N/A;  . GASTRIC VARICES BANDING N/A 05/02/2015   Procedure: GASTRIC VARICES BANDING;  Surgeon: Arta Silence, MD;  Location: [facility];  Service: Endoscopy;  Laterality: N/A;  . HEMORROIDECTOMY    . LAMINECTOMY  1989   lumbar 4-5  . lapaendectomy  yrs ago   . LAPAROSCOPY  1989   adhesions  . oophrectomy Left 1989  . TONSILLECTOMY     age 33    There were no vitals filed for this visit.   Subjective Assessment - 07/25/20 0902    Subjective Met with Dr. Tamala Julian yesterday. Has B12 and B6 deficiency.    Pertinent History Vit D deficiency, polyarthralgia, has stone bruise in Lt foot, lumbar laminectomy 1989 L4/5    How long can you sit comfortably? 60 minutes depending on surface    How long can you stand comfortably? 2 hours    How long can you walk comfortably? 2 hours    Diagnostic tests xray Rt hip negative, xray lumbar chronic compression fx T11, degenerative changes mostly L1/2    Patient Stated Goals make sure hip is in alignment, keep from having to have a hip replacement    Currently in Pain? Yes    Pain Score 1     Pain Location Hip    Pain Orientation Right    Pain Descriptors / Indicators Aching    Pain Type Chronic pain    Pain Onset More than a month ago    Pain Frequency Intermittent                             [facility] Adult PT Treatment/Exercise - 07/25/20 0001      Knee/Hip Exercises: Aerobic   Nustep lvl 6  x 10 min; PT present throughout to discuss current status      Knee/Hip Exercises: Machines for Strengthening   Cybex Leg Press 2x12 repetitions; 80#      Knee/Hip Exercises: Standing   Hip Flexion Both;2 sets;10 reps;Knee bent    Hip Flexion Limitations red loop on toes    Wall Squat Limitations with swiss ball; 2 x 12 repetitions                  PT Education - 07/25/20 0933    Education Details added quadruped hip extension, wall squat, supine hip flexion with red loop    Person(s) Educated Patient    Methods Explanation;Demonstration;Tactile cues;Verbal cues;Handout    Comprehension Verbalized understanding;Returned demonstration;Verbal cues required;Tactile cues required            PT Short Term Goals - 07/25/20 0901      PT SHORT TERM GOAL #1   Title Pt will be ind with  initial HEP    Time 3    Period Weeks    Status Achieved    Target Date 07/06/20      PT SHORT TERM GOAL #2   Title Pt will report at least 30% less Rt hip pain with daily activities between PT visits secondary to improved pelvic alignment.    Time 4    Period Weeks    Status Achieved    Target Date 07/13/20             PT Long Term Goals - 07/25/20 0906      PT LONG TERM GOAL #1   Title Pt will demo improved Rt SI joint and hip stability with stance phase of gait, squat and step ups to 6" riser without unlocking Rt SI joint.    Time 12    Period Weeks    Status On-going      PT LONG TERM GOAL #2   Title Pt will report improved Rt hip pain with daily activities by at least 70%    Time 12    Period Weeks    Status On-going   Patient reports 4-5/10 pain when performing daily activity which worsens in the evening     PT LONG TERM GOAL #3   Title Pt will achieve improved Rt hip strength of at least 4+/5 for hip flexion, abd, ext and ER to improve ability to maintain alignment of Rt hemipelvis for dynamic tasks.    Baseline hip ER and extension 4+/5    Time 12    Period Weeks    Status Partially Met      PT LONG TERM GOAL #4   Title Pt will improve FOTO score from 60% to 70% to demo improved function of Rt hip.    Baseline 60%    Time 12    Period Weeks    Status On-going   61%     PT LONG TERM GOAL #5   Title Pt will be ind with advanced HEP and understand how to safely progress.    Time 12    Period Weeks    Status On-going                 Plan - 07/25/20 1033    Clinical Impression Statement Patient is a 75 y/o female referred due to Rt hip pain. She demonstrates come Rt LE strength impairments as Rt hip ER and extension 4+/5. Continues to demonstrate Rt hip abduction and hip flexion strength deficits. Patient fluctuates  with reported pain however this date she reports minimally improved pain with daily activities. SI stability remains impaired as patient  is unable to consistently ascend/descend standard step without increased pain. Requiring verbal cues for eccentric control when performing standing resisted hip flexion this date. Would benefit from continued skilled therapeutic intervention to address impairments for decreased pain and improved functional activity tolerance.    Personal Factors and Comorbidities Age;Time since onset of injury/illness/exacerbation;Comorbidity 1    Comorbidities Hx of lumbar laminectomy    Examination-Activity Limitations Transfers;Squat;Stairs;Sit;Bend    Examination-Participation Restrictions Community Activity;Shop;Cleaning;Laundry    Stability/Clinical Decision Making Stable/Uncomplicated    Clinical Decision Making Low    Rehab Potential Good    PT Frequency 2x / week    PT Duration 12 weeks    PT Treatment/Interventions ADLs/Self Care Home Management;Electrical Stimulation;Cryotherapy;Moist Heat;Spinal Manipulations;Joint Manipulations;Stair training;Functional mobility training;Therapeutic activities;Therapeutic exercise;Balance training;Neuromuscular re-education;Manual techniques;Patient/family education;Dry needling;Taping    PT Next Visit Plan progress hip strengthening adding functional components when appropriate, continue lumbar mobility; manual techniques and modalities as needed    PT Home Exercise Plan Access Code: [email or web address]    Consulted and Agree with Plan of Care Patient           Patient will benefit from skilled therapeutic intervention in order to improve the following deficits and impairments:  Decreased range of motion,Postural dysfunction,Decreased strength,Decreased mobility,Improper body mechanics,Impaired flexibility,Decreased balance,Pain,Increased muscle spasms  Visit Diagnosis: Pain in right hip  Muscle weakness (generalized)  Cramp and spasm     Problem List Patient Active Problem List   Diagnosis Date Noted  . Nonrheumatic mitral valve regurgitation 06/15/2020  .  Aortic atherosclerosis ([place]) 06/15/2020  . Nonallopathic lesion of sacral region 06/08/2020  . Low back pain 01/26/2017  . Peripheral neuropathy 07/18/2016  . Loss of transverse plantar arch 07/18/2016  . Medication dosing interval changed 09/12/2015  . Increased abdominal girth 04/04/2015  . Coronary artery disease due to lipid rich plaque 04/04/2015  . Lung nodule 11/07/2014  . Altered blood in stool 09/27/2014  . ALC (alcoholic liver cirrhosis) ([place]) 07/26/2014  . Endometriosis 07/26/2014  . Bleeding esophageal varices ([place]) 07/26/2014  . Acute blood loss anemia 03/23/2014  . Below normal amount of sodium in the blood 03/23/2014  . Drug-induced hypokalemia 02/17/2014  . Alcohol abuse, in remission 02/08/2014  . Biliary cirrhosis ([place]) 02/08/2014  . Abdominal dropsy 02/02/2014  . H/O cardiovascular disorder 02/02/2014  . Breath shortness 02/02/2014  . Cirrhosis ([place]) 11/30/2013  . Clinical depression 07/23/2004  . Chronic nonalcoholic liver disease 98/92/1194  . Hypercholesterolemia without hypertriglyceridemia 07/23/2004   Everardo All PT, DPT  07/25/20 10:54 AM  [facility] [address] [place], [place], [address] Phone: [phone]   Fax:  [phone]  Name: Nicole Barr MRN: [id number] Date of Birth: 02/21/1946

## 2020-07-26 LAB — PROTEIN ELECTROPHORESIS, SERUM
Albumin ELP: 4.5 g/dL (ref 3.8–4.8)
Alpha 1: 0.3 g/dL (ref 0.2–0.3)
Alpha 2: 0.8 g/dL (ref 0.5–0.9)
Beta 2: 0.3 g/dL (ref 0.2–0.5)
Beta Globulin: 0.4 g/dL (ref 0.4–0.6)
Gamma Globulin: 0.8 g/dL (ref 0.8–1.7)
Total Protein: 7.1 g/dL (ref 6.1–8.1)

## 2020-07-30 ENCOUNTER — Encounter: Payer: Self-pay | Admitting: Physical Therapy

## 2020-07-30 ENCOUNTER — Ambulatory Visit: Payer: Medicare Other | Admitting: Physical Therapy

## 2020-07-30 ENCOUNTER — Other Ambulatory Visit: Payer: Self-pay

## 2020-07-30 DIAGNOSIS — R252 Cramp and spasm: Secondary | ICD-10-CM | POA: Diagnosis not present

## 2020-07-30 DIAGNOSIS — M25551 Pain in right hip: Secondary | ICD-10-CM

## 2020-07-30 DIAGNOSIS — M6281 Muscle weakness (generalized): Secondary | ICD-10-CM | POA: Diagnosis not present

## 2020-07-30 NOTE — Therapy (Signed)
[facility] [address] [place], [place], [address] Phone: [phone]   Fax:  [phone]  Physical Therapy Treatment  Patient Details  Name: Nicole Barr MRN: [id number] Date of Birth: [birth year] Referring Provider (PT): Lyndal Pulley, DO   Encounter Date: 07/30/2020   PT End of Session - 07/30/20 0843    Visit Number 11    Date for PT Re-Evaluation 09/07/20    Authorization Type Medicare/AARP    Progress Note Due on Visit 20    PT Start Time 0844    PT Stop Time 0926    PT Time Calculation (min) 42 min    Activity Tolerance Patient tolerated treatment well;No increased pain    Behavior During Therapy WFL for tasks assessed/performed           Past Medical History:  Diagnosis Date  . Alcoholic cirrhosis ([place])   . Ascites   . Cirrhosis ([place])   . Endometriosis   . GERD (gastroesophageal reflux disease)   . Liver cyst     Past Surgical History:  Procedure Laterality Date  . CAROTID ENDARTERECTOMY Right 2008  . egd with banding  2016   with bleeding [facility]  . ESOPHAGOGASTRODUODENOSCOPY (EGD) WITH PROPOFOL N/A 05/02/2015   Procedure: ESOPHAGOGASTRODUODENOSCOPY (EGD) WITH PROPOFOL;  Surgeon: Arta Silence, MD;  [facility];  Service: Endoscopy;  Laterality: N/A;  . ESOPHAGOGASTRODUODENOSCOPY (EGD) WITH PROPOFOL N/A 06/06/2015   Procedure: ESOPHAGOGASTRODUODENOSCOPY (EGD) WITH PROPOFOL;  Surgeon: Arta Silence, MD;  [facility];  Service: Endoscopy;  Laterality: N/A;  . GASTRIC VARICES BANDING N/A 05/02/2015   Procedure: GASTRIC VARICES BANDING;  Surgeon: Arta Silence, MD;  [facility];  Service: Endoscopy;  Laterality: N/A;  . HEMORROIDECTOMY    . LAMINECTOMY  1989   lumbar 4-5  . lapaendectomy  yrs ago  . LAPAROSCOPY  1989   adhesions  . oophrectomy Left 1989  . TONSILLECTOMY     age 75    There were no vitals filed for this  visit.   Subjective Assessment - 07/30/20 0843    Subjective I am taking the Gabapentin from Dr. Tamala Julian and it has really helped.  I continue to go to the gym.  I want to try on my own after we finish out our sessions.    Pertinent History Vit D deficiency, polyarthralgia, has stone bruise in Lt foot, lumbar laminectomy 1989 L4/5    How long can you sit comfortably? 60 minutes depending on surface    How long can you stand comfortably? 2 hours    How long can you walk comfortably? 2 hours    Diagnostic tests xray Rt hip negative, xray lumbar chronic compression fx T11, degenerative changes mostly L1/2    Patient Stated Goals make sure hip is in alignment, keep from having to have a hip replacement                             [facility] Adult PT Treatment/Exercise - 07/30/20 0001      Exercises   Exercises Lumbar;Knee/Hip      Lumbar Exercises: Aerobic   Recumbent Bike L3 x 8' PT present to discuss progress      Lumbar Exercises: Standing   Other Standing Lumbar Exercises wall push ups x 15 hands on green swiss ball      Lumbar Exercises: Supine   Bridge Compliant;10 reps  Bridge Limitations tried march in bridge but hamstring cramps    Other Supine Lumbar Exercises supine 90/90 red loop around toes leg press, PT cued slower with ab and contralateral hip flexor isom for core stability x 10 each      Lumbar Exercises: Sidelying   Hip Abduction Right;10 reps;Left   10 pulses at top range after 10 full ROM     Lumbar Exercises: Quadruped   Straight Leg Raise 10 reps    Straight Leg Raises Limitations 2x5    Other Quadruped Lumbar Exercises bear plank 3x10 sec      Knee/Hip Exercises: Standing   Hip Flexion Both;2 sets;10 reps;Knee bent    Hip Flexion Limitations red loop on toes    Forward Step Up Both;1 set;15 reps;Hand Hold: 1;Step Height: 6"    Forward Step Up Limitations with contralateral march to [place] Limitations with swiss ball; 2 x 12  repetitions   add 3lb bil UE flexion to 90 deg with squat                   PT Short Term Goals - 07/25/20 0901      PT SHORT TERM GOAL #1   Title Pt will be ind with initial HEP    Time 3    Period Weeks    Status Achieved    Target Date 07/06/20      PT SHORT TERM GOAL #2   Title Pt will report at least 30% less Rt hip pain with daily activities between PT visits secondary to improved pelvic alignment.    Time 4    Period Weeks    Status Achieved    Target Date 07/13/20             PT Long Term Goals - 07/25/20 0906      PT LONG TERM GOAL #1   Title Pt will demo improved Rt SI joint and hip stability with stance phase of gait, squat and step ups to 6" riser without unlocking Rt SI joint.    Time 12    Period Weeks    Status On-going      PT LONG TERM GOAL #2   Title Pt will report improved Rt hip pain with daily activities by at least 70%    Time 12    Period Weeks    Status On-going   Patient reports 4-5/10 pain when performing daily activity which worsens in the evening     PT LONG TERM GOAL #3   Title Pt will achieve improved Rt hip strength of at least 4+/5 for hip flexion, abd, ext and ER to improve ability to maintain alignment of Rt hemipelvis for dynamic tasks.    Baseline hip ER and extension 4+/5    Time 12    Period Weeks    Status Partially Met      PT LONG TERM GOAL #4   Title Pt will improve FOTO score from 60% to 70% to demo improved function of Rt hip.    Baseline 60%    Time 12    Period Weeks    Status On-going   61%     PT LONG TERM GOAL #5   Title Pt will be ind with advanced HEP and understand how to safely progress.    Time 12    Period Weeks    Status On-going  Plan - 07/30/20 9476    Clinical Impression Statement Pt reports signif improvement in pain since starting Gabapentin.  Arrived with pain 1/10 at most.  She feels she will be ready to discharge to HEP next session.  Referring MD suggested  trying to go without PT and focusing on HEP and Pt is optimistic and motivated with this plan.  She demos improving awareness of Rt hip muscle activation in both open and closed chain.  PT reviewed new exercises from last visit with need for min cueing for eccentric control on standing resisted march.  PT added UE dumbbell raises with wall squat today with good tolerance.  PT progressed quadruped exercises to include bear plank which Pt liked and will add to HEP.  Finalize HEP next visit with anticipated d/c at that time.    Comorbidities Hx of lumbar laminectomy    PT Frequency 2x / week    PT Duration 12 weeks    PT Treatment/Interventions ADLs/Self Care Home Management;Electrical Stimulation;Cryotherapy;Moist Heat;Spinal Manipulations;Joint Manipulations;Stair training;Functional mobility training;Therapeutic activities;Therapeutic exercise;Balance training;Neuromuscular re-education;Manual techniques;Patient/family education;Dry needling;Taping    PT Next Visit Plan finalize HEP and d/c next visit    PT Home Exercise Plan Access Code: [email or web address]    Consulted and Agree with Plan of Care Patient           Patient will benefit from skilled therapeutic intervention in order to improve the following deficits and impairments:     Visit Diagnosis: Pain in right hip  Muscle weakness (generalized)  Cramp and spasm     Problem List Patient Active Problem List   Diagnosis Date Noted  . Nonrheumatic mitral valve regurgitation 06/15/2020  . Aortic atherosclerosis ([place]) 06/15/2020  . Nonallopathic lesion of sacral region 06/08/2020  . Low back pain 01/26/2017  . Peripheral neuropathy 07/18/2016  . Loss of transverse plantar arch 07/18/2016  . Medication dosing interval changed 09/12/2015  . Increased abdominal girth 04/04/2015  . Coronary artery disease due to lipid rich plaque 04/04/2015  . Lung nodule 11/07/2014  . Altered blood in stool 09/27/2014  . ALC (alcoholic liver cirrhosis)  ([place]) 07/26/2014  . Endometriosis 07/26/2014  . Bleeding esophageal varices ([place]) 07/26/2014  . Acute blood loss anemia 03/23/2014  . Below normal amount of sodium in the blood 03/23/2014  . Drug-induced hypokalemia 02/17/2014  . Alcohol abuse, in remission 02/08/2014  . Biliary cirrhosis ([place]) 02/08/2014  . Abdominal dropsy 02/02/2014  . H/O cardiovascular disorder 02/02/2014  . Breath shortness 02/02/2014  . Cirrhosis ([place]) 11/30/2013  . Clinical depression 07/23/2004  . Chronic nonalcoholic liver disease 54/65/0354  . Hypercholesterolemia without hypertriglyceridemia 07/23/2004    Baruch Merl, PT 07/30/20 9:27 AM   [facility] [address] [place], [place], [address] Phone: [phone]   Fax:  [phone]  Name: Nicole Barr MRN: [id number] Date of Birth: 20-Apr-1945

## 2020-07-31 ENCOUNTER — Other Ambulatory Visit: Payer: Self-pay | Admitting: Family Medicine

## 2020-07-31 DIAGNOSIS — Z1231 Encounter for screening mammogram for malignant neoplasm of breast: Secondary | ICD-10-CM

## 2020-08-01 ENCOUNTER — Encounter: Payer: Self-pay | Admitting: Physical Therapy

## 2020-08-01 ENCOUNTER — Ambulatory Visit: Payer: Medicare Other | Admitting: Physical Therapy

## 2020-08-01 ENCOUNTER — Other Ambulatory Visit: Payer: Self-pay

## 2020-08-01 DIAGNOSIS — M6281 Muscle weakness (generalized): Secondary | ICD-10-CM

## 2020-08-01 DIAGNOSIS — M25551 Pain in right hip: Secondary | ICD-10-CM

## 2020-08-01 DIAGNOSIS — R252 Cramp and spasm: Secondary | ICD-10-CM | POA: Diagnosis not present

## 2020-08-01 NOTE — Therapy (Signed)
[facility] [address] [place], [place], [address] Phone: [phone]   Fax:  [phone]  Physical Therapy Treatment  Patient Details  Name: Nicole Barr MRN: [id number] Date of Birth: June 15, 1945 Referring Provider (PT): Lyndal Pulley, DO   Encounter Date: 08/01/2020   PT End of Session - 08/01/20 0755    Visit Number 12    Date for PT Re-Evaluation 09/07/20    Authorization Type Medicare/[employer]ARP    Progress Note Due on Visit 20    PT Start Time 1657    PT Stop Time 0839    PT Time Calculation (min) 42 min    Activity Tolerance Patient tolerated treatment well;No increased pain    Behavior During Therapy WFL for tasks assessed/performed           Past Medical History:  Diagnosis Date  . Alcoholic cirrhosis ([place])   . Ascites   . Cirrhosis ([place])   . Endometriosis   . GERD (gastroesophageal reflux disease)   . Liver cyst     Past Surgical History:  Procedure Laterality Date  . CAROTID ENDARTERECTOMY Right 2008  . egd with banding  2016   with bleeding [facility]  . ESOPHAGOGASTRODUODENOSCOPY (EGD) WITH PROPOFOL N/A 05/02/2015   Procedure: ESOPHAGOGASTRODUODENOSCOPY (EGD) WITH PROPOFOL;  Surgeon: Arta Silence, MD;  Location: [facility];  Service: Endoscopy;  Laterality: N/A;  . ESOPHAGOGASTRODUODENOSCOPY (EGD) WITH PROPOFOL N/A 06/06/2015   Procedure: ESOPHAGOGASTRODUODENOSCOPY (EGD) WITH PROPOFOL;  Surgeon: Arta Silence, MD;  Location: [facility];  Service: Endoscopy;  Laterality: N/A;  . GASTRIC VARICES BANDING N/A 05/02/2015   Procedure: GASTRIC VARICES BANDING;  Surgeon: Arta Silence, MD;  Location: [facility];  Service: Endoscopy;  Laterality: N/A;  . HEMORROIDECTOMY    . LAMINECTOMY  1989   lumbar 4-5  . lapaendectomy  yrs ago  . LAPAROSCOPY  1989   adhesions  . oophrectomy Left 1989  . TONSILLECTOMY     age 75    There were no vitals filed for this  visit.   Subjective Assessment - 08/01/20 0755    Subjective I am nearly 100% improved in my Rt hip.  I am ready to try on my own.  I am just stiff this morning.    Pertinent History Vit D deficiency, polyarthralgia, has stone bruise in Lt foot, lumbar laminectomy 1989 L4/5    How long can you sit comfortably? 60 minutes depending on surface    How long can you stand comfortably? 2 hours    How long can you walk comfortably? 2 hours    Diagnostic tests xray Rt hip negative, xray lumbar chronic compression fx T11, degenerative changes mostly L1/2    Patient Stated Goals make sure hip is in alignment, keep from having to have a hip replacement    Currently in Pain? No/denies              [facility] PT Assessment - 08/01/20 0001      Assessment   Medical Diagnosis M25.551 (ICD-10-CM) - Right hip pain    Referring Provider (PT) Lyndal Pulley, DO    Next MD Visit unsure of date    Prior Therapy no      Observation/Other Assessments   Focus on Therapeutic Outcomes (FOTO)  61%      Squat   Comments improved SI stability with squat, no unlocking      Single Leg Stance   Comments improved balance and stability in  Rt SI joint Rt LE SLS      AROM   Overall AROM Comments trunk and hips WFL      Strength   Overall Strength Comments Rt hip 4+/5 with expection of hip abd/ER 4/5                         [facility] Adult PT Treatment/Exercise - 08/01/20 0001      Lumbar Exercises: Aerobic   Nustep L6 x 10' PT present to review goals      Lumbar Exercises: Standing   Other Standing Lumbar Exercises wall push ups x 3x10 hands on green swiss ball    Other Standing Lumbar Exercises wall plank hands on green ball 3x10sec TC for serratus press into ball      Lumbar Exercises: Supine   Other Supine Lumbar Exercises supine 90/90 red loop around toes leg press, PT cued slower with ab and contralateral hip flexor isom for core stability x 20 each      Lumbar Exercises: Quadruped    Straight Leg Raise 5 reps    Straight Leg Raises Limitations 3x5" holds on elbows    Other Quadruped Lumbar Exercises bear plank 5x5" holds      Knee/Hip Exercises: Stretches   Piriformis Stretch 1 rep;20 seconds;Right      Knee/Hip Exercises: Standing   Lateral Step Up 1 set;10 reps;Step Height: 6";Hand Hold: 1;Both    Forward Step Up Both;10 reps;Step Height: 6";Hand Hold: 2    Forward Step Up Limitations with contralateral march to 4th step, light UE support bil      Knee/Hip Exercises: Seated   Marching Strengthening;20 reps;2 sets;Weights    Marching Limitations holding 8lb dumbbells on thighs, VC for slower march for trunk control    Sit to Sand 2 sets;10 reps;without UE support;Other (comment)   holding 10lb plate     Knee/Hip Exercises: Sidelying   Hip ABduction Strengthening;Right;3 sets;10 reps;Left    Hip ABduction Limitations TC and VC for TA and avoidance of LE ER during abduction                    PT Short Term Goals - 07/25/20 0901      PT SHORT TERM GOAL #1   Title Pt will be ind with initial HEP    Time 3    Period Weeks    Status Achieved    Target Date 07/06/20      PT SHORT TERM GOAL #2   Title Pt will report at least 30% less Rt hip pain with daily activities between PT visits secondary to improved pelvic alignment.    Time 4    Period Weeks    Status Achieved    Target Date 07/13/20             PT Long Term Goals - 08/01/20 0803      PT LONG TERM GOAL #1   Title Pt will demo improved Rt SI joint and hip stability with stance phase of gait, squat and step ups to 6" riser without unlocking Rt SI joint.    Status Achieved      PT LONG TERM GOAL #2   Title Pt will report improved Rt hip pain with daily activities by at least 70%    Baseline nearly 100% improved per Pt report    Status Achieved      PT LONG TERM GOAL #3   Title Pt will achieve improved  Rt hip strength of at least 4+/5 for hip flexion, abd, ext and ER to improve  ability to maintain alignment of Rt hemipelvis for dynamic tasks.    Baseline hip abd and ER 4/5, ext 4+/5    Status Partially Met      PT LONG TERM GOAL #4   Title Pt will improve FOTO score from 60% to 70% to demo improved function of Rt hip.    Baseline 61%    Status Partially Met      PT LONG TERM GOAL #5   Title Pt will be ind with advanced HEP and understand how to safely progress.    Status Achieved                 Plan - 08/01/20 0804    Clinical Impression Statement Pt and referring MD met last week and Pt added Gabapentin and supplements to support vitamin deficiencies.  Pt feels nearly 100% improved with addition of new meds to her HEP.  She has met or partially met all LTGs.  She is very motivated and will easily transition her HEP to her gym.  She has improved and ongoing improvement in all muscle groups surrounding Rt hip.  She has improved SI joint and hip stability in closed chain on Rt side in squat, stairs, gait and SLS.  Rt hip strength ranges from 4/5 to 4+/5 and will continue to improve with HEP.  She needed ongoing VCs to slow reps down for improved trunk control/focus.  FOTO score improved by only 1 point but Pt seems more improved than this reflects.  She is very pleased with her progress and is ready to d/c to HEP at this time.    Comorbidities Hx of lumbar laminectomy    PT Frequency 2x / week    PT Duration 12 weeks    PT Treatment/Interventions ADLs/Self Care Home Management;Electrical Stimulation;Cryotherapy;Moist Heat;Spinal Manipulations;Joint Manipulations;Stair training;Functional mobility training;Therapeutic activities;Therapeutic exercise;Balance training;Neuromuscular re-education;Manual techniques;Patient/family education;Dry needling;Taping    PT Next Visit Plan d/c to HEP    PT Home Exercise Plan Access Code: [email or web address]    Consulted and Agree with Plan of Care Patient           Patient will benefit from skilled therapeutic intervention in  order to improve the following deficits and impairments:     Visit Diagnosis: Pain in right hip  Muscle weakness (generalized)     Problem List Patient Active Problem List   Diagnosis Date Noted  . Nonrheumatic mitral valve regurgitation 06/15/2020  . Aortic atherosclerosis ([place]) 06/15/2020  . Nonallopathic lesion of sacral region 06/08/2020  . Low back pain 01/26/2017  . Peripheral neuropathy 07/18/2016  . Loss of transverse plantar arch 07/18/2016  . Medication dosing interval changed 09/12/2015  . Increased abdominal girth 04/04/2015  . Coronary artery disease due to lipid rich plaque 04/04/2015  . Lung nodule 11/07/2014  . Altered blood in stool 09/27/2014  . ALC (alcoholic liver cirrhosis) ([place]) 07/26/2014  . Endometriosis 07/26/2014  . Bleeding esophageal varices ([place]) 07/26/2014  . Acute blood loss anemia 03/23/2014  . Below normal amount of sodium in the blood 03/23/2014  . Drug-induced hypokalemia 02/17/2014  . Alcohol abuse, in remission 02/08/2014  . Biliary cirrhosis ([place]) 02/08/2014  . Abdominal dropsy 02/02/2014  . H/O cardiovascular disorder 02/02/2014  . Breath shortness 02/02/2014  . Cirrhosis ([place]) 11/30/2013  . Clinical depression 07/23/2004  . Chronic nonalcoholic liver disease 82/70/7867  . Hypercholesterolemia without hypertriglyceridemia 07/23/2004    PHYSICAL  THERAPY DISCHARGE SUMMARY  Visits from Start of Care: 12  Current functional level related to goals / functional outcomes: See above   Remaining deficits: See above   Education / Equipment: HEP Plan: Patient agrees to discharge.  Patient goals were partially met. Patient is being discharged due to being pleased with the current functional level.  ?????         Baruch Merl, PT 08/01/20 8:40 AM   [facility] [address] [place], [place], [address] Phone: [phone]   Fax:  [phone]  Name: ALYLA PIETILA MRN: [id number] Date of Birth: May 10, 1945

## 2020-08-30 ENCOUNTER — Other Ambulatory Visit: Payer: Self-pay | Admitting: Pharmacist

## 2020-08-30 DIAGNOSIS — I2510 Atherosclerotic heart disease of native coronary artery without angina pectoris: Secondary | ICD-10-CM

## 2020-08-30 DIAGNOSIS — I2583 Coronary atherosclerosis due to lipid rich plaque: Secondary | ICD-10-CM

## 2020-08-30 MED ORDER — PRALUENT 75 MG/ML ~~LOC~~ SOAJ: 75.0000 mg | SUBCUTANEOUS | 3 refills | Status: DC

## 2020-08-30 NOTE — Telephone Encounter (Signed)
Called [facility] regarding grant info.  Had [employer] grant on file.

## 2020-08-30 NOTE — Telephone Encounter (Signed)
Patient called to request Praluent refill.  Sent to pharmacy and scheduled repeat lipid panel for July

## 2020-08-31 NOTE — Progress Notes (Signed)
[facility] [address] [place] Phone: [phone] Subjective:   I Nicole Barr am serving as a Education administrator for Dr. Hulan Saas.  This visit occurred during the SARS-CoV-2 public health emergency.  Safety protocols were in place, including screening questions prior to the visit, additional usage of staff PPE, and extensive cleaning of exam room while observing appropriate contact time as indicated for disinfecting solutions.   I'm seeing this patient by the request  of:  Gaynelle Arabian, MD  CC: Right hip and low back pain follow-up  YKZ:[id number]   07/24/2020 Added metatarsal pad to see if patient would respond to this.  We discussed icing regimen and home exercises, discussed which activities to do which wants to avoid.  Increase activity slowly.  Follow-up again in 4 to 8 weeks  Laboratory work-up ordered today.  Does have a history of cirrhosis that is likely contributing.  Given a very low dose of gabapentin to see how patient does.  GFR is greater than 45.  We will repeat labs today to further evaluate.  Follow-up with me again in 4 to 8 weeks  Update 09/04/2020 Nicole Barr is a 75 y.o. female coming in with complaint of R hip and LBP. Patient states she is having a tough time and has some questions today. Has tried new shoes. Wants to know where the compression fractures were and that it may have something to do with her issue. States she has been having issues with extended walking.        Past Medical History:  Diagnosis Date  . Alcoholic cirrhosis ([place])   . Ascites   . Cirrhosis ([place])   . Endometriosis   . GERD (gastroesophageal reflux disease)   . Liver cyst    Past Surgical History:  Procedure Laterality Date  . CAROTID ENDARTERECTOMY Right 2008  . egd with banding  2016   with bleeding after wards done at [facility]  . ESOPHAGOGASTRODUODENOSCOPY (EGD) WITH PROPOFOL N/A 05/02/2015   Procedure:  ESOPHAGOGASTRODUODENOSCOPY (EGD) WITH PROPOFOL;  Surgeon: Arta Silence, MD;  [facility]: [facility];  Service: Endoscopy;  Laterality: N/A;  . ESOPHAGOGASTRODUODENOSCOPY (EGD) WITH PROPOFOL N/A 06/06/2015   Procedure: ESOPHAGOGASTRODUODENOSCOPY (EGD) WITH PROPOFOL;  Surgeon: Arta Silence, MD;  [facility]: [facility];  Service: Endoscopy;  Laterality: N/A;  . GASTRIC VARICES BANDING N/A 05/02/2015   Procedure: GASTRIC VARICES BANDING;  Surgeon: Arta Silence, MD;  Location: [facility];  Service: Endoscopy;  Laterality: N/A;  . HEMORROIDECTOMY    . LAMINECTOMY  1989   lumbar 4-5  . lapaendectomy  yrs ago  . LAPAROSCOPY  1989   adhesions  . oophrectomy Left 1989  . TONSILLECTOMY     age 51   Social History   Socioeconomic History  . Marital status: Single    Spouse name: Not on file  . Number of children: Not on file  . Years of education: Not on file  . Highest education level: Not on file  Occupational History  . Not on file  Tobacco Use  . Smoking status: Former Smoker    Start date: 04/17/1968    Quit date: 04/17/1973    Years since quitting: 47.4  . Smokeless tobacco: Never Used  Substance and Sexual Activity  . Alcohol use: No    Alcohol/week: 10.0 standard drinks    Types: 10 Glasses of wine per week    Comment: currently states she is not drinking but states she usually had 2 glasses of  wine per evening  . Drug use: No  . Sexual activity: Not on file  Other Topics Concern  . Not on file  Social History Narrative  . Not on file   Social Determinants of Health   Financial Resource Strain: Not on file  Food Insecurity: Not on file  Transportation Needs: Not on file  Physical Activity: Not on file  Stress: Not on file  Social Connections: Not on file   Allergies  Allergen Reactions  . Carvedilol Other (See Comments)    Other reaction(s): Hypertension (intolerance), Rapid Heart Rate Increases BP to 300 - per pt   Family History  Problem Relation Age of  Onset  . Diabetes Mother   . Cancer Mother   . Hypertension Mother   . Hyperlipidemia Mother   . Lupus Mother   . Vitiligo Mother   . Heart failure Father   . Heart failure Maternal Grandmother      Current Outpatient Medications (Cardiovascular):  Marland Kitchen  Alirocumab (PRALUENT) 75 MG/ML SOAJ, Inject 75 mg into the skin every 14 (fourteen) days. .  furosemide (LASIX) 20 MG tablet, Take 20 mg by mouth 2 (two) times daily. .  nadolol (CORGARD) 20 MG tablet, Take 10 mg by mouth daily. Marland Kitchen  spironolactone (ALDACTONE) 50 MG tablet, Take 50 mg by mouth 2 (two) times daily.   Current Outpatient Medications (Analgesics):  .  aspirin 81 MG EC tablet, Take 81 mg by mouth 2 (two) times a week.   Current Outpatient Medications (Other):  .  gabapentin (NEURONTIN) 100 MG capsule, Take 2 capsules (200 mg total) by mouth at bedtime. Marland Kitchen  lactulose (CHRONULAC) 10 GM/15ML solution, 3-4 tablespoons daily by mouth .  Multiple Vitamins-Minerals (MULTIVITAMIN WITH MINERALS) tablet, Take 1 tablet by mouth once a week.  .  polyethylene glycol powder (GLYCOLAX/MIRALAX) 17 GM/SCOOP powder, Take by mouth. .  rifaximin (XIFAXAN) 550 MG TABS tablet, Take 550 mg by mouth 2 (two) times daily.   Reviewed prior external information including notes and imaging from  primary care provider As well as notes that were available from care everywhere and other healthcare systems.  Past medical history, social, surgical and family history all reviewed in electronic medical record.  No pertanent information unless stated regarding to the chief complaint.   Review of Systems:  No headache, visual changes, nausea, vomiting, diarrhea, constipation, dizziness, abdominal pain, skin rash, fevers, chills, night sweats, weight loss, swollen lymph nodes,  joint swelling, chest pain, shortness of breath, mood changes. POSITIVE muscle aches, body aches  Objective  Blood pressure 122/70, pulse 65, height 5\' 6"  (1.676 m), weight 168 lb  (76.2 kg), SpO2 98 %.   General: No apparent distress alert and oriented x3 mood and affect normal, dressed appropriately.  HEENT: Pupils equal, extraocular movements intact  Respiratory: Patient's speak in full sentences and does not appear short of breath  Cardiovascular: No lower extremity edema, non tender, no erythema  Antalgic gait noted favoring the right knee. MSK: Right hip and lower back pain.  Tender to palpation in the paraspinal musculature in the lumbar spine right greater than left. Pain severely over the right greater trochanteric area.  Patient does still have pain over the right sacroiliac joint noted as well.  Worsening pain in the back with any extension greater than 10 degrees.  Neurovascular intact distally.  After verbal consent patient was prepped with alcohol swab and with a 21-gauge 2 inch needle injected into the right greater trochanteric area with a total  of 1 cc of 0.5% Marcaine and 1 cc of Kenalog 40 mg/mL.  No blood loss.  Band-Aid placed.  Postinjection instructions given   Impression and Recommendations:     The above documentation has been reviewed and is accurate and complete Lyndal Pulley, DO

## 2020-09-04 ENCOUNTER — Encounter: Payer: Self-pay | Admitting: Family Medicine

## 2020-09-04 ENCOUNTER — Ambulatory Visit (INDEPENDENT_AMBULATORY_CARE_PROVIDER_SITE_OTHER): Payer: Medicare Other | Admitting: Family Medicine

## 2020-09-04 ENCOUNTER — Other Ambulatory Visit: Payer: Self-pay

## 2020-09-04 DIAGNOSIS — I6523 Occlusion and stenosis of bilateral carotid arteries: Secondary | ICD-10-CM

## 2020-09-04 DIAGNOSIS — M5442 Lumbago with sciatica, left side: Secondary | ICD-10-CM | POA: Diagnosis not present

## 2020-09-04 DIAGNOSIS — G8929 Other chronic pain: Secondary | ICD-10-CM

## 2020-09-04 DIAGNOSIS — M5441 Lumbago with sciatica, right side: Secondary | ICD-10-CM | POA: Diagnosis not present

## 2020-09-04 DIAGNOSIS — M7061 Trochanteric bursitis, right hip: Secondary | ICD-10-CM | POA: Diagnosis not present

## 2020-09-04 NOTE — Patient Instructions (Addendum)
Good to see you Injection in the hip today  Exercises for the hip Ice and voltaren for the side of the hip Ok to walk just stretch after See me again in 2 months. IF in 3 weeks not better we will get MRI

## 2020-09-04 NOTE — Assessment & Plan Note (Signed)
Still believe the patient's low back pain could be more secondary to the amount of degenerative disc disease.  Patient is having peripheral neuropathy for some follow-up as well as a history of alcohol abuse.  Patient denies being very active and encouraged her to continue to do so.  Tried a possible empiric trochanteric injection today to see if it will be helpful.  Continue the gabapentin.  Follow-up with me again 2 months otherwise.  Worsening pain consider MRI of the lumbar spine.

## 2020-09-04 NOTE — Assessment & Plan Note (Signed)
Patient was having pain that seem to be localized to the right hip.  Given injection today and tolerated the procedure well.  Discussed icing regimen and home exercises.  Discussed the differential be more of a lumbar radiculopathy.  Increase activity slowly.  Follow-up again in 6 to 8 weeks at that time likely repeat osteopathic manipulation.

## 2020-09-19 ENCOUNTER — Ambulatory Visit
Admission: RE | Admit: 2020-09-19 | Discharge: 2020-09-19 | Disposition: A | Discharge status: Home / Self Care | Payer: Medicare Other | Source: Ambulatory Visit | Attending: Family Medicine | Admitting: Family Medicine

## 2020-09-19 ENCOUNTER — Other Ambulatory Visit: Payer: Self-pay

## 2020-09-19 DIAGNOSIS — Z1231 Encounter for screening mammogram for malignant neoplasm of breast: Secondary | ICD-10-CM

## 2020-11-01 NOTE — Progress Notes (Signed)
[place] Phone: [phone] Subjective:   [name] No, am serving as a scribe for Dr. Hulan Saas.  This visit occurred during the SARS-CoV-2 public health emergency.  Safety protocols were in place, including screening questions prior to the visit, additional usage of staff PPE, and extensive cleaning of exam room while observing appropriate contact time as indicated for disinfecting solutions.   I'm seeing this patient by the request  of:  Gaynelle Arabian, MD  CC: Low back and hip pain follow-up  [id number]  09/04/2020 Still believe the patient's low back pain could be more secondary to the amount of degenerative disc disease.  Patient is having peripheral neuropathy for some follow-up as well as a history of alcohol abuse.  Patient denies being very active and encouraged her to continue to do so.  Tried a possible empiric trochanteric injection today to see if it will be helpful.  Continue the gabapentin.  Follow-up with me again 2 months otherwise.  Worsening pain consider MRI of the lumbar spine.  Update 11/06/2020 KEYDRA OSORNO is a 75 y.o. female coming in with complaint of low back pain. Had injection in hip last visit.  This was in the greater trochanteric area.  Addition of this was to start gabapentin.  Patient states that she is unable to do 2 miles without pain.  Patient is trying to do different [facility] recently as well.  States that it seems that she can do relatively well for some time but then starts having increasing discomfort again.  Patient is concerned some of it is from her fluid and feels like her diuretic is not helpful.  Patient is following up with other providers.  Denies any shortness of breath at the moment.        Past Medical History:  Diagnosis Date   Alcoholic cirrhosis ([place])    Ascites    Cirrhosis ([place])    Endometriosis    GERD (gastroesophageal reflux disease)     Liver cyst    Past Surgical History:  Procedure Laterality Date   CAROTID ENDARTERECTOMY Right 2008   egd with banding  2016   with bleeding [facility]   ESOPHAGOGASTRODUODENOSCOPY (EGD) WITH PROPOFOL N/A 05/02/2015   Procedure: ESOPHAGOGASTRODUODENOSCOPY (EGD) WITH PROPOFOL;  Surgeon: Arta Silence, MD;  Location: [facility];  Service: Endoscopy;  Laterality: N/A;   ESOPHAGOGASTRODUODENOSCOPY (EGD) WITH PROPOFOL N/A 06/06/2015   Procedure: ESOPHAGOGASTRODUODENOSCOPY (EGD) WITH PROPOFOL;  Surgeon: Arta Silence, MD;  [facility]: [facility];  Service: Endoscopy;  Laterality: N/A;   GASTRIC VARICES BANDING N/A 05/02/2015   Procedure: GASTRIC VARICES BANDING;  Surgeon: Arta Silence, MD;  [facility];  Service: Endoscopy;  Laterality: N/A;   HEMORROIDECTOMY     LAMINECTOMY  1989   lumbar 4-5   lapaendectomy  yrs ago   LAPAROSCOPY  1989   adhesions   oophrectomy Left 1989   TONSILLECTOMY     age 77   Social History   Socioeconomic History   Marital status: Single    Spouse name: Not on file   Number of children: Not on file   Years of education: Not on file   Highest education level: Not on file  Occupational History   Not on file  Tobacco Use   Smoking status: Former    Types: Cigarettes    Start date: 04/17/1968    Quit date: 04/17/1973    Years since quitting: 58.5  Smokeless tobacco: Never  Substance and Sexual Activity   Alcohol use: No    Alcohol/week: 10.0 standard drinks    Types: 10 Glasses of wine per week    Comment: currently states she is not drinking but states she usually had 2 glasses of wine per evening   Drug use: No   Sexual activity: Not on file  Other Topics Concern   Not on file  Social History Narrative   Not on file   Social Determinants of Health   Financial Resource Strain: Not on file  Food Insecurity: Not on file  Transportation Needs: Not on file  Physical Activity: Not on file  Stress: Not on file   Social Connections: Not on file   Allergies  Allergen Reactions   Carvedilol Other (See Comments)    Other reaction(s): Hypertension (intolerance), Rapid Heart Rate Increases BP to 300 - per pt   Family History  Problem Relation Age of Onset   Diabetes Mother    Cancer Mother    Hypertension Mother    Hyperlipidemia Mother    Lupus Mother    Vitiligo Mother    Heart failure Father    Heart failure Maternal Grandmother      Current Outpatient Medications (Cardiovascular):    Alirocumab (PRALUENT) 75 MG/ML SOAJ, Inject 75 mg into the skin every 14 (fourteen) days.   furosemide (LASIX) 20 MG tablet, Take 20 mg by mouth 2 (two) times daily.   nadolol (CORGARD) 20 MG tablet, Take 10 mg by mouth daily.   spironolactone (ALDACTONE) 50 MG tablet, Take 50 mg by mouth 2 (two) times daily.   Current Outpatient Medications (Analgesics):    aspirin 81 MG EC tablet, Take 81 mg by mouth 2 (two) times a week.   Current Outpatient Medications (Other):    gabapentin (NEURONTIN) 100 MG capsule, Take 2 capsules (200 mg total) by mouth at bedtime.   lactulose (CHRONULAC) 10 GM/15ML solution, 3-4 tablespoons daily by mouth   Multiple Vitamins-Minerals (MULTIVITAMIN WITH MINERALS) tablet, Take 1 tablet by mouth once a week.    polyethylene glycol powder (GLYCOLAX/MIRALAX) 17 GM/SCOOP powder, Take by mouth.   rifaximin (XIFAXAN) 550 MG TABS tablet, Take 550 mg by mouth 2 (two) times daily.   Reviewed prior external information including notes and imaging from  primary care provider As well as notes that were available from care everywhere and other healthcare systems.  Past medical history, social, surgical and family history all reviewed in electronic medical record.  No pertanent information unless stated regarding to the chief complaint.   Review of Systems:  No headache, visual changes, nausea, vomiting, diarrhea, constipation, diz chest pain, shortness of breath, mood changes. POSITIVE  muscle aches, body aches, joint swelling, fluid retention  Objective  Blood pressure 110/60, pulse 77, height '5\' 6"'$  (1.676 m), weight 169 lb (76.7 kg), SpO2 96 %.   General: No apparent distress alert and oriented x3 mood and affect normal, dressed appropriately.  HEENT: Pupils equal, extraocular movements intact  Respiratory: Patient's speak in full sentences and does not appear short of breath  Cardiovascular: No lower extremity edema, non tender, no erythema  Gait normal with good balance and coordination.  MSK: Right hip exam shows the patient does have tenderness to palpation of the greater trochanteric area.  Patient also has pain over the right gluteal area.  Patient has mild positive FABER test.  Negative straight leg test but does have some very minimal discomfort of the paraspinal musculature of the  lumbar spine.    Procedure: Real-time Ultrasound Guided Injection of right greater trochanteric bursitis secondary to patient's body habitus Device: GE Logiq Q7 Ultrasound guided injection is preferred based studies that show increased duration, increased effect, greater accuracy, decreased procedural pain, increased response rate, and decreased cost with ultrasound guided versus blind injection.  Verbal informed consent obtained.  Time-out conducted.  Noted no overlying erythema, induration, or other signs of local infection.  Skin prepped in a sterile fashion.  Local anesthesia: Topical Ethyl chloride.  With sterile technique and under real time ultrasound guidance:  Greater trochanteric area was visualized and patient's bursa was noted. A 22-gauge 3 inch needle was inserted and 4 cc of 0.5% Marcaine and 1 cc of Kenalog 40 mg/dL was injected.  More from a posterior approach. Completed without difficulty  Pain immediately resolved suggesting accurate placement of the medication.  Advised to call if fevers/chills, erythema, induration, drainage, or persistent bleeding.  Impression:  Technically successful ultrasound guided injection.   Impression and Recommendations:     The above documentation has been reviewed and is accurate and complete Lyndal Pulley, DO

## 2020-11-02 ENCOUNTER — Other Ambulatory Visit: Payer: Self-pay

## 2020-11-02 ENCOUNTER — Other Ambulatory Visit: Payer: Medicare Other | Admitting: *Deleted

## 2020-11-02 DIAGNOSIS — I2510 Atherosclerotic heart disease of native coronary artery without angina pectoris: Secondary | ICD-10-CM

## 2020-11-02 DIAGNOSIS — I2583 Coronary atherosclerosis due to lipid rich plaque: Secondary | ICD-10-CM | POA: Diagnosis not present

## 2020-11-02 LAB — LIPID PANEL
Chol/HDL Ratio: 2.3 ratio (ref 0.0–4.4)
Cholesterol, Total: 161 mg/dL (ref 100–199)
HDL: 70 mg/dL (ref >39)
LDL Chol Calc (NIH): 75 mg/dL (ref 0–99)
Triglycerides: 84 mg/dL (ref 0–149)
VLDL Cholesterol Cal: 16 mg/dL (ref 5–40)

## 2020-11-06 ENCOUNTER — Other Ambulatory Visit: Payer: Self-pay

## 2020-11-06 ENCOUNTER — Encounter: Payer: Self-pay | Admitting: Family Medicine

## 2020-11-06 ENCOUNTER — Ambulatory Visit (INDEPENDENT_AMBULATORY_CARE_PROVIDER_SITE_OTHER): Payer: Medicare Other | Admitting: Family Medicine

## 2020-11-06 ENCOUNTER — Ambulatory Visit: Payer: Self-pay

## 2020-11-06 VITALS — BP 110/60 | HR 77 | Ht 66.0 in | Wt 169.0 lb

## 2020-11-06 DIAGNOSIS — I6523 Occlusion and stenosis of bilateral carotid arteries: Secondary | ICD-10-CM

## 2020-11-06 DIAGNOSIS — M25551 Pain in right hip: Secondary | ICD-10-CM | POA: Diagnosis not present

## 2020-11-06 DIAGNOSIS — M7061 Trochanteric bursitis, right hip: Secondary | ICD-10-CM

## 2020-11-06 NOTE — Patient Instructions (Signed)
Stay active See me in 3 months If not better, will consider MRI

## 2020-11-06 NOTE — Assessment & Plan Note (Signed)
Repeat injection given again today.  1 more of a posterior approach.  Could be more of a gluteal area as well.  With patient's history of cirrhosis if this continues to give her more discomfort we will consider the possibility of the abdominal and pelvis MRI with the possibility of a lumbar MRI as well.  Patient will continue all other conservative therapy and follow-up with me again in 6 weeks.  No change in the gabapentin

## 2020-12-18 DIAGNOSIS — K7031 Alcoholic cirrhosis of liver with ascites: Secondary | ICD-10-CM | POA: Diagnosis not present

## 2020-12-18 DIAGNOSIS — I8511 Secondary esophageal varices with bleeding: Secondary | ICD-10-CM | POA: Diagnosis not present

## 2020-12-27 DIAGNOSIS — Z23 Encounter for immunization: Secondary | ICD-10-CM | POA: Diagnosis not present

## 2021-01-03 ENCOUNTER — Other Ambulatory Visit: Payer: Self-pay | Admitting: Physician Assistant

## 2021-01-04 ENCOUNTER — Other Ambulatory Visit: Payer: Self-pay | Admitting: Physician Assistant

## 2021-01-04 DIAGNOSIS — K7031 Alcoholic cirrhosis of liver with ascites: Secondary | ICD-10-CM

## 2021-01-07 ENCOUNTER — Ambulatory Visit
Admission: RE | Admit: 2021-01-07 | Discharge: 2021-01-07 | Disposition: A | Discharge status: Home / Self Care | Payer: Medicare Other | Source: Ambulatory Visit | Attending: Physician Assistant | Admitting: Physician Assistant

## 2021-01-07 DIAGNOSIS — K7460 Unspecified cirrhosis of liver: Secondary | ICD-10-CM | POA: Diagnosis not present

## 2021-01-07 DIAGNOSIS — K7031 Alcoholic cirrhosis of liver with ascites: Secondary | ICD-10-CM

## 2021-01-07 DIAGNOSIS — K7689 Other specified diseases of liver: Secondary | ICD-10-CM | POA: Diagnosis not present

## 2021-02-07 DIAGNOSIS — Z1389 Encounter for screening for other disorder: Secondary | ICD-10-CM | POA: Diagnosis not present

## 2021-02-07 DIAGNOSIS — N183 Chronic kidney disease, stage 3 unspecified: Secondary | ICD-10-CM | POA: Diagnosis not present

## 2021-02-07 DIAGNOSIS — I700 Atherosclerosis of aorta: Secondary | ICD-10-CM | POA: Diagnosis not present

## 2021-02-07 DIAGNOSIS — K649 Unspecified hemorrhoids: Secondary | ICD-10-CM | POA: Diagnosis not present

## 2021-02-07 DIAGNOSIS — Z0000 Encounter for general adult medical examination without abnormal findings: Secondary | ICD-10-CM | POA: Diagnosis not present

## 2021-02-07 DIAGNOSIS — K7030 Alcoholic cirrhosis of liver without ascites: Secondary | ICD-10-CM | POA: Diagnosis not present

## 2021-02-07 DIAGNOSIS — E7800 Pure hypercholesterolemia, unspecified: Secondary | ICD-10-CM | POA: Diagnosis not present

## 2021-02-11 NOTE — Progress Notes (Signed)
[place] Phone: [phone] Subjective:   [name] No, am serving as a scribe for Dr. Hulan Saas. This visit occurred during the SARS-CoV-2 public health emergency.  Safety protocols were in place, including screening questions prior to the visit, additional usage of staff PPE, and extensive cleaning of exam room while observing appropriate contact time as indicated for disinfecting solutions.   I'm seeing this patient by the request  of:  Gaynelle Arabian, MD  CC: right hip pain   [id number]  11/06/2020 Repeat injection given again today.  1 more of a posterior approach.  Could be more of a gluteal area as well.  With patient's history of cirrhosis if this continues to give her more discomfort we will consider the possibility of the abdominal and pelvis MRI with the possibility of a lumbar MRI as well.  Patient will continue all other conservative therapy and follow-up with me again in 6 weeks.  No change in the gabapentin  Update 02/13/2021 GLENNETTE Barr is a 75 y.o. female coming in with complaint of R hip pain. Patient states that her hip pain improved somewhat but is bothering her with most motions. Has been trying to run and notices pain in lateral R hip.  Patient states that has been able to workout on a fairly regular basis.  Patient states still some tenderness at night and does feel like she has stiffness in the morning.      Past Medical History:  Diagnosis Date   Alcoholic cirrhosis ([place])    Ascites    Cirrhosis ([place])    Endometriosis    GERD (gastroesophageal reflux disease)    Liver cyst    Past Surgical History:  Procedure Laterality Date   CAROTID ENDARTERECTOMY Right 2008   egd with banding  2016   with bleeding [facility]   ESOPHAGOGASTRODUODENOSCOPY (EGD) WITH PROPOFOL N/A 05/02/2015   Procedure: ESOPHAGOGASTRODUODENOSCOPY (EGD) WITH PROPOFOL;  Surgeon:  Arta Silence, MD;  [facility];  Service: Endoscopy;  Laterality: N/A;   ESOPHAGOGASTRODUODENOSCOPY (EGD) WITH PROPOFOL N/A 06/06/2015   Procedure: ESOPHAGOGASTRODUODENOSCOPY (EGD) WITH PROPOFOL;  Surgeon: Arta Silence, MD;  [facility];  Service: Endoscopy;  Laterality: N/A;   GASTRIC VARICES BANDING N/A 05/02/2015   Procedure: GASTRIC VARICES BANDING;  Surgeon: Arta Silence, MD;  [facility];  Service: Endoscopy;  Laterality: N/A;   HEMORROIDECTOMY     LAMINECTOMY  1989   lumbar 4-5   lapaendectomy  yrs ago   LAPAROSCOPY  1989   adhesions   oophrectomy Left 1989   TONSILLECTOMY     age 15   Social History   Socioeconomic History   Marital status: Single    Spouse name: Not on file   Number of children: Not on file   Years of education: Not on file   Highest education level: Not on file  Occupational History   Not on file  Tobacco Use   Smoking status: Former    Types: Cigarettes    Start date: 04/17/1968    Quit date: 04/17/1973    Years since quitting: 47.8   Smokeless tobacco: Never  Substance and Sexual Activity   Alcohol use: No    Alcohol/week: 10.0 standard drinks    Types: 10 Glasses of wine per week    Comment: currently states she is not drinking but states she usually had 2 glasses of wine per evening   Drug use:  No   Sexual activity: Not on file  Other Topics Concern   Not on file  Social History Narrative   Not on file   Social Determinants of Health   Financial Resource Strain: Not on file  Food Insecurity: Not on file  Transportation Needs: Not on file  Physical Activity: Not on file  Stress: Not on file  Social Connections: Not on file   Allergies  Allergen Reactions   Carvedilol Other (See Comments)    Other reaction(s): Hypertension (intolerance), Rapid Heart Rate Increases BP to 300 - per pt   Family History  Problem Relation Age of Onset   Diabetes Mother    Cancer Mother    Hypertension Mother     Hyperlipidemia Mother    Lupus Mother    Vitiligo Mother    Heart failure Father    Heart failure Maternal Grandmother      Current Outpatient Medications (Cardiovascular):    Alirocumab (PRALUENT) 75 MG/ML SOAJ, Inject 75 mg into the skin every 14 (fourteen) days.   furosemide (LASIX) 20 MG tablet, Take 20 mg by mouth 2 (two) times daily.   nadolol (CORGARD) 20 MG tablet, Take 10 mg by mouth daily.   spironolactone (ALDACTONE) 50 MG tablet, Take 50 mg by mouth 2 (two) times daily.   Current Outpatient Medications (Analgesics):    aspirin 81 MG EC tablet, Take 81 mg by mouth 2 (two) times a week.   Current Outpatient Medications (Other):    gabapentin (NEURONTIN) 100 MG capsule, Take 2 capsules (200 mg total) by mouth at bedtime.   lactulose (CHRONULAC) 10 GM/15ML solution, 3-4 tablespoons daily by mouth   Multiple Vitamins-Minerals (MULTIVITAMIN WITH MINERALS) tablet, Take 1 tablet by mouth once a week.    polyethylene glycol powder (GLYCOLAX/MIRALAX) 17 GM/SCOOP powder, Take by mouth.   rifaximin (XIFAXAN) 550 MG TABS tablet, Take 550 mg by mouth 2 (two) times daily.   Reviewed prior external information including notes and imaging from  primary care provider As well as notes that were available from care everywhere and other healthcare systems.  Past medical history, social, surgical and family history all reviewed in electronic medical record.  No pertanent information unless stated regarding to the chief complaint.   Review of Systems:  No headache, visual changes, nausea, vomiting, diarrhea, constipation, dizziness, abdominal pain, skin rash, fevers, chills, night sweats, weight loss, swollen lymph nodes, body aches, joint swelling, chest pain, shortness of breath, mood changes. POSITIVE muscle aches  Objective  Blood pressure 108/62, pulse 78, height 5\' 6"  (1.676 m), weight 163 lb (73.9 kg), SpO2 98 %.   General: No apparent distress alert and oriented x3 mood and affect  normal, dressed appropriately.  HEENT: Pupils equal, extraocular movements intact  Respiratory: Patient's speak in full sentences and does not appear short of breath  Cardiovascular: No lower extremity edema, non tender, no erythema  Gait normal with good balance and coordination.  MSK: Patient's right hip is does still have some tenderness to palpation over the greater trochanteric area in the gluteal area on the right side.  Patient does have 4 out of 5 strength of the hip abductors but seems to be symmetric to the contralateral side.  Negative straight leg test.    Impression and Recommendations:     The above documentation has been reviewed and is accurate and complete Lyndal Pulley, DO

## 2021-02-13 ENCOUNTER — Ambulatory Visit (INDEPENDENT_AMBULATORY_CARE_PROVIDER_SITE_OTHER): Payer: Medicare Other | Admitting: Family Medicine

## 2021-02-13 ENCOUNTER — Ambulatory Visit: Payer: Self-pay

## 2021-02-13 ENCOUNTER — Encounter: Payer: Self-pay | Admitting: Family Medicine

## 2021-02-13 ENCOUNTER — Other Ambulatory Visit: Payer: Self-pay

## 2021-02-13 VITALS — BP 108/62 | HR 78 | Ht 66.0 in | Wt 163.0 lb

## 2021-02-13 DIAGNOSIS — M7061 Trochanteric bursitis, right hip: Secondary | ICD-10-CM

## 2021-02-13 DIAGNOSIS — I6523 Occlusion and stenosis of bilateral carotid arteries: Secondary | ICD-10-CM | POA: Diagnosis not present

## 2021-02-13 DIAGNOSIS — M25551 Pain in right hip: Secondary | ICD-10-CM

## 2021-02-13 NOTE — Assessment & Plan Note (Signed)
Recurrent problem and we could consider the possibility of injection but patient declined at the moment.  Patient wants to continue to do the home exercises and icing regimen.  Due to patient's other comorbidities including the cirrhosis does make it difficult for [place] to do a lot of different medications and do feel that the injections would be more beneficial when necessary.  Follow-up with me again in 2 to 3 months.

## 2021-02-13 NOTE — Patient Instructions (Signed)
Arnica lotion to the foot 2x a day Ice hip after working out every time Continue to work on hip abductor strength See me in 3 months

## 2021-02-26 DIAGNOSIS — K7030 Alcoholic cirrhosis of liver without ascites: Secondary | ICD-10-CM | POA: Diagnosis not present

## 2021-02-26 DIAGNOSIS — K641 Second degree hemorrhoids: Secondary | ICD-10-CM | POA: Diagnosis not present

## 2021-03-22 ENCOUNTER — Telehealth: Payer: Self-pay | Admitting: Family Medicine

## 2021-03-22 NOTE — Telephone Encounter (Signed)
Patient called requesting a refill on her gabapentin (NEURONTIN) 100 MG capsule.  Pharmacy: [facility]

## 2021-03-25 ENCOUNTER — Other Ambulatory Visit: Payer: Self-pay

## 2021-03-25 MED ORDER — GABAPENTIN 100 MG PO CAPS: 200.0000 mg | ORAL_CAPSULE | Freq: Every day | ORAL | 3 refills | Status: DC

## 2021-03-25 NOTE — Telephone Encounter (Signed)
Rx refilled.

## 2021-04-16 DIAGNOSIS — H4323 Crystalline deposits in vitreous body, bilateral: Secondary | ICD-10-CM | POA: Diagnosis not present

## 2021-04-16 DIAGNOSIS — Z961 Presence of intraocular lens: Secondary | ICD-10-CM | POA: Diagnosis not present

## 2021-05-07 ENCOUNTER — Ambulatory Visit (HOSPITAL_COMMUNITY)
Admission: RE | Admit: 2021-05-07 | Discharge: 2021-05-07 | Disposition: A | Discharge status: Home / Self Care | Payer: Medicare Other | Source: Ambulatory Visit | Attending: Cardiovascular Disease | Admitting: Cardiovascular Disease

## 2021-05-07 ENCOUNTER — Other Ambulatory Visit: Payer: Self-pay

## 2021-05-07 DIAGNOSIS — I6523 Occlusion and stenosis of bilateral carotid arteries: Secondary | ICD-10-CM | POA: Insufficient documentation

## 2021-05-09 NOTE — Progress Notes (Signed)
[place] Barr [address] [place] Phone: [phone] Subjective:   [name], am serving as a scribe for Dr. Hulan Saas. This visit occurred during the SARS-CoV-2 public health emergency.  Safety protocols were in place, including screening questions prior to the visit, additional usage of staff PPE, and extensive cleaning of exam room while observing appropriate contact time as indicated for disinfecting solutions.   I'm seeing this patient by the request  of:  Gaynelle Arabian, MD  CC: Right hip pain  [id number]  02/13/2021 Recurrent problem and we could consider the possibility of injection but patient declined at the moment.  Patient wants to continue to do the home exercises and icing regimen.  Due to patient's other comorbidities including the cirrhosis does make it difficult for [place] to do a lot of different medications and do feel that the injections would be more beneficial when necessary.  Follow-up with me again in 2 to 3 months.  Update 05/15/2021 Nicole Barr is a 76 y.o. female coming in with complaint of R hip pain. Patient states pain has subsided, but not as bad. No interventions have been helping. Trying to hold out for the injection. Pain is keeping her from exercising. Believes that her weight is contributing to a lot of her problems. No other complaints.     Past Medical History:  Diagnosis Date   Alcoholic cirrhosis ([place])    Ascites    Cirrhosis ([place])    Endometriosis    GERD (gastroesophageal reflux disease)    Liver cyst    Past Surgical History:  Procedure Laterality Date   CAROTID ENDARTERECTOMY Right 2008   egd with banding  2016   with bleeding [facility]   ESOPHAGOGASTRODUODENOSCOPY (EGD) WITH PROPOFOL N/A 05/02/2015   Procedure: ESOPHAGOGASTRODUODENOSCOPY (EGD) WITH PROPOFOL;  Surgeon: Arta Silence, MD;  [facility];  Service: Endoscopy;  Laterality: N/A;    ESOPHAGOGASTRODUODENOSCOPY (EGD) WITH PROPOFOL N/A 06/06/2015   Procedure: ESOPHAGOGASTRODUODENOSCOPY (EGD) WITH PROPOFOL;  Surgeon: Arta Silence, MD;  [facility];  Service: Endoscopy;  Laterality: N/A;   GASTRIC VARICES BANDING N/A 05/02/2015   Procedure: GASTRIC VARICES BANDING;  Surgeon: Arta Silence, MD;  [facility];  Service: Endoscopy;  Laterality: N/A;   HEMORROIDECTOMY     LAMINECTOMY  1989   lumbar 4-5   lapaendectomy  yrs ago   LAPAROSCOPY  1989   adhesions   oophrectomy Left 1989   TONSILLECTOMY     age 7   Social History   Socioeconomic History   Marital status: Single    Spouse name: Not on file   Number of children: Not on file   Years of education: Not on file   Highest education level: Not on file  Occupational History   Not on file  Tobacco Use   Smoking status: Former    Types: Cigarettes    Start date: 04/17/1968    Quit date: 04/17/1973    Years since quitting: 48.1   Smokeless tobacco: Never  Substance and Sexual Activity   Alcohol use: No    Alcohol/week: 10.0 standard drinks    Types: 10 Glasses of wine per week    Comment: currently states she is not drinking but states she usually had 2 glasses of wine per evening   Drug use: No   Sexual activity: Not on file  Other Topics Concern   Not on file  Social History Narrative   Not  on file   Social Determinants of Health   Financial Resource Strain: Not on file  Food Insecurity: Not on file  Transportation Needs: Not on file  Physical Activity: Not on file  Stress: Not on file  Social Connections: Not on file   Allergies  Allergen Reactions   Carvedilol Other (See Comments)    Other reaction(s): Hypertension (intolerance), Rapid Heart Rate Increases BP to 300 - per pt   Family History  Problem Relation Age of Onset   Diabetes Mother    Cancer Mother    Hypertension Mother    Hyperlipidemia Mother    Lupus Mother    Vitiligo Mother    Heart failure Father     Heart failure Maternal Grandmother      Current Outpatient Medications (Cardiovascular):    Alirocumab (PRALUENT) 75 MG/ML SOAJ, Inject 75 mg into the skin every 14 (fourteen) days.   furosemide (LASIX) 20 MG tablet, Take 20 mg by mouth 2 (two) times daily.   nadolol (CORGARD) 20 MG tablet, Take 10 mg by mouth daily.   spironolactone (ALDACTONE) 50 MG tablet, Take 50 mg by mouth 2 (two) times daily.   Current Outpatient Medications (Analgesics):    aspirin 81 MG EC tablet, Take 81 mg by mouth 2 (two) times a week.   Current Outpatient Medications (Other):    gabapentin (NEURONTIN) 100 MG capsule, Take 2 capsules (200 mg total) by mouth at bedtime.   lactulose (CHRONULAC) 10 GM/15ML solution, 3-4 tablespoons daily by mouth   Multiple Vitamins-Minerals (MULTIVITAMIN WITH MINERALS) tablet, Take 1 tablet by mouth once a week.    polyethylene glycol powder (GLYCOLAX/MIRALAX) 17 GM/SCOOP powder, Take by mouth.   rifaximin (XIFAXAN) 550 MG TABS tablet, Take 550 mg by mouth 2 (two) times daily.    Review of Systems:  No headache, visual changes, nausea, vomiting, diarrhea, constipation, dizziness, abdominal pain, skin rash, fevers, chills, night sweats, weight loss, swollen lymph nodes, body aches, joint swelling, chest pain, shortness of breath, mood changes. POSITIVE muscle aches, body aches  Objective  Blood pressure 116/66, pulse 68, height 5\' 6"  (1.676 m), weight 164 lb (74.4 kg), SpO2 97 %.   General: No apparent distress alert and oriented x3 mood and affect normal, dressed appropriately.  HEENT: Pupils equal, extraocular movements intact  Respiratory: Patient's speak in full sentences and does not appear short of breath  Cardiovascular: No lower extremity edema, non tender, no erythema  Gait normal with good balance and coordination.  MSK: Right hip exam.  Tenderness over the greater trochanteric area.  Moderate to severe.  Positive [place]y Sox.  Negative straight leg test.   Neurovascularly intact distally.    Impression and Recommendations:     The above documentation has been reviewed and is accurate and complete Lyndal Pulley, DO

## 2021-05-15 ENCOUNTER — Ambulatory Visit (INDEPENDENT_AMBULATORY_CARE_PROVIDER_SITE_OTHER): Payer: Medicare Other | Admitting: Family Medicine

## 2021-05-15 ENCOUNTER — Other Ambulatory Visit: Payer: Self-pay

## 2021-05-15 VITALS — BP 116/66 | HR 68 | Ht 66.0 in | Wt 164.0 lb

## 2021-05-15 DIAGNOSIS — R635 Abnormal weight gain: Secondary | ICD-10-CM | POA: Diagnosis not present

## 2021-05-15 DIAGNOSIS — I6523 Occlusion and stenosis of bilateral carotid arteries: Secondary | ICD-10-CM | POA: Diagnosis not present

## 2021-05-15 NOTE — Patient Instructions (Signed)
We will keep watching hip Ice after activity See you again in 2-3 months

## 2021-05-21 ENCOUNTER — Telehealth: Payer: Self-pay | Admitting: Family Medicine

## 2021-05-21 NOTE — Telephone Encounter (Signed)
We can try nutrition with [facility].

## 2021-05-21 NOTE — Telephone Encounter (Signed)
Patient called stating that she was not able to be seen at [facility] because her BMI does not meet their requirements.  She asked if there was somewhere else that she go to that would be able to help her with diet, what to eat, and portion control?  Please advise.

## 2021-05-22 ENCOUNTER — Other Ambulatory Visit: Payer: Self-pay

## 2021-05-22 DIAGNOSIS — E663 Overweight: Secondary | ICD-10-CM

## 2021-05-22 NOTE — Telephone Encounter (Signed)
Referral placed for nutrition. Patient notified.

## 2021-05-27 DIAGNOSIS — K5900 Constipation, unspecified: Secondary | ICD-10-CM | POA: Diagnosis not present

## 2021-05-27 DIAGNOSIS — R8290 Unspecified abnormal findings in urine: Secondary | ICD-10-CM | POA: Diagnosis not present

## 2021-05-27 DIAGNOSIS — N183 Chronic kidney disease, stage 3 unspecified: Secondary | ICD-10-CM | POA: Diagnosis not present

## 2021-05-27 DIAGNOSIS — K7460 Unspecified cirrhosis of liver: Secondary | ICD-10-CM | POA: Diagnosis not present

## 2021-05-27 DIAGNOSIS — I700 Atherosclerosis of aorta: Secondary | ICD-10-CM | POA: Diagnosis not present

## 2021-06-18 ENCOUNTER — Ambulatory Visit (INDEPENDENT_AMBULATORY_CARE_PROVIDER_SITE_OTHER): Payer: Medicare Other | Admitting: Internal Medicine

## 2021-06-18 ENCOUNTER — Encounter: Payer: Self-pay | Admitting: Internal Medicine

## 2021-06-18 ENCOUNTER — Other Ambulatory Visit: Payer: Self-pay

## 2021-06-18 VITALS — BP 128/74 | HR 60 | Ht 66.0 in | Wt 160.0 lb

## 2021-06-18 DIAGNOSIS — K7030 Alcoholic cirrhosis of liver without ascites: Secondary | ICD-10-CM | POA: Diagnosis not present

## 2021-06-18 DIAGNOSIS — I25118 Atherosclerotic heart disease of native coronary artery with other forms of angina pectoris: Secondary | ICD-10-CM

## 2021-06-18 DIAGNOSIS — I6523 Occlusion and stenosis of bilateral carotid arteries: Secondary | ICD-10-CM

## 2021-06-18 NOTE — Patient Instructions (Addendum)
Medication Instructions:  ?Your physician recommends that you continue on your current medications as directed. Please refer to the Current Medication list given to you today. ? ?RESTART: aspirin 81 mg  ?*If you need a refill on your cardiac medications before your next appointment, please call your pharmacy* ? ? ?Lab Work: ?NONE ?If you have labs (blood work) drawn today and your tests are completely normal, you will receive your results only by: ?MyChart Message (if you have MyChart) OR ?A paper copy in the mail ?If you have any lab test that is abnormal or we need to change your treatment, we will call you to review the results. ? ? ?Testing/Procedures: ?Your physician has requested that you have a carotid duplex in January 2024. This test is an ultrasound of the carotid arteries in your neck. It looks at blood flow through these arteries that supply the brain with blood. Allow one hour for this exam. There are no restrictions or special instructions.  ? ? ?Follow-Up: ?At [facility], you and your health needs are our priority.  As part of our continuing mission to provide you with exceptional heart care, we have created designated Provider Care Teams.  These Care Teams include your primary Cardiologist (physician) and Advanced Practice Providers (APPs -  Physician Assistants and Nurse Practitioners) who all work together to provide you with the care you need, when you need it. ? ? ?Your next appointment:   ?12 month(s) ? ?The format for your next appointment:   ?In Person ? ?Provider:   ?Werner Lean, MD   ? ?

## 2021-06-18 NOTE — Progress Notes (Signed)
?Cardiology Office Note:   ? ?Date:  06/18/2021  ? ?ID:  Nicole Barr, DOB 05/13/1945, MRN [id number] ? ?PCP:  Gaynelle Arabian, MD ?  ?Nicole Barr  ?Cardiologist:  Werner Lean, MD  ?Advanced Practice Provider:  No care team member to display ?Electrophysiologist:  None  ?    ?CC: One year, pre-op for hemorrhoid surgery ? ?History of Present Illness:   ? ?Nicole Barr is a 76 y.o. female with a hx of non-obstructive CAD and Carotid disease s/p CEA; alcoholic cirrhosis on transplant list ([facility]) with varices.  Through Dr. Francesca Oman Care [phone]) found to have moderate pLAD disease; no statin with cirrhosis.  Had minimal CAS plaque; was advised for one year follow up 04/2019.  Aortic atherosclerosis. ? ?Patient notes that she is doing OK.   ?She was going to the gym (able to run of the treadmill and exercise) but has had to stop things lately because she has had worsened. ? ?No chest pain or pressure .  No SOB and no PND/Orthopnea.   No palpitations or syncope.  Ever since she had the COVID-19 vaccine she has had issues taking a deep breath. ? ? ? ?Past Medical History:  ?Diagnosis Date  ? Alcoholic cirrhosis ([place])   ? Ascites   ? Cirrhosis ([place])   ? Endometriosis   ? GERD (gastroesophageal reflux disease)   ? Liver cyst   ? ? ?Past Surgical History:  ?Procedure Laterality Date  ? CAROTID ENDARTERECTOMY Right 2008  ? egd with banding  2016  ? with bleeding after wards done at [facility]  ? ESOPHAGOGASTRODUODENOSCOPY (EGD) WITH PROPOFOL N/A 05/02/2015  ? Procedure: ESOPHAGOGASTRODUODENOSCOPY (EGD) WITH PROPOFOL;  Surgeon: Arta Silence, MD;  Location: [facility];  Service: Endoscopy;  Laterality: N/A;  ? ESOPHAGOGASTRODUODENOSCOPY (EGD) WITH PROPOFOL N/A 06/06/2015  ? Procedure: ESOPHAGOGASTRODUODENOSCOPY (EGD) WITH PROPOFOL;  Surgeon: Arta Silence, MD;  [facility];  Service: Endoscopy;  Laterality: N/A;  ? GASTRIC VARICES BANDING N/A 05/02/2015  ? Procedure:  GASTRIC VARICES BANDING;  Surgeon: Arta Silence, MD;  [facility];  Service: Endoscopy;  Laterality: N/A;  ? HEMORROIDECTOMY    ? LAMINECTOMY  1989  ? lumbar 4-5  ? lapaendectomy  yrs ago  ? LAPAROSCOPY  1989  ? adhesions  ? oophrectomy Left 1989  ? TONSILLECTOMY    ? age 56  ? ? ?Current Medications: ?Current Meds  ?Medication Sig  ? Alirocumab (PRALUENT) 75 MG/ML SOAJ Inject 75 mg into the skin every 14 (fourteen) days.  ? Coenzyme Q10-Vitamin E (QUNOL ULTRA COQ10) 100-150 MG-UNIT CAPS Take 1 tablet by mouth See admin instructions.  ? furosemide (LASIX) 20 MG tablet Take 20 mg by mouth 2 (two) times daily.  ? gabapentin (NEURONTIN) 100 MG capsule Take 2 capsules (200 mg total) by mouth at bedtime.  ? lactulose (CHRONULAC) 10 GM/15ML solution 3-4 tablespoons daily by mouth  ? Multiple Vitamins-Minerals (MULTIVITAMIN WITH MINERALS) tablet Take 1 tablet by mouth once a week.   ? nadolol (CORGARD) 20 MG tablet Take 10 mg by mouth daily.  ? polyethylene glycol powder (GLYCOLAX/MIRALAX) 17 GM/SCOOP powder Take by mouth.  ? rifaximin (XIFAXAN) 550 MG TABS tablet Take 550 mg by mouth 2 (two) times daily.  ? spironolactone (ALDACTONE) 50 MG tablet Take 100 mg by mouth every morning.  ?  ? ?Allergies:   Carvedilol, Doxepin hcl, and Trazodone hcl  ? ?Social History  ? ?Socioeconomic History  ? Marital status: Single  ?  Spouse name: Not on file  ? Number of children: Not on file  ? Years of education: Not on file  ? Highest education level: Not on file  ?Occupational History  ? Not on file  ?Tobacco Use  ? Smoking status: Former  ?  Types: Cigarettes  ?  Start date: 04/17/1968  ?  Quit date: 04/17/1973  ?  Years since quitting: 48.2  ? Smokeless tobacco: Never  ?Substance and Sexual Activity  ? Alcohol use: No  ?  Alcohol/week: 10.0 standard drinks  ?  Types: 10 Glasses of wine per week  ?  Comment: currently states she is not drinking but states she usually had 2 glasses of wine per evening  ? Drug use: No  ?  Sexual activity: Not on file  ?Other Topics Concern  ? Not on file  ?Social History Narrative  ? Not on file  ? ?Social Determinants of Health  ? ?Financial Resource Strain: Not on file  ?Food Insecurity: Not on file  ?Transportation Needs: Not on file  ?Physical Activity: Not on file  ?Stress: Not on file  ?Social Connections: Not on file  ?  ?Social:  Former alcohol; complicated divorce, former Surveyor, [employer] at [employer] ? ?Family History: ?The patient's family history includes Cancer in her mother; Diabetes in her mother; Heart failure in her father and maternal grandmother; Hyperlipidemia in her mother; Hypertension in her mother; Lupus in her mother; Vitiligo in her mother. ? ?ROS:   ?Please see the history of present illness.    ? All other systems reviewed and are negative. ? ?EKGs/Labs/Other Studies Reviewed:   ? ?The following studies were reviewed today: ? ?EKG:   ?06/18/21: SR rate 60 ?06/15/20: Sinus Bradycardia rate 57 WNL ?05/13/19: SR rate 66 WNL ? ?Carotid Artery Duplex: ?Date: 05/03/2020 ?Results: ?Summary:  ?Right Carotid: The carotid endarterectomy site was well visualize  ?demonstrating  ?               normal patency with no evidence of significant diameter  ?               reduction.  ? ?Left Carotid: Velocities in the left ICA are consistent with a 1-39%  ?stenosis.  ?              Non-hemodynamically significant plaque <50% noted in the  ?CCA.  ?              Essentially stable LICA velocities.  ? ?Transthoracic Echocardiogram: ?Date:07/26/2014 ?Results: Reportedly- unable to access ?showed normal EF, normal diastolic function, trivial TR, and trivial pericardial effusion. ? ?CardiacCT : ?Date: 10/25/2014 ?Results: ?IMPRESSION: ?1. Coronary calcium score of 335. This was 59 percentile for age and ?sex matched control. ?  ?2. Normal coronary origin.  There is right and left co-dominance. ?  ?3. There is moderate non-obstructive CAD in the proximal LAD. ?Diffuse calcified plaque. ?  ?An aggressive risk  factor modification is recommended. ? ?Recent Labs: ?07/24/2020: ALT 18; BUN 17; Creatinine, Ser 1.10; Hemoglobin 13.4; Platelets 207.0; Potassium 3.6; Sodium 139  ?Recent Lipid Panel ?   ?Component Value Date/Time  ? CHOL 161 11/02/2020 0731  ? TRIG 84 11/02/2020 0731  ? HDL 70 11/02/2020 0731  ? CHOLHDL 2.3 11/02/2020 0731  ? [place] 75 11/02/2020 0731  ? ? ? ?Physical Exam:   ? ?VS:  BP 128/74   Pulse 60   Ht '5\' 6"'$  (1.676 m)   Wt 160 lb (72.6 kg)  SpO2 98%   BMI 25.82 kg/m?    ? ?Wt Readings from Last 3 Encounters:  ?06/18/21 160 lb (72.6 kg)  ?05/15/21 164 lb (74.4 kg)  ?02/13/21 163 lb (73.9 kg)  ? ?Gen: No distress, ?Neck: No JVD, L carotid bruit ?Cardiac: No Rubs or Gallops, no Murmur, RRR +2 radial pulses ?Respiratory: Clear to auscultation bilaterally, normal effort, normal  respiratory rate ?GI: Soft, nontender, non-distended  ?MS: No  edema;  moves all extremities ?Integument: Skin feels well ?Neuro:  At time of evaluation, alert and oriented to person/place/time/situation  ?Psych: Normal affect, patient feels well ? ? ?ASSESSMENT:   ? ?1. Coronary artery disease of native artery of native heart with stable angina pectoris ([place])   ?2. Carotid stenosis, asymptomatic, bilateral   ?3. Alcoholic cirrhosis of liver without ascites ([place])   ? ? ?PLAN:   ? ?Preoperative Risk Assessment ?- The Revised Cardiac Risk Index = 0.9% estimated risk of perioperative myocardial infarction, pulmonary edema, ventricular fibrillation, cardiac arrest, or complete heart block.  ?- DASI score of 33 associated with 7 functional mets ?- No further cardiac testing is recommended prior to surgery.  ?- The patient may proceed to surgery at acceptable risk.   ?-reviewed prior CT with patient ?- Our service is available as needed in the peri-operative period.   ?- this is a risk assessment of her cardiac risks only ? ?Coronary Artery Disease; Nonobstructive ?Aortic Atherosclerosis ?Hx of cirrhosis ?HLD ?- asymptomatic  ?- anatomy:  pLAD disease ?- restart ASA 81 mg; has had no bleeding of her esophageal varices on this medication, if worsening hemorrhoid bleeding can stop  ?- continue PCSKI9i, goal LDL < 70; will check in to make sure we

## 2021-06-24 DIAGNOSIS — K7031 Alcoholic cirrhosis of liver with ascites: Secondary | ICD-10-CM | POA: Diagnosis not present

## 2021-06-24 DIAGNOSIS — I8511 Secondary esophageal varices with bleeding: Secondary | ICD-10-CM | POA: Diagnosis not present

## 2021-07-04 ENCOUNTER — Other Ambulatory Visit: Payer: Self-pay | Admitting: Internal Medicine

## 2021-07-04 DIAGNOSIS — I2510 Atherosclerotic heart disease of native coronary artery without angina pectoris: Secondary | ICD-10-CM

## 2021-07-09 DIAGNOSIS — K6289 Other specified diseases of anus and rectum: Secondary | ICD-10-CM | POA: Diagnosis not present

## 2021-07-16 NOTE — Progress Notes (Signed)
?Nicole Barr D.O. ?[facility] ?[place] ?Phone: [phone] ?Subjective:   ?I, Nicole Barr, am serving as a scribe for Dr. Hulan Saas. ? ?This visit occurred during the SARS-CoV-2 public health emergency.  Safety protocols were in place, including screening questions prior to the visit, additional usage of staff PPE, and extensive cleaning of exam room while observing appropriate contact time as indicated for disinfecting solutions.  ? ?I'm seeing this patient by the request  of:  Gaynelle Arabian, MD ? ?CC: Hip and foot pain follow-up ? ?[id number]  ?Nicole Barr is a 76 y.o. female coming in with complaint of R hip pain. Last seen on 05/15/2021. Previously seen for R hip GT. Patient states that pain is better when she is in motion. Tries to go to the gym each day. Pain is less than last visit.  ? ?Patient continues to complain of "stone bruises" over ball of B feet. Using metatarsal cookies prn.  Has noticed a few changes to certain shoes and seems to get better.  Nothing about stopping her from activity.  Patient is more concerned with other health problems recently.  States that she has some type of surgery coming up.  Reviewing patient's chart it does appear that patient does have a rectal mass with a polyp polypoid and is scheduled in the near future, April 26. ? ? ? ? ? ?  ? ?Past Medical History:  ?Diagnosis Date  ? Alcoholic cirrhosis ([place])   ? Ascites   ? Cirrhosis ([place])   ? Endometriosis   ? GERD (gastroesophageal reflux disease)   ? Liver cyst   ? ?Past Surgical History:  ?Procedure Laterality Date  ? CAROTID ENDARTERECTOMY Right 2008  ? egd with banding  2016  ? with bleeding after wards done [facility]  ? ESOPHAGOGASTRODUODENOSCOPY (EGD) WITH PROPOFOL N/A 05/02/2015  ? Procedure: ESOPHAGOGASTRODUODENOSCOPY (EGD) WITH PROPOFOL;  Surgeon: Arta Silence, MD;  [facility];  Service: Endoscopy;  Laterality: N/A;  ?  ESOPHAGOGASTRODUODENOSCOPY (EGD) WITH PROPOFOL N/A 06/06/2015  ? Procedure: ESOPHAGOGASTRODUODENOSCOPY (EGD) WITH PROPOFOL;  Surgeon: Arta Silence, MD;  [facility];  Service: Endoscopy;  Laterality: N/A;  ? GASTRIC VARICES BANDING N/A 05/02/2015  ? Procedure: GASTRIC VARICES BANDING;  Surgeon: Arta Silence, MD;  [facility];  Service: Endoscopy;  Laterality: N/A;  ? HEMORROIDECTOMY    ? LAMINECTOMY  1989  ? lumbar 4-5  ? lapaendectomy  yrs ago  ? LAPAROSCOPY  1989  ? adhesions  ? oophrectomy Left 1989  ? TONSILLECTOMY    ? age 18  ? ?Social History  ? ?Socioeconomic History  ? Marital status: Single  ?  Spouse name: Not on file  ? Number of children: Not on file  ? Years of education: Not on file  ? Highest education level: Not on file  ?Occupational History  ? Not on file  ?Tobacco Use  ? Smoking status: Former  ?  Types: Cigarettes  ?  Start date: 04/17/1968  ?  Quit date: 04/17/1973  ?  Years since quitting: 48.2  ? Smokeless tobacco: Never  ?Substance and Sexual Activity  ? Alcohol use: No  ?  Alcohol/week: 10.0 standard drinks  ?  Types: 10 Glasses of wine per week  ?  Comment: currently states she is not drinking but states she usually had 2 glasses of wine per evening  ? Drug use: No  ? Sexual activity: Not on file  ?Other Topics Concern  ?  Not on file  ?Social History Narrative  ? Not on file  ? ?Social Determinants of Health  ? ?Financial Resource Strain: Not on file  ?Food Insecurity: Not on file  ?Transportation Needs: Not on file  ?Physical Activity: Not on file  ?Stress: Not on file  ?Social Connections: Not on file  ? ?Allergies  ?Allergen Reactions  ? Carvedilol Other (See Comments)  ?  Hypertension (intolerance), Rapid Heart Rate ?Increases BP to 300 - per pt ?  ? Doxepin Hcl   ?  Other reaction(s): does not do anything for her  ? Trazodone Hcl   ?  Other reaction(s): in effective for sleep (2016)  ? ?Family History  ?Problem Relation Age of Onset  ? Diabetes Mother   ? Cancer  Mother   ? Hypertension Mother   ? Hyperlipidemia Mother   ? Lupus Mother   ? Vitiligo Mother   ? Heart failure Father   ? Heart failure Maternal Grandmother   ? ? ? ?Current Outpatient Medications (Cardiovascular):  ?  Alirocumab (PRALUENT) 75 MG/ML SOAJ, INJECT '75MG'$  INTO THE SKIN EVERY 14 DAYS ?  furosemide (LASIX) 20 MG tablet, Take 20 mg by mouth 2 (two) times daily. ?  nadolol (CORGARD) 20 MG tablet, Take 10 mg by mouth daily. ?  spironolactone (ALDACTONE) 50 MG tablet, Take 100 mg by mouth every morning. ? ? ?Current Outpatient Medications (Analgesics):  ?  aspirin 81 MG EC tablet, Take 81 mg by mouth 2 (two) times a week. ? ? ?Current Outpatient Medications (Other):  ?  Coenzyme Q10-Vitamin E (QUNOL ULTRA COQ10) 100-150 MG-UNIT CAPS, Take 1 tablet by mouth See admin instructions. ?  gabapentin (NEURONTIN) 100 MG capsule, Take 2 capsules (200 mg total) by mouth at bedtime. ?  lactulose (CHRONULAC) 10 GM/15ML solution, 3-4 tablespoons daily by mouth ?  Multiple Vitamins-Minerals (MULTIVITAMIN WITH MINERALS) tablet, Take 1 tablet by mouth once a week.  ?  polyethylene glycol powder (GLYCOLAX/MIRALAX) 17 GM/SCOOP powder, Take by mouth. ?  rifaximin (XIFAXAN) 550 MG TABS tablet, Take 550 mg by mouth 2 (two) times daily. ? ? ?Reviewed prior external information including notes and imaging from  ?primary care provider ?As well as notes that were available from care everywhere and other healthcare systems. ? ?Past medical history, social, surgical and family history all reviewed in electronic medical record.  No pertanent information unless stated regarding to the chief complaint.  ? ?Review of Systems: ? No headache, visual changes, nausea, vomiting, diarrhea, constipation, dizziness, abdominal pain, skin rash, fevers, chills, night sweats, weight loss, swollen lymph nodes, joint swelling, chest pain, shortness of breath, mood changes. POSITIVE muscle aches, body aches ? ?Objective  ?Blood pressure 122/80, pulse 67,  height '5\' 6"'$  (1.676 m), weight 156 lb (70.8 kg), SpO2 98 %. ?  ?General: No apparent distress alert and oriented x3 mood and affect normal, dressed appropriately.  ?HEENT: Pupils equal, extraocular movements intact  ?Respiratory: Patient's speak in full sentences and does not appear short of breath  ?Cardiovascular: No lower extremity edema, non tender, no erythema  ?Gait normal with good balance and coordination.  ?MSK: Patient does have arthritic changes of multiple joints.  Diffusely tender to palpation of the greater trochanteric area right and left side.  Patient does have positive FABER test noted.  Minimal pain on the posterior aspect of the back. ? ?  ?Impression and Recommendations:  ?  ? ?The above documentation has been reviewed and is accurate and complete Lyndal Pulley,  DO ? ? ? ?

## 2021-07-16 NOTE — Progress Notes (Signed)
Surgery orders requested via Epic inbox. °

## 2021-07-17 ENCOUNTER — Ambulatory Visit: Payer: Self-pay

## 2021-07-17 ENCOUNTER — Ambulatory Visit (INDEPENDENT_AMBULATORY_CARE_PROVIDER_SITE_OTHER): Payer: Medicare Other | Admitting: Family Medicine

## 2021-07-17 ENCOUNTER — Encounter: Payer: Self-pay | Admitting: Family Medicine

## 2021-07-17 ENCOUNTER — Other Ambulatory Visit: Payer: Self-pay | Admitting: Gastroenterology

## 2021-07-17 VITALS — BP 122/80 | HR 67 | Ht 66.0 in | Wt 156.0 lb

## 2021-07-17 DIAGNOSIS — M25551 Pain in right hip: Secondary | ICD-10-CM | POA: Diagnosis not present

## 2021-07-17 DIAGNOSIS — I6523 Occlusion and stenosis of bilateral carotid arteries: Secondary | ICD-10-CM | POA: Diagnosis not present

## 2021-07-17 DIAGNOSIS — M7061 Trochanteric bursitis, right hip: Secondary | ICD-10-CM | POA: Diagnosis not present

## 2021-07-17 DIAGNOSIS — K7460 Unspecified cirrhosis of liver: Secondary | ICD-10-CM

## 2021-07-17 NOTE — Patient Instructions (Addendum)
[email or web address] for metatarsal cookies ?Good to see you  ?I will be thinking of you  ?Good luck on your surgery  ?Follow up 2 months after the surgery  ?

## 2021-07-17 NOTE — Assessment & Plan Note (Signed)
Patient wants to hold on any type of injection at this point.  We have discussed previously about the possibility of a lumbar radiculopathy.  Still continuing on the gabapentin.  Patient is more concerned with more of the upcoming surgery the patient is having.  Encourage patient to focus on that at the moment and will follow-up with me again in 2 months afterwards.  Time discussing with patient more about her upcoming procedure and her anxiety other medical problems greater than 33 minutes. ?

## 2021-07-18 ENCOUNTER — Ambulatory Visit: Payer: Self-pay | Admitting: Surgery

## 2021-07-18 DIAGNOSIS — K7290 Hepatic failure, unspecified without coma: Secondary | ICD-10-CM

## 2021-07-18 DIAGNOSIS — K7210 Chronic hepatic failure without coma: Secondary | ICD-10-CM

## 2021-07-18 DIAGNOSIS — Z01818 Encounter for other preprocedural examination: Secondary | ICD-10-CM

## 2021-07-19 ENCOUNTER — Ambulatory Visit
Admission: RE | Admit: 2021-07-19 | Discharge: 2021-07-19 | Disposition: A | Discharge status: Home / Self Care | Payer: Medicare Other | Source: Ambulatory Visit | Attending: Gastroenterology | Admitting: Gastroenterology

## 2021-07-19 DIAGNOSIS — K7460 Unspecified cirrhosis of liver: Secondary | ICD-10-CM

## 2021-07-22 NOTE — Patient Instructions (Addendum)
2 VISITOR  ARE  ALLOWED TO COME WITH YOU AND STAY IN THE WAITING ROOM ONLY DURING PRE OP AND PROCEDURE.   ?**NO VISITORS ARE ALLOWED IN THE SHORT STAY AREA OR RECOVERY ROOM!!** ? ? ? ? Your procedure is scheduled on: 07/31/21 ? ? Report to [facility] Main Entrance ? ?  Report to admitting at 11:45 AM ? ? Call this number if you have problems the morning of surgery [phone] ? ? Do not eat food :After Midnight. ? ? After Midnight you may have the following liquids until __8:00_ AM DAY OF SURGERY ? ?Water ?Black Coffee (sugar ok, NO MILK/CREAM OR CREAMERS)  ?Tea (sugar ok, NO MILK/CREAM OR CREAMERS) regular and decaf                             ?Plain Jell-O (NO RED)                                           ?Fruit ices (not with fruit pulp, NO RED)                                     ?Popsicles (NO RED)                                                                  ?Juice: apple, WHITE grape, WHITE cranberry ?Sports drinks like Gatorade (NO RED) ?Clear broth(vegetable,chicken,beef) ? ?             ? ?  ?       If you have questions, please contact your surgeon?s office. ? ? ?FOLLOW BOWEL PREP AND ANY ADDITIONAL PRE OP INSTRUCTIONS YOU RECEIVED FROM YOUR SURGEON'S OFFICE!!! ?  ?  ?Oral Hygiene is also important to reduce your risk of infection.                                    ?Remember - BRUSH YOUR TEETH THE MORNING OF SURGERY WITH YOUR REGULAR TOOTHPASTE ? ? Do NOT smoke after Midnight ? ? Take these medicines the morning of surgery with A SIP OF WATER: Nadolol ? ? ?Bring CPAP mask and tubing day of surgery. ?                  ?           You may not have any metal on your body including hair pins, jewelry, and body piercing ? ?           Do not wear make-up, lotions, powders, perfumes or deodorant ? ?Do not wear nail polish including gel and S&S, artificial/acrylic nails, or any other type of covering on natural nails including finger and toenails. If you have artificial nails, gel coating, etc.  that needs to be removed by a nail salon please have this removed prior to surgery or surgery may need to be canceled/ delayed if the surgeon/ anesthesia feels like they are  unable to be safely monitored.  ? ?Do not shave  48 hours prior to surgery.  ? ?           ? ? Do not bring valuables to the hospital. [facility] NOT ?            RESPONSIBLE   FOR VALUABLES. ? ? Contacts, dentures or bridgework may not be worn into surgery. ? ? Patients discharged on the day of surgery will not be allowed to drive home.  Someone NEEDS to stay with you for the first 24 hours after anesthesia. ? ? Special Instructions: Bring a copy of your healthcare power of attorney and living will documents the day of surgery if you haven't scanned them before. ? ?            Please read over the following fact sheets you were given: IF [place] [phone] ? ?   [place] - Preparing for Surgery ?Before surgery, you can play an important role.  Because skin is not sterile, your skin needs to be as free of germs as possible.  You can reduce the number of germs on your skin by washing with CHG (chlorahexidine gluconate) soap before surgery.  CHG is an antiseptic cleaner which kills germs and bonds with the skin to continue killing germs even after washing. ?Please DO NOT use if you have an allergy to CHG or antibacterial soaps.  If your skin becomes reddened/irritated stop using the CHG and inform your nurse when you arrive at Short Stay. ?Do not shave (including legs and underarms) for at least 48 hours prior to the first CHG shower.  ?Please follow these instructions carefully: ? 1.  Shower with CHG Soap the night before surgery and the  morning of Surgery. ? 2.  If you choose to wash your hair, wash your hair first as usual with your  normal  shampoo. ? 3.  After you shampoo, rinse your hair and body thoroughly to remove the  shampoo.                        ?    4.  Use CHG as you  would any other liquid soap.  You can apply chg directly  to the skin and wash  ?                     Gently with a scrungie or clean washcloth. ? 5.  Apply the CHG Soap to your body ONLY FROM THE NECK DOWN.   Do not use on face/ open      ?                     Wound or open sores. Avoid contact with eyes, ears mouth and genitals (private parts).  ?                     Production manager,  Genitals (private parts) with your normal soap. ?            6.  Wash thoroughly, paying special attention to the area where your surgery  will be performed. ? 7.  Thoroughly rinse your body with warm water from the neck down. ? 8.  DO NOT shower/wash with your normal soap after using and rinsing off  the CHG Soap. ?  9.  Pat yourself dry with a clean towel. ?           10.  Wear clean pajamas. ?           11.  Place clean sheets on your bed the night of your first shower and do not  sleep with pets. ?Day of Surgery : ?Do not apply any lotions/deodorants the morning of surgery.  Please wear clean clothes to the hospital/surgery center. ? ?FAILURE TO FOLLOW THESE INSTRUCTIONS MAY RESULT IN THE CANCELLATION OF YOUR SURGERY ? ? ?________________________________________________________________________  ?

## 2021-07-23 ENCOUNTER — Encounter (HOSPITAL_COMMUNITY)
Admission: RE | Admit: 2021-07-23 | Discharge: 2021-07-23 | Disposition: A | Discharge status: Home / Self Care | Payer: Medicare Other | Source: Ambulatory Visit | Attending: Surgery | Admitting: Surgery

## 2021-07-23 ENCOUNTER — Encounter (HOSPITAL_COMMUNITY): Payer: Self-pay

## 2021-07-23 ENCOUNTER — Other Ambulatory Visit: Payer: Self-pay

## 2021-07-23 VITALS — BP 123/59 | HR 61 | Temp 97.8°F | Resp 18 | Ht 66.0 in | Wt 158.0 lb

## 2021-07-23 DIAGNOSIS — K769 Liver disease, unspecified: Secondary | ICD-10-CM

## 2021-07-23 DIAGNOSIS — K7210 Chronic hepatic failure without coma: Secondary | ICD-10-CM | POA: Insufficient documentation

## 2021-07-23 DIAGNOSIS — K7290 Hepatic failure, unspecified without coma: Secondary | ICD-10-CM | POA: Insufficient documentation

## 2021-07-23 DIAGNOSIS — Z01812 Encounter for preprocedural laboratory examination: Secondary | ICD-10-CM | POA: Insufficient documentation

## 2021-07-23 DIAGNOSIS — Z01818 Encounter for other preprocedural examination: Secondary | ICD-10-CM

## 2021-07-23 HISTORY — DX: Atherosclerotic heart disease of native coronary artery without angina pectoris: I25.10

## 2021-07-23 HISTORY — DX: Essential (primary) hypertension: I10

## 2021-07-23 LAB — CBC WITH DIFFERENTIAL/PLATELET
Abs Immature Granulocytes: 0.01 10*3/uL (ref 0.00–0.07)
Basophils Absolute: 0 10*3/uL (ref 0.0–0.1)
Basophils Relative: 1 %
Eosinophils Absolute: 0.2 10*3/uL (ref 0.0–0.5)
Eosinophils Relative: 5 %
HCT: 38.7 % (ref 36.0–46.0)
Hemoglobin: 13.2 g/dL (ref 12.0–15.0)
Immature Granulocytes: 0 %
Lymphocytes Relative: 31 %
Lymphs Abs: 1.4 10*3/uL (ref 0.7–4.0)
MCH: 30.1 pg (ref 26.0–34.0)
MCHC: 34.1 g/dL (ref 30.0–36.0)
MCV: 88.4 fL (ref 80.0–100.0)
Monocytes Absolute: 0.5 10*3/uL (ref 0.1–1.0)
Monocytes Relative: 10 %
Neutro Abs: 2.4 10*3/uL (ref 1.7–7.7)
Neutrophils Relative %: 53 %
Platelets: 218 10*3/uL (ref 150–400)
RBC: 4.38 MIL/uL (ref 3.87–5.11)
RDW: 13.4 % (ref 11.5–15.5)
WBC: 4.6 10*3/uL (ref 4.0–10.5)
nRBC: 0 % (ref 0.0–0.2)

## 2021-07-23 LAB — APTT: aPTT: 33 seconds (ref 24–36)

## 2021-07-23 LAB — PROTIME-INR
INR: 1.1 (ref 0.8–1.2)
Prothrombin Time: 13.8 seconds (ref 11.4–15.2)

## 2021-07-23 NOTE — Progress Notes (Signed)
Anesthesia note: ? ?Bowel prep reminder:Bowel prep instructions reviewed with Pt ? ?PCP - Dr. Oneida Arenas ?Cardiologist -Dr. Osborne Oman ?Other-  ? ?Chest x-ray - no ?EKG - 06/18/21-epic ?Stress Test - 2016 ?ECHO - 07/05/20-epic ?Cardiac Cath - no ? ?Pacemaker/ICD device last checked:NA ? ?Sleep Study - no ?CPAP -  ? ?Pt is pre diabetic-NA ?Fasting Blood Sugar -  ?Checks Blood Sugar _____ ? ?Blood Thinner: ?Blood Thinner Instructions: ?Aspirin Instructions:none Pt was told to call Dr. Dema Severin or her cardiologist to ask ?Last Dose:she wants to hold it starting 07/24/21 ? ?Anesthesia review: yes ? ?Patient denies shortness of breath, fever, cough and chest pain at PAT appointment ?Pt has no SOB with activities. She goes to the gym daily.  ?She will come to the hospital by transport. She has no one to stay with her post op. I told her the policy for OP discharge and that it is for her safety. ? ?Patient verbalized understanding of instructions that were given to them at the PAT appointment. Patient was also instructed that they will need to review over the PAT instructions again at home before surgery. yes ?

## 2021-07-30 ENCOUNTER — Encounter (HOSPITAL_COMMUNITY): Payer: Self-pay | Admitting: Surgery

## 2021-07-31 ENCOUNTER — Encounter (HOSPITAL_COMMUNITY): Payer: Self-pay | Admitting: Surgery

## 2021-07-31 ENCOUNTER — Ambulatory Visit (HOSPITAL_COMMUNITY): Payer: Medicare Other | Admitting: Anesthesiology

## 2021-07-31 ENCOUNTER — Other Ambulatory Visit: Payer: Self-pay

## 2021-07-31 ENCOUNTER — Ambulatory Visit (HOSPITAL_BASED_OUTPATIENT_CLINIC_OR_DEPARTMENT_OTHER): Payer: Medicare Other | Admitting: Anesthesiology

## 2021-07-31 ENCOUNTER — Encounter (HOSPITAL_COMMUNITY)
Admission: RE | Disposition: A | Discharge status: Home / Self Care | Payer: Self-pay | Source: Ambulatory Visit | Attending: Surgery

## 2021-07-31 ENCOUNTER — Observation Stay (HOSPITAL_COMMUNITY)
Admission: RE | Admit: 2021-07-31 | Discharge: 2021-08-01 | Disposition: A | Discharge status: Home / Self Care | Payer: Medicare Other | Source: Ambulatory Visit | Attending: Surgery | Admitting: Surgery

## 2021-07-31 DIAGNOSIS — K7460 Unspecified cirrhosis of liver: Secondary | ICD-10-CM

## 2021-07-31 DIAGNOSIS — Z01818 Encounter for other preprocedural examination: Secondary | ICD-10-CM

## 2021-07-31 DIAGNOSIS — K769 Liver disease, unspecified: Secondary | ICD-10-CM

## 2021-07-31 DIAGNOSIS — I8510 Secondary esophageal varices without bleeding: Secondary | ICD-10-CM | POA: Diagnosis not present

## 2021-07-31 DIAGNOSIS — D128 Benign neoplasm of rectum: Secondary | ICD-10-CM | POA: Diagnosis not present

## 2021-07-31 DIAGNOSIS — F17210 Nicotine dependence, cigarettes, uncomplicated: Secondary | ICD-10-CM | POA: Insufficient documentation

## 2021-07-31 DIAGNOSIS — I10 Essential (primary) hypertension: Secondary | ICD-10-CM

## 2021-07-31 DIAGNOSIS — K6289 Other specified diseases of anus and rectum: Secondary | ICD-10-CM | POA: Diagnosis not present

## 2021-07-31 DIAGNOSIS — K621 Rectal polyp: Secondary | ICD-10-CM | POA: Diagnosis not present

## 2021-07-31 DIAGNOSIS — I2510 Atherosclerotic heart disease of native coronary artery without angina pectoris: Secondary | ICD-10-CM

## 2021-07-31 DIAGNOSIS — Z8719 Personal history of other diseases of the digestive system: Secondary | ICD-10-CM

## 2021-07-31 HISTORY — PX: TRANSANAL EXCISION OF RECTAL MASS: SHX6134

## 2021-07-31 LAB — COMPREHENSIVE METABOLIC PANEL
ALT: 19 U/L (ref 0–44)
AST: 27 U/L (ref 15–41)
Albumin: 4.5 g/dL (ref 3.5–5.0)
Alkaline Phosphatase: 49 U/L (ref 38–126)
Anion gap: 9 (ref 5–15)
BUN: 12 mg/dL (ref 8–23)
CO2: 25 mmol/L (ref 22–32)
Calcium: 9.7 mg/dL (ref 8.9–10.3)
Chloride: 105 mmol/L (ref 98–111)
Creatinine, Ser: 0.96 mg/dL (ref 0.44–1.00)
GFR, Estimated: >60 mL/min (ref >60)
Glucose, Bld: 103 mg/dL — ABNORMAL HIGH (ref 70–99)
Potassium: 3.6 mmol/L (ref 3.5–5.1)
Sodium: 139 mmol/L (ref 135–145)
Total Bilirubin: 1.2 mg/dL (ref 0.3–1.2)
Total Protein: 7.1 g/dL (ref 6.5–8.1)

## 2021-07-31 LAB — TYPE AND SCREEN
ABO/RH(D): O POS
Antibody Screen: NEGATIVE

## 2021-07-31 LAB — ABO/RH: ABO/RH(D): O POS

## 2021-07-31 SURGERY — EXCISION, MASS, RECTUM, ANAL APPROACH
Anesthesia: General

## 2021-07-31 MED ORDER — LACTATED RINGERS IV SOLN: INTRAVENOUS | Status: DC

## 2021-07-31 MED ORDER — OXYCODONE HCL 5 MG PO TABS: 5.0000 mg | ORAL_TABLET | Freq: Once | ORAL | Status: DC | PRN

## 2021-07-31 MED ORDER — LORAZEPAM 2 MG/ML IJ SOLN
0.5000 mg | INTRAMUSCULAR | Status: DC | PRN
  Filled 2021-07-31: qty 1

## 2021-07-31 MED ORDER — OXYCODONE HCL 5 MG/5ML PO SOLN: 5.0000 mg | Freq: Once | ORAL | Status: DC | PRN

## 2021-07-31 MED ORDER — BUPIVACAINE LIPOSOME 1.3 % IJ SUSP
INTRAMUSCULAR | Status: AC
  Filled 2021-07-31: qty 20

## 2021-07-31 MED ORDER — PHENYLEPHRINE HCL-NACL 20-0.9 MG/250ML-% IV SOLN
INTRAVENOUS | Status: AC
  Filled 2021-07-31: qty 750

## 2021-07-31 MED ORDER — IBUPROFEN 400 MG PO TABS: 600.0000 mg | ORAL_TABLET | Freq: Four times a day (QID) | ORAL | Status: DC | PRN

## 2021-07-31 MED ORDER — EPHEDRINE SULFATE-NACL 50-0.9 MG/10ML-% IV SOSY
PREFILLED_SYRINGE | INTRAVENOUS | Status: DC | PRN
  Administered 2021-07-31 (×2): 5 mg via INTRAVENOUS

## 2021-07-31 MED ORDER — GABAPENTIN 100 MG PO CAPS: 200.0000 mg | ORAL_CAPSULE | Freq: Every day | ORAL | Status: DC

## 2021-07-31 MED ORDER — MENTHOL 3 MG MT LOZG
1.0000 | LOZENGE | OROMUCOSAL | Status: DC | PRN
  Filled 2021-07-31: qty 9

## 2021-07-31 MED ORDER — POLYETHYLENE GLYCOL 3350 17 G PO PACK: 17.0000 g | PACK | Freq: Every day | ORAL | Status: DC | PRN

## 2021-07-31 MED ORDER — LACTULOSE 10 GM/15ML PO SOLN: 10.0000 g | Freq: Every day | ORAL | Status: DC

## 2021-07-31 MED ORDER — TRAMADOL HCL 50 MG PO TABS: 50.0000 mg | ORAL_TABLET | Freq: Four times a day (QID) | ORAL | Status: DC | PRN

## 2021-07-31 MED ORDER — ORAL CARE MOUTH RINSE: 15.0000 mL | Freq: Once | OROMUCOSAL | Status: AC

## 2021-07-31 MED ORDER — ONDANSETRON 4 MG PO TBDP: 4.0000 mg | ORAL_TABLET | Freq: Four times a day (QID) | ORAL | Status: DC | PRN

## 2021-07-31 MED ORDER — DIPHENHYDRAMINE HCL 50 MG/ML IJ SOLN: 12.5000 mg | Freq: Four times a day (QID) | INTRAMUSCULAR | Status: DC | PRN

## 2021-07-31 MED ORDER — MELATONIN 3 MG PO TABS: 3.0000 mg | ORAL_TABLET | Freq: Every evening | ORAL | Status: DC | PRN

## 2021-07-31 MED ORDER — NADOLOL 20 MG PO TABS
10.0000 mg | ORAL_TABLET | Freq: Every day | ORAL | Status: DC
  Filled 2021-07-31: qty 1

## 2021-07-31 MED ORDER — FLEET ENEMA 7-19 GM/118ML RE ENEM: 1.0000 | ENEMA | Freq: Once | RECTAL | Status: DC

## 2021-07-31 MED ORDER — ONDANSETRON HCL 4 MG/2ML IJ SOLN: 4.0000 mg | Freq: Four times a day (QID) | INTRAMUSCULAR | Status: DC | PRN

## 2021-07-31 MED ORDER — ESMOLOL HCL 100 MG/10ML IV SOLN
INTRAVENOUS | Status: AC
  Filled 2021-07-31: qty 10

## 2021-07-31 MED ORDER — EPHEDRINE 5 MG/ML INJ
INTRAVENOUS | Status: AC
  Filled 2021-07-31: qty 5

## 2021-07-31 MED ORDER — ACETAMINOPHEN 500 MG PO TABS
1000.0000 mg | ORAL_TABLET | ORAL | Status: AC
  Administered 2021-07-31: 1000 mg via ORAL
  Filled 2021-07-31: qty 2

## 2021-07-31 MED ORDER — BUPIVACAINE-EPINEPHRINE (PF) 0.25% -1:200000 IJ SOLN
INTRAMUSCULAR | Status: AC
  Filled 2021-07-31: qty 30

## 2021-07-31 MED ORDER — SPIRONOLACTONE 25 MG PO TABS
50.0000 mg | ORAL_TABLET | Freq: Every day | ORAL | Status: DC
  Filled 2021-07-31: qty 2

## 2021-07-31 MED ORDER — BUPIVACAINE LIPOSOME 1.3 % IJ SUSP
INTRAMUSCULAR | Status: DC | PRN
  Administered 2021-07-31: 20 mL

## 2021-07-31 MED ORDER — FUROSEMIDE 20 MG PO TABS: 20.0000 mg | ORAL_TABLET | Freq: Every day | ORAL | Status: DC

## 2021-07-31 MED ORDER — ROCURONIUM BROMIDE 10 MG/ML (PF) SYRINGE
PREFILLED_SYRINGE | INTRAVENOUS | Status: DC | PRN
  Administered 2021-07-31: 50 mg via INTRAVENOUS

## 2021-07-31 MED ORDER — 0.9 % SODIUM CHLORIDE (POUR BTL) OPTIME
TOPICAL | Status: DC | PRN
  Administered 2021-07-31: 1000 mL

## 2021-07-31 MED ORDER — ACETAMINOPHEN 325 MG PO TABS: 650.0000 mg | ORAL_TABLET | Freq: Four times a day (QID) | ORAL | Status: DC

## 2021-07-31 MED ORDER — PROPOFOL 10 MG/ML IV BOLUS
INTRAVENOUS | Status: DC | PRN
  Administered 2021-07-31: 130 mg via INTRAVENOUS

## 2021-07-31 MED ORDER — FENTANYL CITRATE (PF) 100 MCG/2ML IJ SOLN
INTRAMUSCULAR | Status: DC | PRN
  Administered 2021-07-31: 100 ug via INTRAVENOUS

## 2021-07-31 MED ORDER — SODIUM CHLORIDE 0.9 % IV SOLN
2.0000 g | INTRAVENOUS | Status: AC
  Administered 2021-07-31: 2 g via INTRAVENOUS
  Filled 2021-07-31: qty 2

## 2021-07-31 MED ORDER — BUPIVACAINE-EPINEPHRINE 0.25% -1:200000 IJ SOLN
INTRAMUSCULAR | Status: DC | PRN
  Administered 2021-07-31: 20 mL

## 2021-07-31 MED ORDER — BUPIVACAINE LIPOSOME 1.3 % IJ SUSP
20.0000 mL | Freq: Once | INTRAMUSCULAR | Status: DC
Start: 2021-07-31 — End: 2021-07-31

## 2021-07-31 MED ORDER — SUGAMMADEX SODIUM 200 MG/2ML IV SOLN
INTRAVENOUS | Status: DC | PRN
  Administered 2021-07-31: 200 mg via INTRAVENOUS

## 2021-07-31 MED ORDER — RIFAXIMIN 550 MG PO TABS: 550.0000 mg | ORAL_TABLET | Freq: Two times a day (BID) | ORAL | Status: DC

## 2021-07-31 MED ORDER — CHLORHEXIDINE GLUCONATE 0.12 % MT SOLN
15.0000 mL | Freq: Once | OROMUCOSAL | Status: AC
  Administered 2021-07-31: 15 mL via OROMUCOSAL

## 2021-07-31 MED ORDER — DIPHENHYDRAMINE HCL 12.5 MG/5ML PO ELIX: 12.5000 mg | ORAL_SOLUTION | Freq: Four times a day (QID) | ORAL | Status: DC | PRN

## 2021-07-31 MED ORDER — ONDANSETRON HCL 4 MG/2ML IJ SOLN
INTRAMUSCULAR | Status: DC | PRN
  Administered 2021-07-31: 4 mg via INTRAVENOUS

## 2021-07-31 MED ORDER — HYDRALAZINE HCL 20 MG/ML IJ SOLN: 10.0000 mg | INTRAMUSCULAR | Status: DC | PRN

## 2021-07-31 MED ORDER — FENTANYL CITRATE PF 50 MCG/ML IJ SOSY: 25.0000 ug | PREFILLED_SYRINGE | INTRAMUSCULAR | Status: DC | PRN

## 2021-07-31 MED ORDER — FENTANYL CITRATE (PF) 100 MCG/2ML IJ SOLN
INTRAMUSCULAR | Status: AC
  Filled 2021-07-31: qty 2

## 2021-07-31 MED ORDER — SIMETHICONE 80 MG PO CHEW: 40.0000 mg | CHEWABLE_TABLET | Freq: Four times a day (QID) | ORAL | Status: DC | PRN

## 2021-07-31 MED ORDER — PHENOL 1.4 % MT LIQD
1.0000 | OROMUCOSAL | Status: DC | PRN
  Filled 2021-07-31: qty 177

## 2021-07-31 MED ORDER — LIDOCAINE 2% (20 MG/ML) 5 ML SYRINGE
INTRAMUSCULAR | Status: DC | PRN
Start: 2021-07-31 — End: 2021-07-31
  Administered 2021-07-31: 100 mg via INTRAVENOUS

## 2021-07-31 MED ORDER — DEXAMETHASONE SODIUM PHOSPHATE 10 MG/ML IJ SOLN
INTRAMUSCULAR | Status: DC | PRN
Start: 2021-07-31 — End: 2021-07-31
  Administered 2021-07-31: 6 mg via INTRAVENOUS

## 2021-07-31 SURGICAL SUPPLY — 44 items
BAG COUNTER SPONGE SURGICOUNT (BAG) IMPLANT
BAG SPNG CNTER NS LX DISP (BAG)
BAG URO CATCHER STRL LF (MISCELLANEOUS) IMPLANT
BLADE EXTENDED COATED 6.5IN (ELECTRODE) IMPLANT
CABLE HIGH FREQUENCY MONO STRZ (ELECTRODE) IMPLANT
DISSECTOR SURG LIGASURE 21 (MISCELLANEOUS) ×1 IMPLANT
DRAPE LAPAROTOMY T 102X78X121 (DRAPES) ×2 IMPLANT
DRAPE SURG IRRIG POUCH 19X23 (DRAPES) ×1 IMPLANT
ELECT REM PT RETURN 15FT ADLT (MISCELLANEOUS) ×2 IMPLANT
GAUZE 4X4 16PLY ~~LOC~~+RFID DBL (SPONGE) IMPLANT
GAUZE SPONGE 4X4 12PLY STRL (GAUZE/BANDAGES/DRESSINGS) IMPLANT
GLOVE BIO SURGEON STRL SZ 6.5 (GLOVE) ×2 IMPLANT
GLOVE BIOGEL PI IND STRL 7.0 (GLOVE) ×1 IMPLANT
GLOVE BIOGEL PI INDICATOR 7.0 (GLOVE) ×1
GLOVE INDICATOR 6.5 STRL GRN (GLOVE) ×2 IMPLANT
GOWN STRL REUS W/ TWL XL LVL3 (GOWN DISPOSABLE) ×2 IMPLANT
GOWN STRL REUS W/TWL XL LVL3 (GOWN DISPOSABLE) ×4
IRRIG SUCT STRYKERFLOW 2 WTIP (MISCELLANEOUS)
IRRIGATION SUCT STRKRFLW 2 WTP (MISCELLANEOUS) ×1 IMPLANT
KIT BASIN OR (CUSTOM PROCEDURE TRAY) ×2 IMPLANT
KIT TURNOVER KIT A (KITS) IMPLANT
LEGGING LITHOTOMY PAIR STRL (DRAPES) ×1 IMPLANT
MANIFOLD NEPTUNE II (INSTRUMENTS) ×2 IMPLANT
NS IRRIG 1000ML POUR BTL (IV SOLUTION) ×2 IMPLANT
PANTS MESH DISP LRG (UNDERPADS AND DIAPERS) IMPLANT
PANTS MESH DISPOSABLE L (UNDERPADS AND DIAPERS) ×1
PENCIL SMOKE EVACUATOR (MISCELLANEOUS) IMPLANT
PLATFORM TRANSANAL ACCESS 4X5 (MISCELLANEOUS) IMPLANT
PLATFORM TRANSANAL ACCESS 4X5. (MISCELLANEOUS)
RETRACTOR LONE STAR DISPOSABLE (INSTRUMENTS) IMPLANT
RETRACTOR STAY HOOK 5MM (MISCELLANEOUS) IMPLANT
SET TUBE SMOKE EVAC HIGH FLOW (TUBING) ×2 IMPLANT
SHEARS HARMONIC ACE PLUS 36CM (ENDOMECHANICALS) ×1 IMPLANT
SPONGE T-LAP 18X18 ~~LOC~~+RFID (SPONGE) ×1 IMPLANT
SURGILUBE 2OZ TUBE FLIPTOP (MISCELLANEOUS) ×2 IMPLANT
SUT V-LOC BARB 180 2/0GR6 GS22 (SUTURE)
SUT VIC AB 2-0 SH 27 (SUTURE)
SUT VIC AB 2-0 SH 27X BRD (SUTURE) IMPLANT
SUTURE V-LC BRB 180 2/0GR6GS22 (SUTURE) IMPLANT
TOWEL OR 17X26 10 PK STRL BLUE (TOWEL DISPOSABLE) ×2 IMPLANT
TOWEL OR NON WOVEN STRL DISP B (DISPOSABLE) ×2 IMPLANT
TRAY FOLEY MTR SLVR 16FR STAT (SET/KITS/TRAYS/PACK) ×1 IMPLANT
TRAY LAPAROSCOPIC (CUSTOM PROCEDURE TRAY) ×2 IMPLANT
TUBING CONNECTING 10 (TUBING) ×2 IMPLANT

## 2021-07-31 NOTE — Transfer of Care (Signed)
Immediate Anesthesia Transfer of Care Note ? ?Patient: Nicole Barr ? ?Procedure(s) Performed: Procedure(s): ?TRANSANAL EXCISION OF DISTAL RECTAL POLYPOID MASS (N/A) ? ?Patient Location: PACU ? ?Anesthesia Type:General ? ?Level of Consciousness:  sedated, patient cooperative and responds to stimulation ? ?Airway & Oxygen Therapy:Patient Spontanous Breathing and Patient connected to face mask oxgen ? ?Post-op Assessment:  Report given to PACU RN and Post -op Vital signs reviewed and stable ? ?Post vital signs:  Reviewed and stable ? ?Last Vitals:  ?Vitals:  ? 07/31/21 1200  ?BP: 123/62  ?Pulse: (!) 54  ?Resp: 18  ?Temp: 36.7 ?C  ?SpO2: 96%  ? ? ?Complications: No apparent anesthesia complications ? ?

## 2021-07-31 NOTE — Anesthesia Postprocedure Evaluation (Signed)
Anesthesia Post Note ? ?Patient: Nicole Barr ? ?Procedure(s) Performed: TRANSANAL EXCISION OF DISTAL RECTAL POLYPOID MASS ? ?  ? ?Patient location during evaluation: PACU ?Anesthesia Type: General ?Level of consciousness: awake and alert ?Pain management: pain level controlled ?Vital Signs Assessment: post-procedure vital signs reviewed and stable ?Respiratory status: spontaneous breathing, nonlabored ventilation and respiratory function stable ?Cardiovascular status: blood pressure returned to baseline ?Postop Assessment: no apparent nausea or vomiting ?Anesthetic complications: no ? ? ?No notable events documented. ? ?Last Vitals:  ?Vitals:  ? 07/31/21 1430 07/31/21 1445  ?BP: (!) 118/59 119/61  ?Pulse: 61 (!) 56  ?Resp: 10 15  ?Temp:    ?SpO2: 97% 96%  ?  ?Last Pain:  ?Vitals:  ? 07/31/21 1445  ?TempSrc:   ?PainSc: 0-No pain  ? ? ?  ?  ?  ?  ?  ?  ? ?Marthenia Rolling ? ? ? ? ?

## 2021-07-31 NOTE — Anesthesia Procedure Notes (Signed)
Procedure Name: Intubation ?Date/Time: 07/31/2021 1:42 PM ?Performed by: Lavina Hamman, CRNA ?Pre-anesthesia Checklist: Patient identified, Emergency Drugs available, Suction available, Patient being monitored and Timeout performed ?Patient Re-evaluated:Patient Re-evaluated prior to induction ?Oxygen Delivery Method: Circle system utilized ?Preoxygenation: Pre-oxygenation with 100% oxygen ?Induction Type: IV induction ?Ventilation: Mask ventilation without difficulty ?Laryngoscope Size: Mac and 4 ?Grade View: Grade II ?Tube type: Oral ?Tube size: 7.0 mm ?Number of attempts: 1 ?Airway Equipment and Method: Stylet ?Placement Confirmation: ETT inserted through vocal cords under direct vision, positive ETCO2, CO2 detector and breath sounds checked- equal and bilateral ?Secured at: 22 cm ?Tube secured with: Tape ?Dental Injury: Teeth and Oropharynx as per pre-operative assessment  ?Comments: ATOI ? ? ? ? ?

## 2021-07-31 NOTE — Anesthesia Preprocedure Evaluation (Addendum)
Anesthesia Evaluation  ?Patient identified by MRN, date of birth, ID band ?Patient awake ? ? ? ?Reviewed: ?Allergy & Precautions, NPO status , Patient's Chart, lab work & pertinent test results, reviewed documented beta blocker date and time  ? ?History of Anesthesia Complications ?Negative for: history of anesthetic complications ? ?Airway ?Mallampati: II ? ?TM Distance: >3 FB ?Neck ROM: Full ? ? ? Dental ?no notable dental hx. ? ?  ?Pulmonary ?Current Smoker and Patient abstained from smoking.,  ?  ?Pulmonary exam normal ? ? ? ? ? ? ? Cardiovascular ?hypertension, Pt. on home beta blockers and Pt. on medications ?+ CAD  ?Normal cardiovascular exam ? ? ?  ?Neuro/Psych ?Depression negative neurological ROS ?   ? GI/Hepatic ?GERD  ,(+) Cirrhosis  ? Esophageal Varices ?  ? , Rectal mass ?  ?Endo/Other  ?negative endocrine ROS ? Renal/GU ?negative Renal ROS  ?negative genitourinary ?  ?Musculoskeletal ?negative musculoskeletal ROS ?(+)  ? Abdominal ?  ?Peds ? Hematology ?negative hematology ROS ?(+)   ?Anesthesia Other Findings ?Day of surgery medications reviewed with patient. ? Reproductive/Obstetrics ?negative OB ROS ? ?  ? ? ? ? ? ? ? ? ? ? ? ? ? ?  ?  ? ? ? ? ? ? ? ?Anesthesia Physical ?Anesthesia Plan ? ?ASA: 3 ? ?Anesthesia Plan: General  ? ?Post-op Pain Management:   ? ?Induction: Intravenous ? ?PONV Risk Score and Plan: 3 and Treatment may vary due to age or medical condition, Ondansetron and Dexamethasone ? ?Airway Management Planned: Oral ETT ? ?Additional Equipment: None ? ?Intra-op Plan:  ? ?Post-operative Plan: Extubation in OR ? ?Informed Consent: I have reviewed the patients History and Physical, chart, labs and discussed the procedure including the risks, benefits and alternatives for the proposed anesthesia with the patient or authorized representative who has indicated his/her understanding and acceptance.  ? ? ? ?Dental advisory given ? ?Plan Discussed with:  CRNA ? ?Anesthesia Plan Comments:   ? ? ? ? ?Anesthesia Quick Evaluation ? ?

## 2021-07-31 NOTE — Op Note (Signed)
07/31/2021 ? ?2:10 PM ? ?PATIENT:  Nicole Barr  76 y.o. female ? ?Patient Care Team: ?Gaynelle Arabian, MD as PCP - General (Family Medicine) ?Werner Lean, MD as PCP - Cardiology (Cardiology) ? ?PRE-OPERATIVE DIAGNOSIS:  Distal rectal polyp ? ?POST-OPERATIVE DIAGNOSIS:  Same ? ?PROCEDURE:   ?Trans-anal excision under anoscopic guidance of distal rectal polyp ?Anorectal exam under anesthesia ? ?SURGEON:  Surgeon(s): ?Ileana Roup, MD ? ?ASSISTANT: OR staff  ? ?ANESTHESIA:   local and general ? ?SPECIMEN:  Distal rectal polyp - left anterior ? ?DISPOSITION OF SPECIMEN:  PATHOLOGY ? ?COUNTS:  Sponge, needle, and instrument counts were reported correct x2 at conclusion. ? ?EBL: 2 mL ? ?Drains: None ? ?PLAN OF CARE: Admit for overnight observation - she has nobody to stay with her tonight; no friends or family to contact. ? ?PATIENT DISPOSITION:  PACU - hemodynamically stable. ? ?OR FINDINGS: Left anterior distal rectal polyp which is friable and on a stalk/pedicle. No other distal rectal or anal canal findings to note. This was removed in 1 piece and submitted to pathology. ? ?DESCRIPTION: ?The patient was identified in the preoperative holding area and taken to the OR. SCDs were applied. She then underwent general endotracheal anesthesia without difficulty. The patient was then rolled onto the OR table in the prone jackknife position. Pressure points were then evaluated and padded. Benzoin was applied to the buttocks and they were gently taped apart.  She was then prepped and draped in usual sterile fashion.  A surgical timeout was performed indicating the correct patient, procedure, and positioning.  A perianal block was then created using a dilute mixture of 0.25% Marcaine with epinephrine and Exparel. ? ?After ascertaining an appropriate level of anesthesia had been achieved, a well lubricated digital rectal exam was performed. This demonstrated palpable soft mobile polypoid lesion in the  left anterior rectum.  No other palpable masses.  A Hill-Ferguson anoscope was into the anal canal and circumferential inspection demonstrated mobile polyp that is approximately 2 x 2 cm cm in size and on a pedicle with a narrow base.  This was elevated with a DeBakey forcep under anoscopic visualization. The base including a small cuff of normal rectum is scored and dissected from underlying muscle. This is then ligated and divided with a Ligasure device. This was passed off as specimen. Hemostasis is noted. To ensure no further issues with bleeding, 2 additional 0 vicryl figure-of-eight sutures are placed at the base. The rectum is irrigated and hemostasis verified on circumferential inspection.  All sponge, needle, and instrument counts were reported correct.  The buttocks were then untaped.  She was rolled back onto a stretcher, awakened anesthesia, extubated, and transported to PACU in satisfactory condition. ? ?DISPOSITION: PACU in satisfactory condition. ?

## 2021-07-31 NOTE — H&P (Signed)
? ?CC: Here today for surgery ? ?HPI: ?Nicole Barr is an 76 y.o. female hx of alcoholic cirrhosis now resolved, esophageal varices, prior encephalopathy, prior umbilical hernia - saw my partner Dr. Johney Maine 02/2021 for presumed hemorrhoids. Conservative measures were recommended due to underlying health issues. She came to see [place] for intractable symptoms now and ongoing bleeding ? ?She reports 0 alcohol intake since 2014. ? ?Reports significant rectal bleeding that occurs for hours at a time. Reports that she has 2 soft bowel movements per day and spends no more than 1 to 2 minutes on the commode. She drinks approximately 1 to 2 L of water per day. She takes daily MiraLAX as well as some lactulose but has come down on the lactulose. She denies any diarrhea or constipation. She will occasionally note something prolapsing tissue from the anus that she will have to push back up. ? ?Labs from 1 year ago demonstrated creatinine 1.1, albumin 4.3. AST ALT normal. Total bilirubin 0.8. ? ?Colonoscopy pathology from 03/05/2020 showed tubular adenomas in the rectum and ascending colon. We do not have a physical copy of the colonoscopy report. ? ?PMH: alcoholic cirrhosis with massive ascites/decompensated cirrhosis, esophageal varices, prior encephalopathy, prior umbilical hernia  ? ?PSH: Umb hernia repair; hemorrhoid banding remotely ? ?FHx: Denies any known family history of colorectal, breast, endometrial or ovarian cancer ? ?Social Hx: Denies use of tobacco/EtOH/illicit drug. She is retired. She reports that she has not had a drink of alcohol since 2014.  ? ?Past Medical History:  ?Diagnosis Date  ? Alcoholic cirrhosis ([place])   ? Ascites   ? Coronary artery disease   ? Endometriosis   ? GERD (gastroesophageal reflux disease)   ? Hypertension   ? Liver cyst   ? ? ?Past Surgical History:  ?Procedure Laterality Date  ? CAROTID ENDARTERECTOMY Right 04/07/2006  ? egd with banding  04/07/2014  ? with bleeding after wards  done at [facility]  ? ESOPHAGOGASTRODUODENOSCOPY (EGD) WITH PROPOFOL N/A 05/02/2015  ? Procedure: ESOPHAGOGASTRODUODENOSCOPY (EGD) WITH PROPOFOL;  Surgeon: Arta Silence, MD;  Location: [facility];  Service: Endoscopy;  Laterality: N/A;  ? ESOPHAGOGASTRODUODENOSCOPY (EGD) WITH PROPOFOL N/A 06/06/2015  ? Procedure: ESOPHAGOGASTRODUODENOSCOPY (EGD) WITH PROPOFOL;  Surgeon: Arta Silence, MD;  Location: [facility];  Service: Endoscopy;  Laterality: N/A;  ? GASTRIC VARICES BANDING N/A 05/02/2015  ? Procedure: GASTRIC VARICES BANDING;  Surgeon: Arta Silence, MD;  Location: [facility];  Service: Endoscopy;  Laterality: N/A;  ? HEMORROIDECTOMY  2008  ? banding  ? LAMINECTOMY  04/08/1987  ? lumbar 4-5  ? LAPAROSCOPY  04/08/1987  ? adhesions  ? oophrectomy Left 04/08/1987  ? TONSILLECTOMY    ? age 41  ? UMBILICAL HERNIA REPAIR  2020  ? ? ?Family History  ?Problem Relation Age of Onset  ? Diabetes Mother   ? Cancer Mother   ? Hypertension Mother   ? Hyperlipidemia Mother   ? Lupus Mother   ? Vitiligo Mother   ? Heart failure Father   ? Heart failure Maternal Grandmother   ? ? ?Social:  reports that she has been smoking cigarettes. She started smoking about 53 years ago. She has a 8.75 pack-year smoking history. She has never used smokeless tobacco. She reports that she does not drink alcohol and does not use drugs. ? ?Allergies:  ?Allergies  ?Allergen Reactions  ? Carvedilol Other (See Comments)  ?  Hypertension (intolerance), Rapid Heart Rate ?Increases BP to 300 - per pt ?  ? Doxepin Hcl   ?  Other reaction(s): does not do anything for her  ? Trazodone Hcl   ?  Other reaction(s): in effective for sleep (2016)  ? ? ?Medications: I have reviewed the patient's current medications. ? ?No results found for this or any previous visit (from the past 48 hour(s)). ? ?No results found. ? ?ROS - all of the below systems have been reviewed with the patient and positives are indicated with bold text ?General: chills, fever or  night sweats ?Eyes: blurry vision or double vision ?ENT: epistaxis or sore throat ?Allergy/Immunology: itchy/watery eyes or nasal congestion ?Hematologic/Lymphatic: bleeding problems, blood clots or swollen lymph nodes ?Endocrine: temperature intolerance or unexpected weight changes ?Breast: new or changing breast lumps or nipple discharge ?Resp: cough, shortness of breath, or wheezing ?CV: chest pain or dyspnea on exertion ?GI: as per HPI ?GU: dysuria, trouble voiding, or hematuria ?MSK: joint pain or joint stiffness ?Neuro: TIA or stroke symptoms ?Derm: pruritus and skin lesion changes ?Psych: anxiety and depression ? ?PE ?There were no vitals taken for this visit. ?Constitutional: NAD; conversant ?Eyes: Moist conjunctiva; no lid lag; anicteric ?Lungs: Normal respiratory effort ?CV: RRR; no pitting edema ?GI: Abd soft, NT/ND; no palpable hepatosplenomegaly ?(Historical note from office visit - Anorectal: Normal perianal skin with the exception of some bright red blood staining the perianal skin. No external hemorrhoids. DRE - normal tone/squeeze, ?polypoid mass mobile in distal rectum ?Anoscopy: Circumferential anoscopy demonstrates normal-appearing anoderm. No fissures or ulcerations. Small hemorrhoids. Within the distal rectum, there is an evident polypoid mass which is soft and mobile and approximately 1.5 x 1.5 cm in size. This does appear to be about the size of a marble. There is fresh blood adherent to the surface. This has mucosal type architecture appearance to it it appears like a classic polyp as opposed to an actual rectal varices.) ?MSK: Normal range of motion of extremities ?Psychiatric: Appropriate affect; alert and oriented x3 ? ?No results found for this or any previous visit (from the past 48 hour(s)). ? ?No results found. ? ?A/P: ?Nicole Barr is an 76 y.o. female with hx of alcoholic cirrhosis now resolved, esophageal varices, prior encephalopathy, prior umbilical hernia here for  evaluation of BRBPR - evident distal rectal polypoid mass on anoscopy - this prolapses into the anal canal and is mobile ? ?Based on her symptoms and appearance of this lesion, this would be amenable to a transanal excision. I believe it is too distal for a transanal minimally invasive surgery (TAMIS) ? ?-The anatomy and physiology of the anal canal was discussed with the patient with associated pictures. The pathophysiology of rectal polyps and masses was discussed at length with associated pictures and illustrations. ?-We have reviewed options going forward -this certainly appears like a classic adenoma and is likely the cause of her rectal bleeding. Biopsies may worsen her symptoms and would still likely require further excision. It does not feel fixed or hard like a more advanced rectal cancer. Therefore, we discussed possibility of a transanal excision, anorectal exam under anesthesia. ?-The planned procedures, material risks (including, but not limited to, pain, bleeding, infection, scarring, need for blood transfusion, damage to anal sphincter, incontinence of gas and/or stool, need for additional procedures, recurrence, pneumonia, heart attack, stroke, death) benefits and alternatives to surgery were discussed at length. The patient's questions were answered to her satisfaction, she voiced understanding and elected to proceed with surgery. Additionally, we discussed typical postoperative expectations and the recovery process. ? ?-Will ultimately need colonoscopy updated as well ? ?  Nadeen Landau, MD FACS ?[facility] ?

## 2021-08-01 ENCOUNTER — Other Ambulatory Visit (HOSPITAL_COMMUNITY): Payer: Self-pay

## 2021-08-01 ENCOUNTER — Encounter (HOSPITAL_COMMUNITY): Payer: Self-pay | Admitting: Surgery

## 2021-08-01 LAB — SURGICAL PATHOLOGY

## 2021-08-01 MED ORDER — TRAMADOL HCL 50 MG PO TABS: 50.0000 mg | ORAL_TABLET | Freq: Four times a day (QID) | ORAL | 0 refills | Status: DC | PRN

## 2021-08-01 MED ORDER — TRAMADOL HCL 50 MG PO TABS
50.0000 mg | ORAL_TABLET | Freq: Four times a day (QID) | ORAL | 0 refills | Status: AC | PRN
  Filled 2021-08-01: qty 5, 2d supply, fill #0

## 2021-08-01 NOTE — Progress Notes (Signed)
Discharge instructions discussed with patient verbalized agreement and understanding 

## 2021-08-01 NOTE — Discharge Instructions (Signed)
ANORECTAL SURGERY: POST OP INSTRUCTIONS ? ?DIET: Follow a light bland diet the first 24 hours after arrival home, such as soup, liquids, crackers, etc.  Be sure to include lots of fluids daily.  Avoid fast food or heavy meals as your are more likely to get nauseated.  Eat a low fat diet the next few days after surgery.   ?Some bleeding with bowel movements is expected for the first couple of days but this should stop in between bowel movements ? ?Take your usually prescribed home medications unless otherwise directed. ? ?PAIN CONTROL: ?It is helpful to take an over-the-counter pain medication regularly for the first few days/weeks.  Choose from the following that works best for you: ?Ibuprofen (Advil, etc) Three '200mg'$  tabs every 6 hours as needed. ?Acetaminophen (Tylenol, etc) 500-'650mg'$  every 6 hours as needed ?NOTE: You may take both of these medications together - most patients find it most helpful when alternating between the two (i.e. Ibuprofen at 6am, tylenol at 9am, ibuprofen at 12pm ...) ?A  prescription for pain medication may have been prescribed for you at discharge.  Take your pain medication as prescribed.  ?If you are having problems/concerns with the prescription medicine, please call us for further advice. ? ?Avoid getting constipated.  Between the surgery and the pain medications, it is common to experience some constipation.  Increasing fluid intake (64oz of water per day) and taking a fiber supplement (such as Metamucil, Citrucel, FiberCon) 1-2 times a day regularly will usually help prevent this problem from occurring.  Take Miralax (over the counter) 1-2x/day while taking a narcotic pain medication. If no bowel movement after 48hours, you may additionally take a laxative like a bottle of Milk of Magnesia which can be purchased over the counter. Avoid enemas if possible as these are often painful. ?  ?Watch out for diarrhea.  If you have many loose bowel movements, simplify your diet to bland  foods.  Stop any stool softeners and decrease your fiber supplement. If this worsens or does not improve, please call us. ? ?Wash / shower every day.  If you were discharged with a dressing, you may remove this the day after your surgery. You may shower normally, getting soap/water on your wound, particularly after bowel movements. ? ?Soaking in a warm bath filled a couple inches ("Sitz bath") is a great way to clean the area after a bowel movement and many patients find it is a way to soothe the area. ? ?ACTIVITIES as tolerated:   ?You may resume regular (light) daily activities beginning the next day--such as daily self-care, walking, climbing stairs--gradually increasing activities as tolerated.  If you can walk 30 minutes without difficulty, it is safe to try more intense activity such as jogging, treadmill, bicycling, low-impact aerobics, etc. ?Refrain from any heavy lifting or straining for the first 2 weeks after your procedure, particularly if your surgery was for hemorrhoids. ?Avoid activities that make your pain worse ?You may drive when you are no longer taking prescription pain medication, you can comfortably wear a seatbelt, and you can safely maneuver your car and apply brakes. ? ?FOLLOW UP in our office ?Please call [facility] at [phone] to set up an appointment to see your surgeon in the office for a follow-up appointment approximately 2 weeks after your surgery. ?Make sure that you call for this appointment the day you arrive home to insure a convenient appointment time. ? ?9. If you have disability or family leave forms that need to be completed,  you may have them completed by your primary care physician's office; for return to work instructions, please ask our office staff and they will be happy to assist you in obtaining this documentation ?  ?When to call us [phone]: ?Poor pain control ?Reactions / problems with new medications (rash/itching, etc)  ?Fever over 101.5 F (38.5 C) ?Inability  to urinate ?Nausea/vomiting ?Worsening swelling or bruising ?Continued bleeding from incision. ?Increased pain, redness, or drainage from the incision ? ?The clinic staff is available to answer your questions during regular business hours (8:30am-5pm).  Please don?t hesitate to call and ask to speak to one of our nurses for clinical concerns.   A surgeon from [facility] is always on call at the hospitals ?  ?If you have a medical emergency, go to the nearest emergency room or call 911. ?  ?[facility] ?[facility] ?[address] [place], [place], [place]  [address] ?MAIN: [phone] ?FAX: [phone] ?[email or web address] ?

## 2021-08-01 NOTE — Progress Notes (Signed)
?  Subjective ?No acute events. Feeling well. No bleeding. No pain ? ?Objective: ?Vital signs in last 24 hours: ?Temp:  [97.3 ?F (36.3 ?C)-98.4 ?F (36.9 ?C)] 97.6 ?F (36.4 ?C) (04/27 0510) ?Pulse Rate:  [52-73] 61 (04/27 0510) ?Resp:  [10-20] 18 (04/27 0510) ?BP: (112-157)/(48-109) 115/58 (04/27 0510) ?SpO2:  [96 %-99 %] 99 % (04/27 0510) ?Weight:  [71.7 kg] 71.7 kg (04/26 1200) ?Last BM Date : 07/31/21 ? ?Intake/Output from previous day: ?04/26 0701 - 04/27 0700 ?In: 2420 [P.O.:1320; I.V.:1000; IV Piggyback:100] ?Out: 2 [Blood:2] ?Intake/Output this shift: ?No intake/output data recorded. ? ?Gen: NAD, comfortable ?CV: RRR ?Pulm: Normal work of breathing ?Ext: SCDs in place ? ?Lab Results: ?CBC  ?No results for input(s): WBC, HGB, HCT, PLT in the last 72 hours. ?BMET ?Recent Labs  ?  07/31/21 ?1155  ?NA 139  ?K 3.6  ?CL 105  ?CO2 25  ?GLUCOSE 103*  ?BUN 12  ?CREATININE 0.96  ?CALCIUM 9.7  ? ?PT/INR ?No results for input(s): LABPROT, INR in the last 72 hours. ?ABG ?No results for input(s): PHART, HCO3 in the last 72 hours. ? ?Invalid input(s): PCO2, PO2 ? ?Studies/Results: ? ?Anti-infectives: ?Anti-infectives (From admission, onward)  ? ? Start     Dose/Rate Route Frequency Ordered Stop  ? 07/31/21 2200  rifaximin (XIFAXAN) tablet 550 mg       ? 550 mg Oral 2 times daily 07/31/21 1551    ? 07/31/21 1130  cefoTEtan (CEFOTAN) 2 g in sodium chloride 0.9 % 100 mL IVPB       ? 2 g ?200 mL/hr over 30 Minutes Intravenous On call to O.R. 07/31/21 1127 07/31/21 1330  ? ?  ? ? ? ?Assessment/Plan: ?Patient Active Problem List  ? Diagnosis Date Noted  ? S/P hemorrhoidectomy 07/31/2021  ? Greater trochanteric bursitis of right hip 09/04/2020  ? Nonrheumatic mitral valve regurgitation 06/15/2020  ? Aortic atherosclerosis ([place]) 06/15/2020  ? Nonallopathic lesion of sacral region 06/08/2020  ? Low back pain 01/26/2017  ? Peripheral neuropathy 07/18/2016  ? Loss of transverse plantar arch 07/18/2016  ? Medication dosing interval  changed 09/12/2015  ? Increased abdominal girth 04/04/2015  ? Coronary artery disease due to lipid rich plaque 04/04/2015  ? Lung nodule 11/07/2014  ? Altered blood in stool 09/27/2014  ? ALC (alcoholic liver cirrhosis) ([place]) 07/26/2014  ? Endometriosis 07/26/2014  ? Bleeding esophageal varices ([place]) 07/26/2014  ? Acute blood loss anemia 03/23/2014  ? Below normal amount of sodium in the blood 03/23/2014  ? Drug-induced hypokalemia 02/17/2014  ? Alcohol abuse, in remission 02/08/2014  ? Biliary cirrhosis ([place]) 02/08/2014  ? Abdominal dropsy 02/02/2014  ? H/O cardiovascular disorder 02/02/2014  ? Breath shortness 02/02/2014  ? Cirrhosis ([place]) 11/30/2013  ? Clinical depression 07/23/2004  ? Chronic nonalcoholic liver disease 29/56/2130  ? Hypercholesterolemia without hypertriglyceridemia 07/23/2004  ? ?s/p Procedure(s): ?TRANSANAL EXCISION OF DISTAL RECTAL POLYPOID MASS 07/31/2021 ? ?-Ambulating, voiding, tolerating diet, no pain, no bleeding ?-Stable for discharge; reviewed postop expectations and plans moving forward ?-Path pending ? ? LOS: 0 days  ? ?Nadeen Landau, MD FACS ?[facility] ?

## 2021-08-02 NOTE — Discharge Summary (Signed)
Patient ID: ?Jenisse Vullo [place] ?MRN: [id number] ?DOB/AGE: 09-20-1945 76 y.o. ? ?Admit date: 07/31/2021 ?Discharge date: 08/01/2021 ? ?Discharge Diagnoses ?Patient Active Problem List  ? Diagnosis Date Noted  ? S/P hemorrhoidectomy 07/31/2021  ? Greater trochanteric bursitis of right hip 09/04/2020  ? Nonrheumatic mitral valve regurgitation 06/15/2020  ? Aortic atherosclerosis ([place]) 06/15/2020  ? Nonallopathic lesion of sacral region 06/08/2020  ? Low back pain 01/26/2017  ? Peripheral neuropathy 07/18/2016  ? Loss of transverse plantar arch 07/18/2016  ? Medication dosing interval changed 09/12/2015  ? Increased abdominal girth 04/04/2015  ? Coronary artery disease due to lipid rich plaque 04/04/2015  ? Lung nodule 11/07/2014  ? Altered blood in stool 09/27/2014  ? ALC (alcoholic liver cirrhosis) ([place]) 07/26/2014  ? Endometriosis 07/26/2014  ? Bleeding esophageal varices ([place]) 07/26/2014  ? Acute blood loss anemia 03/23/2014  ? Below normal amount of sodium in the blood 03/23/2014  ? Drug-induced hypokalemia 02/17/2014  ? Alcohol abuse, in remission 02/08/2014  ? Biliary cirrhosis ([place]) 02/08/2014  ? Abdominal dropsy 02/02/2014  ? H/O cardiovascular disorder 02/02/2014  ? Breath shortness 02/02/2014  ? Cirrhosis ([place]) 11/30/2013  ? Clinical depression 07/23/2004  ? Chronic nonalcoholic liver disease 94/49/6759  ? Hypercholesterolemia without hypertriglyceridemia 07/23/2004  ? ? ?Consultants ?None ? ? ?Hospital Course: She was admitted postoepratively after undergoing trans-anal excision of a distal rectal polypoid mass 4/26. She had no family or friend support to remain with her at home following surgery so we elected to keep her overnight for safety. She had uneventful recovery and was discharged the following morning. ? ? ? ?Allergies as of 08/01/2021   ? ?   Reactions  ? Carvedilol Other (See Comments)  ? Hypertension (intolerance), Rapid Heart Rate ?Increases BP to 300 - per pt  ? Doxepin Hcl   ? Other  reaction(s): does not do anything for her  ? Trazodone Hcl   ? Other reaction(s): in effective for sleep (2016)  ? ?  ? ?  ?Medication List  ?  ? ?TAKE these medications   ? ?aspirin 81 MG EC tablet ?Take 81 mg by mouth every other day. ?  ?b complex vitamins capsule ?Take 1 capsule by mouth daily. ?  ?Cholecalciferol 25 MCG (1000 UT) tablet ?Take 1,000 Units by mouth See admin instructions. Every other day ?  ?furosemide 20 MG tablet ?Commonly known as: LASIX ?Take 20 mg by mouth daily. ?  ?gabapentin 100 MG capsule ?Commonly known as: NEURONTIN ?Take 2 capsules (200 mg total) by mouth at bedtime. ?  ?lactulose 10 GM/15ML solution ?Commonly known as: CHRONULAC ?3-4 tablespoons daily by mouth ?  ?multivitamin with minerals tablet ?Take 1 tablet by mouth every other day. ?  ?nadolol 20 MG tablet ?Commonly known as: CORGARD ?Take 10 mg by mouth daily. ?  ?polyethylene glycol powder 17 GM/SCOOP powder ?Commonly known as: GLYCOLAX/MIRALAX ?Take 0.5 Containers by mouth daily as needed for mild constipation or moderate constipation. ?  ?Praluent 75 MG/ML Soaj ?Generic drug: Alirocumab ?INJECT '75MG'$  INTO THE SKIN EVERY 14 DAYS ?  ?Qunol Ultra CoQ10 100-150 MG-UNIT Caps ?Generic drug: Coenzyme Q10-Vitamin E ?Take 1 tablet by mouth in the morning. ?  ?rifaximin 550 MG Tabs tablet ?Commonly known as: XIFAXAN ?Take 550 mg by mouth 2 (two) times daily. ?  ?spironolactone 50 MG tablet ?Commonly known as: ALDACTONE ?Take 50 mg by mouth every morning. ?  ?traMADol 50 MG tablet ?Commonly known as: Ultram ?Take 1 tablet (50 mg total) by mouth every  6 (six) hours as needed for up to 5 days (postop pain not controlled with tylenol/ibuprofen first). ?  ? ?  ? ? ? ? Follow-up Information   ? ? Ileana Roup, MD Follow up in 3 week(s).   ?Specialties: General Surgery, Colon and Rectal Surgery ?Contact information: ?[address] ?[address] ?[place] [id number] ?[phone] ? ? ?  ?  ? ?  ?  ? ?  ? ? ?Sharon Mt.  Dema Severin, M.D. ?[facility] ? ?

## 2021-08-08 ENCOUNTER — Other Ambulatory Visit: Payer: Self-pay | Admitting: Family Medicine

## 2021-08-08 DIAGNOSIS — Z1231 Encounter for screening mammogram for malignant neoplasm of breast: Secondary | ICD-10-CM

## 2021-09-20 ENCOUNTER — Ambulatory Visit
Admission: RE | Admit: 2021-09-20 | Discharge: 2021-09-20 | Disposition: A | Discharge status: Home / Self Care | Payer: Medicare Other | Source: Ambulatory Visit | Attending: Family Medicine | Admitting: Family Medicine

## 2021-09-20 DIAGNOSIS — Z1231 Encounter for screening mammogram for malignant neoplasm of breast: Secondary | ICD-10-CM

## 2021-10-07 DIAGNOSIS — I8500 Esophageal varices without bleeding: Secondary | ICD-10-CM | POA: Diagnosis not present

## 2021-10-07 DIAGNOSIS — K635 Polyp of colon: Secondary | ICD-10-CM | POA: Diagnosis not present

## 2021-10-07 DIAGNOSIS — K7460 Unspecified cirrhosis of liver: Secondary | ICD-10-CM | POA: Diagnosis not present

## 2021-10-18 ENCOUNTER — Other Ambulatory Visit: Payer: Self-pay | Admitting: Gastroenterology

## 2021-10-18 DIAGNOSIS — K7689 Other specified diseases of liver: Secondary | ICD-10-CM

## 2021-10-30 ENCOUNTER — Ambulatory Visit
Admission: RE | Admit: 2021-10-30 | Discharge: 2021-10-30 | Disposition: A | Discharge status: Home / Self Care | Payer: Medicare Other | Source: Ambulatory Visit | Attending: Gastroenterology | Admitting: Gastroenterology

## 2021-10-30 DIAGNOSIS — K8020 Calculus of gallbladder without cholecystitis without obstruction: Secondary | ICD-10-CM | POA: Diagnosis not present

## 2021-10-30 DIAGNOSIS — K7460 Unspecified cirrhosis of liver: Secondary | ICD-10-CM | POA: Diagnosis not present

## 2021-10-30 DIAGNOSIS — K828 Other specified diseases of gallbladder: Secondary | ICD-10-CM | POA: Diagnosis not present

## 2021-10-30 DIAGNOSIS — K7689 Other specified diseases of liver: Secondary | ICD-10-CM | POA: Diagnosis not present

## 2021-10-30 MED ORDER — GADOBENATE DIMEGLUMINE 529 MG/ML IV SOLN
15.0000 mL | Freq: Once | INTRAVENOUS | Status: AC | PRN
Start: 2021-10-30 — End: 2021-10-30
  Administered 2021-10-30: 15 mL via INTRAVENOUS

## 2021-12-14 DIAGNOSIS — Z23 Encounter for immunization: Secondary | ICD-10-CM | POA: Diagnosis not present

## 2022-01-01 DIAGNOSIS — L538 Other specified erythematous conditions: Secondary | ICD-10-CM | POA: Diagnosis not present

## 2022-01-01 DIAGNOSIS — R208 Other disturbances of skin sensation: Secondary | ICD-10-CM | POA: Diagnosis not present

## 2022-01-01 DIAGNOSIS — L820 Inflamed seborrheic keratosis: Secondary | ICD-10-CM | POA: Diagnosis not present

## 2022-01-01 DIAGNOSIS — Z789 Other specified health status: Secondary | ICD-10-CM | POA: Diagnosis not present

## 2022-01-01 DIAGNOSIS — B079 Viral wart, unspecified: Secondary | ICD-10-CM | POA: Diagnosis not present

## 2022-01-01 DIAGNOSIS — B078 Other viral warts: Secondary | ICD-10-CM | POA: Diagnosis not present

## 2022-01-01 DIAGNOSIS — L298 Other pruritus: Secondary | ICD-10-CM | POA: Diagnosis not present

## 2022-01-11 DIAGNOSIS — Z23 Encounter for immunization: Secondary | ICD-10-CM | POA: Diagnosis not present

## 2022-01-21 DIAGNOSIS — J069 Acute upper respiratory infection, unspecified: Secondary | ICD-10-CM | POA: Diagnosis not present

## 2022-01-21 DIAGNOSIS — Z20822 Contact with and (suspected) exposure to covid-19: Secondary | ICD-10-CM | POA: Diagnosis not present

## 2022-01-25 DIAGNOSIS — J209 Acute bronchitis, unspecified: Secondary | ICD-10-CM | POA: Diagnosis not present

## 2022-01-25 DIAGNOSIS — R062 Wheezing: Secondary | ICD-10-CM | POA: Diagnosis not present

## 2022-01-25 DIAGNOSIS — R059 Cough, unspecified: Secondary | ICD-10-CM | POA: Diagnosis not present

## 2022-02-11 ENCOUNTER — Encounter: Payer: Self-pay | Admitting: Pulmonary Disease

## 2022-02-11 ENCOUNTER — Ambulatory Visit (INDEPENDENT_AMBULATORY_CARE_PROVIDER_SITE_OTHER): Payer: Medicare Other

## 2022-02-11 ENCOUNTER — Ambulatory Visit (INDEPENDENT_AMBULATORY_CARE_PROVIDER_SITE_OTHER): Payer: Medicare Other | Admitting: Pulmonary Disease

## 2022-02-11 VITALS — BP 118/66 | HR 66 | Ht 66.0 in | Wt 160.0 lb

## 2022-02-11 DIAGNOSIS — R0602 Shortness of breath: Secondary | ICD-10-CM

## 2022-02-11 NOTE — Progress Notes (Signed)
Synopsis: Referred in November 2023 for bronchitis, self-referred  Subjective:   PATIENT ID: Nicole Barr GENDER: female DOB: Aug 10, 1945, MRN: [id number]  HPI  Chief Complaint  Patient presents with   Consult    Self referral for frequent bronchitis. Was a social smoker.    Nicole Barr is a 76 year old woman, daily smoker with alcoholic cirrhosis, coronary artery disease and GERD who is self-referred to pulmonary clinic for evaluation.   She reports being treated for bronchitis about a month ago with Augmentin and prednisone taper which has improved her symptoms.   She has history of bronchitis episodes at least once per year. She reports seasonal allergies in the fall. She has gained about 30lbs and is hoping to increase her physical activity with running to help lose weight. She denies heart burn or reflux.   She is followed by GI for her cirrhosis and is on spironolactone and lasix.   She denies night time respiratory symptoms. She has trouble sleeping due to mental activity.    She is smoking 5 cigarettes per day. She has been smoking for 15 years. Retired from [employer]. 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 No pets at home.    Past Medical History:  Diagnosis Date   Alcoholic cirrhosis ([place])    Ascites    Coronary artery disease    Endometriosis    GERD (gastroesophageal reflux disease)    Hypertension    Liver cyst      Family History  Problem Relation Age of Onset   Diabetes Mother    Cancer Mother    Hypertension Mother    Hyperlipidemia Mother    Lupus Mother    Vitiligo Mother    Heart failure Father    Heart failure Maternal Grandmother      Social History   Socioeconomic History   Marital status: Single    Spouse name: Not on file   Number of children: Not on file   Years of education: Not on file   Highest education level: Not on file   Occupational History   Not on file  Tobacco Use   Smoking status: Some Days    Packs/day: 0.25    Years: 35.00    Total pack years: 8.75    Types: Cigarettes    Start date: 04/17/1968   Smokeless tobacco: Never  Vaping Use   Vaping Use: Never used  Substance and Sexual Activity   Alcohol use: No    Alcohol/week: 10.0 standard drinks of alcohol    Types: 10 Glasses of wine per week    Comment: currently states she is not drinking but states she usually had 2 glasses of wine per evening   Drug use: No   Sexual activity: Not on file  Other Topics Concern   Not on file  Social History Narrative   Not on file   Social Determinants of Health   Financial Resource Strain: Not on file  Food Insecurity: Not on file  Transportation Needs: Not on file  Physical Activity: Not on file  Stress: Not on file  Social Connections: Not on file  Intimate Partner Violence: Not on file     Allergies  Allergen Reactions   Carvedilol Other (See Comments)    Hypertension (intolerance), Rapid Heart Rate Increases BP to 300 - per pt    Doxepin Hcl     Other reaction(s):  does not do anything for her   Trazodone Hcl     Other reaction(s): in effective for sleep (2016)     Outpatient Medications Prior to Visit  Medication Sig Dispense Refill   albuterol (VENTOLIN HFA) 108 (90 Base) MCG/ACT inhaler Inhale 2 puffs into the lungs every 4 (four) hours as needed for wheezing or shortness of breath.     Alirocumab (PRALUENT) 75 MG/ML SOAJ INJECT '75MG'$  INTO THE SKIN EVERY 14 DAYS 6 mL 3   aspirin 81 MG EC tablet Take 81 mg by mouth every other day.     b complex vitamins capsule Take 1 capsule by mouth daily.     Cholecalciferol 25 MCG (1000 UT) tablet Take 1,000 Units by mouth See admin instructions. Every other day     Coenzyme Q10-Vitamin E (QUNOL ULTRA COQ10) 100-150 MG-UNIT CAPS Take 1 tablet by mouth in the morning.     furosemide (LASIX) 20 MG tablet Take 20 mg by mouth daily.     gabapentin  (NEURONTIN) 100 MG capsule Take 2 capsules (200 mg total) by mouth at bedtime. 180 capsule 3   lactulose (CHRONULAC) 10 GM/15ML solution 3-4 tablespoons daily by mouth     Multiple Vitamins-Minerals (MULTIVITAMIN WITH MINERALS) tablet Take 1 tablet by mouth every other day.     nadolol (CORGARD) 20 MG tablet Take 10 mg by mouth daily.     polyethylene glycol powder (GLYCOLAX/MIRALAX) 17 GM/SCOOP powder Take 0.5 Containers by mouth daily as needed for mild constipation or moderate constipation.     rifaximin (XIFAXAN) 550 MG TABS tablet Take 550 mg by mouth 2 (two) times daily.     spironolactone (ALDACTONE) 50 MG tablet Take 50 mg by mouth every morning.     No facility-administered medications prior to visit.    Review of Systems  Constitutional:  Negative for chills, fever, malaise/fatigue and weight loss.  HENT:  Negative for congestion, sinus pain and sore throat.   Eyes: Negative.   Respiratory:  Positive for shortness of breath. Negative for cough, hemoptysis, sputum production and wheezing.   Cardiovascular:  Negative for chest pain, palpitations, orthopnea, claudication and leg swelling.  Gastrointestinal:  Negative for abdominal pain, heartburn, nausea and vomiting.  Genitourinary: Negative.   Musculoskeletal:  Negative for joint pain and myalgias.  Skin:  Negative for rash.  Neurological:  Negative for weakness.  Endo/Heme/Allergies: Negative.   Psychiatric/Behavioral: Negative.     Objective:   Vitals:   02/11/22 0848  BP: 118/66  Pulse: 66  SpO2: 99%  Weight: 160 lb (72.6 kg)  Height: '5\' 6"'$  (1.676 m)   Physical Exam Constitutional:      General: She is not in acute distress.    Appearance: She is not ill-appearing.  HENT:     Head: Normocephalic and atraumatic.  Eyes:     General: No scleral icterus.    Conjunctiva/sclera: Conjunctivae normal.     Pupils: Pupils are equal, round, and reactive to light.  Cardiovascular:     Rate and Rhythm: Normal rate and  regular rhythm.     Pulses: Normal pulses.     Heart sounds: Normal heart sounds. No murmur heard. Pulmonary:     Effort: Pulmonary effort is normal.     Breath sounds: Normal breath sounds. No wheezing, rhonchi or rales.  Abdominal:     General: Bowel sounds are normal.     Palpations: Abdomen is soft.  Musculoskeletal:     Right lower leg: No edema.  Left lower leg: No edema.  Lymphadenopathy:     Cervical: No cervical adenopathy.  Skin:    General: Skin is warm and dry.  Neurological:     General: No focal deficit present.     Mental Status: She is alert.  Psychiatric:        Mood and Affect: Mood normal.        Behavior: Behavior normal.        Thought Content: Thought content normal.        Judgment: Judgment normal.    CBC    Component Value Date/Time   WBC 4.6 07/23/2021 0845   RBC 4.38 07/23/2021 0845   HGB 13.2 07/23/2021 0845   HGB 13.0 05/13/2019 0923   HCT 38.7 07/23/2021 0845   HCT 37.8 05/13/2019 0923   PLT 218 07/23/2021 0845   PLT 227 05/13/2019 0923   MCV 88.4 07/23/2021 0845   MCV 90 05/13/2019 0923   MCH 30.1 07/23/2021 0845   MCHC 34.1 07/23/2021 0845   RDW 13.4 07/23/2021 0845   RDW 12.1 05/13/2019 0923   LYMPHSABS 1.4 07/23/2021 0845   LYMPHSABS 1.4 04/21/2018 0734   MONOABS 0.5 07/23/2021 0845   EOSABS 0.2 07/23/2021 0845   EOSABS 0.2 04/21/2018 0734   BASOSABS 0.0 07/23/2021 0845   BASOSABS 0.0 04/21/2018 0734      Latest Ref Rng & Units 07/31/2021   11:55 AM 07/24/2020    8:43 AM 06/08/2020   10:54 AM  BMP  Glucose 70 - 99 mg/dL 103  107  91   BUN 8 - 23 mg/dL '12  17  23   '$ Creatinine 0.44 - 1.00 mg/dL 0.96  1.10  1.13   Sodium 135 - 145 mmol/L 139  139  140   Potassium 3.5 - 5.1 mmol/L 3.6  3.6  3.7   Chloride 98 - 111 mmol/L 105  101  100   CO2 22 - 32 mmol/L 25  30  32   Calcium 8.9 - 10.3 mg/dL 9.7  10.4  10.3     Chest imaging: CT Chest 11/2016 Cardiovascular: Normal heart size. No pericardial effusion. Atherosclerosis  of the aorta and coronaries. Status post coronary calcium scoring in 2016. No acute finding in the great vessels.   Mediastinum/Nodes: Calcified mediastinal lymph nodes. No enlarged lymph nodes.   Lungs/Pleura: The 6 mm right lower lobe pulmonary nodule seen on 3:111 is size stable compared to 2016. Size is mildly overestimated due to closely neighboring vessel. No new pulmonary nodule is seen. There is no edema, consolidation, effusion, or pneumothorax. PFT:     No data to display          Labs:  Path:  Echo 2022: LV EF 70-75%. Hyperdynamic function. RV systolic function and size is normal. LA and RA are mildly dilated.   Heart Catheterization:  Assessment & Plan:   Shortness of breath - Plan: Pulmonary Function Test  Discussion: Nicole Barr is a 76 year old woman, daily smoker with alcoholic cirrhosis, coronary artery disease and GERD who is self-referred to pulmonary clinic for evaluation.   She has history of recurrent episodes of bronchitis. She is to use albuterol inhaler 1-2 puffs every 4-6 hours for cough, wheezing or dyspnea.  We will check a chest x-ray today and PFTs at follow up.  Follow up in 3 months.   Freda Jackson, MD [facility] Office: [phone]   Current Outpatient Medications:    albuterol (VENTOLIN HFA) 108 (90  Base) MCG/ACT inhaler, Inhale 2 puffs into the lungs every 4 (four) hours as needed for wheezing or shortness of breath., Disp: , Rfl:    Alirocumab (PRALUENT) 75 MG/ML SOAJ, INJECT '75MG'$  INTO THE SKIN EVERY 14 DAYS, Disp: 6 mL, Rfl: 3   aspirin 81 MG EC tablet, Take 81 mg by mouth every other day., Disp: , Rfl:    b complex vitamins capsule, Take 1 capsule by mouth daily., Disp: , Rfl:    Cholecalciferol 25 MCG (1000 UT) tablet, Take 1,000 Units by mouth See admin instructions. Every other day, Disp: , Rfl:    Coenzyme Q10-Vitamin E (QUNOL ULTRA COQ10) 100-150 MG-UNIT CAPS, Take 1 tablet by mouth in the  morning., Disp: , Rfl:    furosemide (LASIX) 20 MG tablet, Take 20 mg by mouth daily., Disp: , Rfl:    gabapentin (NEURONTIN) 100 MG capsule, Take 2 capsules (200 mg total) by mouth at bedtime., Disp: 180 capsule, Rfl: 3   lactulose (CHRONULAC) 10 GM/15ML solution, 3-4 tablespoons daily by mouth, Disp: , Rfl:    Multiple Vitamins-Minerals (MULTIVITAMIN WITH MINERALS) tablet, Take 1 tablet by mouth every other day., Disp: , Rfl:    nadolol (CORGARD) 20 MG tablet, Take 10 mg by mouth daily., Disp: , Rfl:    polyethylene glycol powder (GLYCOLAX/MIRALAX) 17 GM/SCOOP powder, Take 0.5 Containers by mouth daily as needed for mild constipation or moderate constipation., Disp: , Rfl:    rifaximin (XIFAXAN) 550 MG TABS tablet, Take 550 mg by mouth 2 (two) times daily., Disp: , Rfl:    spironolactone (ALDACTONE) 50 MG tablet, Take 50 mg by mouth every morning., Disp: , Rfl:

## 2022-02-11 NOTE — Patient Instructions (Addendum)
We will check a chest x-ray today  Recommend albuterol inhaler 1-2 puffs every 4-6 hours as needed for shortness of breath, cough or wheezing.  Follow up in 3 months with pulmonary function tests

## 2022-02-18 DIAGNOSIS — Z720 Tobacco use: Secondary | ICD-10-CM | POA: Diagnosis not present

## 2022-02-18 DIAGNOSIS — I700 Atherosclerosis of aorta: Secondary | ICD-10-CM | POA: Diagnosis not present

## 2022-02-18 DIAGNOSIS — N183 Chronic kidney disease, stage 3 unspecified: Secondary | ICD-10-CM | POA: Diagnosis not present

## 2022-02-18 DIAGNOSIS — Z0000 Encounter for general adult medical examination without abnormal findings: Secondary | ICD-10-CM | POA: Diagnosis not present

## 2022-02-18 DIAGNOSIS — K7030 Alcoholic cirrhosis of liver without ascites: Secondary | ICD-10-CM | POA: Diagnosis not present

## 2022-02-18 DIAGNOSIS — Z1331 Encounter for screening for depression: Secondary | ICD-10-CM | POA: Diagnosis not present

## 2022-02-18 DIAGNOSIS — E7800 Pure hypercholesterolemia, unspecified: Secondary | ICD-10-CM | POA: Diagnosis not present

## 2022-03-11 ENCOUNTER — Ambulatory Visit: Payer: Medicare Other | Admitting: Dietician

## 2022-03-19 DIAGNOSIS — N183 Chronic kidney disease, stage 3 unspecified: Secondary | ICD-10-CM | POA: Diagnosis not present

## 2022-03-19 DIAGNOSIS — K7030 Alcoholic cirrhosis of liver without ascites: Secondary | ICD-10-CM | POA: Diagnosis not present

## 2022-03-19 DIAGNOSIS — N1831 Chronic kidney disease, stage 3a: Secondary | ICD-10-CM | POA: Diagnosis not present

## 2022-04-15 ENCOUNTER — Encounter (HOSPITAL_COMMUNITY): Payer: Medicare Other

## 2022-04-18 ENCOUNTER — Ambulatory Visit (HOSPITAL_COMMUNITY)
Admission: RE | Admit: 2022-04-18 | Discharge: 2022-04-18 | Disposition: A | Discharge status: Home / Self Care | Payer: Medicare Other | Source: Ambulatory Visit | Attending: Cardiology | Admitting: Cardiology

## 2022-04-18 DIAGNOSIS — I6523 Occlusion and stenosis of bilateral carotid arteries: Secondary | ICD-10-CM | POA: Diagnosis not present

## 2022-04-22 ENCOUNTER — Telehealth: Payer: Self-pay

## 2022-04-22 DIAGNOSIS — I6523 Occlusion and stenosis of bilateral carotid arteries: Secondary | ICD-10-CM

## 2022-04-22 NOTE — Telephone Encounter (Signed)
Order placed for bilateral carotid US due in 1 yr.

## 2022-04-22 NOTE — Telephone Encounter (Signed)
-----  Message from Werner Lean, MD sent at 04/20/2022 10:15 AM EST ----- Results: Stable carotid disease Plan: Repeat study one year  Werner Lean, MD

## 2022-04-23 ENCOUNTER — Other Ambulatory Visit: Payer: Self-pay | Admitting: Gastroenterology

## 2022-04-23 DIAGNOSIS — K7460 Unspecified cirrhosis of liver: Secondary | ICD-10-CM

## 2022-04-29 DIAGNOSIS — K259 Gastric ulcer, unspecified as acute or chronic, without hemorrhage or perforation: Secondary | ICD-10-CM | POA: Diagnosis not present

## 2022-04-29 DIAGNOSIS — K319 Disease of stomach and duodenum, unspecified: Secondary | ICD-10-CM | POA: Diagnosis not present

## 2022-04-29 DIAGNOSIS — Z86010 Personal history of colonic polyps: Secondary | ICD-10-CM | POA: Diagnosis not present

## 2022-04-29 DIAGNOSIS — K5730 Diverticulosis of large intestine without perforation or abscess without bleeding: Secondary | ICD-10-CM | POA: Diagnosis not present

## 2022-04-29 DIAGNOSIS — D123 Benign neoplasm of transverse colon: Secondary | ICD-10-CM | POA: Diagnosis not present

## 2022-04-29 DIAGNOSIS — D120 Benign neoplasm of cecum: Secondary | ICD-10-CM | POA: Diagnosis not present

## 2022-04-29 DIAGNOSIS — K7460 Unspecified cirrhosis of liver: Secondary | ICD-10-CM | POA: Diagnosis not present

## 2022-04-29 DIAGNOSIS — K5710 Diverticulosis of small intestine without perforation or abscess without bleeding: Secondary | ICD-10-CM | POA: Diagnosis not present

## 2022-04-29 DIAGNOSIS — Z09 Encounter for follow-up examination after completed treatment for conditions other than malignant neoplasm: Secondary | ICD-10-CM | POA: Diagnosis not present

## 2022-05-01 DIAGNOSIS — K319 Disease of stomach and duodenum, unspecified: Secondary | ICD-10-CM | POA: Diagnosis not present

## 2022-05-01 DIAGNOSIS — D120 Benign neoplasm of cecum: Secondary | ICD-10-CM | POA: Diagnosis not present

## 2022-05-01 DIAGNOSIS — D123 Benign neoplasm of transverse colon: Secondary | ICD-10-CM | POA: Diagnosis not present

## 2022-05-14 ENCOUNTER — Ambulatory Visit
Admission: RE | Admit: 2022-05-14 | Discharge: 2022-05-14 | Disposition: A | Discharge status: Home / Self Care | Payer: Medicare Other | Source: Ambulatory Visit | Attending: Gastroenterology | Admitting: Gastroenterology

## 2022-05-14 DIAGNOSIS — K7460 Unspecified cirrhosis of liver: Secondary | ICD-10-CM | POA: Diagnosis not present

## 2022-05-14 DIAGNOSIS — K8020 Calculus of gallbladder without cholecystitis without obstruction: Secondary | ICD-10-CM | POA: Diagnosis not present

## 2022-05-30 ENCOUNTER — Other Ambulatory Visit: Payer: Self-pay | Admitting: Internal Medicine

## 2022-05-30 DIAGNOSIS — I2510 Atherosclerotic heart disease of native coronary artery without angina pectoris: Secondary | ICD-10-CM

## 2022-05-30 DIAGNOSIS — E7800 Pure hypercholesterolemia, unspecified: Secondary | ICD-10-CM

## 2022-06-25 ENCOUNTER — Ambulatory Visit: Payer: Medicare Other | Admitting: Pulmonary Disease

## 2022-06-25 ENCOUNTER — Encounter: Payer: Medicare Other | Admitting: Pulmonary Disease

## 2022-07-02 ENCOUNTER — Ambulatory Visit (INDEPENDENT_AMBULATORY_CARE_PROVIDER_SITE_OTHER): Payer: Medicare Other | Admitting: Nurse Practitioner

## 2022-07-02 ENCOUNTER — Encounter: Payer: Self-pay | Admitting: Nurse Practitioner

## 2022-07-02 ENCOUNTER — Other Ambulatory Visit (INDEPENDENT_AMBULATORY_CARE_PROVIDER_SITE_OTHER): Payer: Medicare Other

## 2022-07-02 ENCOUNTER — Ambulatory Visit (INDEPENDENT_AMBULATORY_CARE_PROVIDER_SITE_OTHER): Payer: Medicare Other

## 2022-07-02 ENCOUNTER — Ambulatory Visit (INDEPENDENT_AMBULATORY_CARE_PROVIDER_SITE_OTHER): Payer: Medicare Other | Admitting: Pulmonary Disease

## 2022-07-02 VITALS — BP 98/58 | HR 84 | Ht 66.5 in | Wt 166.0 lb

## 2022-07-02 DIAGNOSIS — R0602 Shortness of breath: Secondary | ICD-10-CM

## 2022-07-02 DIAGNOSIS — J189 Pneumonia, unspecified organism: Secondary | ICD-10-CM | POA: Diagnosis not present

## 2022-07-02 DIAGNOSIS — R9389 Abnormal findings on diagnostic imaging of other specified body structures: Secondary | ICD-10-CM

## 2022-07-02 DIAGNOSIS — R002 Palpitations: Secondary | ICD-10-CM

## 2022-07-02 DIAGNOSIS — J45909 Unspecified asthma, uncomplicated: Secondary | ICD-10-CM | POA: Insufficient documentation

## 2022-07-02 DIAGNOSIS — R0609 Other forms of dyspnea: Secondary | ICD-10-CM

## 2022-07-02 DIAGNOSIS — Z720 Tobacco use: Secondary | ICD-10-CM | POA: Diagnosis not present

## 2022-07-02 DIAGNOSIS — J4530 Mild persistent asthma, uncomplicated: Secondary | ICD-10-CM | POA: Diagnosis not present

## 2022-07-02 LAB — PULMONARY FUNCTION TEST
DL/VA % pred: 77 %
DL/VA: 3.12 ml/min/mmHg/L
DLCO cor % pred: 80 %
DLCO cor: 16.72 ml/min/mmHg
DLCO unc % pred: 80 %
DLCO unc: 16.72 ml/min/mmHg
FEF 25-75 Post: 1.91 L/sec
FEF 25-75 Pre: 2.3 L/sec
FEF2575-%Change-Post: -17 %
FEF2575-%Pred-Post: 105 %
FEF2575-%Pred-Pre: 127 %
FEV1-%Change-Post: -4 %
FEV1-%Pred-Post: 100 %
FEV1-%Pred-Pre: 104 %
FEV1-Post: 2.36 L
FEV1-Pre: 2.47 L
FEV1FVC-%Change-Post: -1 %
FEV1FVC-%Pred-Pre: 104 %
FEV6-%Change-Post: -2 %
FEV6-%Pred-Post: 103 %
FEV6-%Pred-Pre: 105 %
FEV6-Post: 3.07 L
FEV6-Pre: 3.15 L
FEV6FVC-%Pred-Post: 104 %
FEV6FVC-%Pred-Pre: 104 %
FVC-%Change-Post: -2 %
FVC-%Pred-Post: 98 %
FVC-%Pred-Pre: 101 %
FVC-Post: 3.07 L
FVC-Pre: 3.16 L
Post FEV1/FVC ratio: 77 %
Post FEV6/FVC ratio: 100 %
Pre FEV1/FVC ratio: 78 %
Pre FEV6/FVC Ratio: 100 %
RV % pred: 131 %
RV: 3.17 L
TLC % pred: 117 %
TLC: 6.38 L

## 2022-07-02 MED ORDER — FLUTICASONE-SALMETEROL 45-21 MCG/ACT IN AERO: 2.0000 | INHALATION_SPRAY | Freq: Two times a day (BID) | RESPIRATORY_TRACT | 12 refills | Status: DC

## 2022-07-02 NOTE — Assessment & Plan Note (Signed)
Active some day smoker. She has never been a heavy smoker. Pack year history is <20 years so does not qualify for lung cancer screening. Given her symptoms and smoking history, we will order CT chest to assess for any pulmonary abnormalities.

## 2022-07-02 NOTE — Progress Notes (Signed)
Persistent but slightly improved RML infiltrate. I've ordered her CT to be completed this week. Someone will contact her to schedule this.

## 2022-07-02 NOTE — Progress Notes (Signed)
She needs to come in for labs to assess her kidney function before her scan as it will be with contrast

## 2022-07-02 NOTE — Progress Notes (Signed)
Full PFT performed today. °

## 2022-07-02 NOTE — Patient Instructions (Signed)
Full PFT performed today. °

## 2022-07-02 NOTE — Assessment & Plan Note (Signed)
See above. Previous cardiac workup unremarkable. She is experiencing persistent palpitations. I do not see where she has ever worn a cardiac monitor. Advised her to discuss her symptoms with cardiology when she sees them in April to rule out underlying arrhythmias.  I will also send them a message.

## 2022-07-02 NOTE — Assessment & Plan Note (Signed)
See above

## 2022-07-02 NOTE — Patient Instructions (Addendum)
Continue Albuterol inhaler 2 puffs every 6 hours as needed for shortness of breath or wheezing. Notify if symptoms persist despite rescue inhaler/neb use.   Trial Advair 2 puffs Twice daily. Brush tongue and rinse mouth afterwards  Saline nasal rinses 1-2 times a day for nasal congestion   Chest x ray today  We will obtain a CT of your chest given your smoking history and shortness of breath. Timing will depend on your x ray results. Someone will contact you to schedule this  Keep appointment with cardiology to discuss your elevated heart rate. They may need to consider a heart monitor for further evaluation  Follow up in 6 weeks to see how new inhaler is going with Nicole Barr. If symptoms do not improve or worsen, please contact office for sooner follow up or seek emergency care.

## 2022-07-02 NOTE — Assessment & Plan Note (Addendum)
She has DOE, occasional cough, wheezing with history of recurrent bronchitis and seasonal allergies. PFTs today show evidence of air trapping without any reversibility. No formal obstruction. She did receive benefit from SABA during testing. Question a possible reactive airway component? We will trial her on ICS/LABA therapy to see if she has any perceived benefit. Action plan in place  Patient Instructions  Continue Albuterol inhaler 2 puffs every 6 hours as needed for shortness of breath or wheezing. Notify if symptoms persist despite rescue inhaler/neb use.   Trial Advair 2 puffs Twice daily. Brush tongue and rinse mouth afterwards  Saline nasal rinses 1-2 times a day for nasal congestion   Chest x ray today  We will obtain a CT of your chest given your smoking history and shortness of breath. Timing will depend on your x ray results. Someone will contact you to schedule this  Keep appointment with cardiology to discuss your elevated heart rate. They may need to consider a heart monitor for further evaluation  Follow up in 6 weeks to see how new inhaler is going with Dr. Erin Fulling. If symptoms do not improve or worsen, please contact office for sooner follow up or seek emergency care.

## 2022-07-02 NOTE — Assessment & Plan Note (Signed)
RML infiltrate on imaging from November 2023. She never had follow up for this. CXR ordered today. May move up timing of CT chest if infiltrate persists.

## 2022-07-02 NOTE — Progress Notes (Unsigned)
@Patient  ID: Nicole Barr, female    DOB: Nov 06, 1945, 77 y.o.   MRN: [id number]  Chief Complaint  Patient presents with   Follow-up    Pt f/u after PFT she is here to discuss her results, she states that she is feeling increasingly anxious     Referring provider: Gaynelle Arabian, MD  HPI: 77 year old female, some day smoker followed for shortness of breath. She is a patient of Dr. August Albino and last seen in office 02/11/2022. Past medical history significant for CAD, mitral valve regurgitation, cirrhosis, hx of ETOH abuse.   TEST/EVENTS:  02/11/2022 CXR: RML infiltrate  07/05/2020 echo: EF 70-75%, LVH. RV size and function nl. Normal RVSP. LA and RA mildly dilated. Trivial MR.  07/02/2022 PFT: FVC 101, FEV1 104, ratio 77, TLC 117, DLCOcor 80. No BD  02/11/2022: OV with Dr. Erin Fulling for consult. She has a history of bronchitis at least once a year. Reports seasonal allergies in the fall. She has gained about 30 lb and is hoping to increase her physical activity with running to help lose weight. Followed by GI for alcoholic cirrhosis; on spironolactone and lasix. Denies night time respiratory symptoms. She has trouble sleeping due to mental activity. Smoking 5 cigarettes per day. She has been smoking for 15 years. No significant occupational exposures. Prescribed albuterol to trial. PFTs and CXR ordered for further eval.  07/02/2022: Today - follow up Patient presents today for follow up after pulmonary function testing which were overall normal aside from some evidence of air trapping. She tells me that she feels like the albuterol from the test really helped her and her chest felt more open. She had tried it at home but she doesn't think she was using the inhaler correctly. She continues to have trouble with shortness of breath with more strenuous activities. She notices it with uphill walking or climbing stairs, cleaning around her house, or if she is unloading groceries. She has an  occasional wheeze. No significant cough but does have some throat clearing, which she relates to postnasal drip from allergies. She gets bronchitis a few times a year. She denies any fevers, chills, hemoptysis, weight loss, anorexia. She does have some sensation of heart racing at time, usually with exertion. No chest pain, PND, orthopnea, lightheadedness or dizziness. She had an episode of syncope years ago but workup was unremarkable. Follows with cardiology. No history of childhood asthma. She is an active, some day smoker but her pack year history is less than 20 years so she does not qualify for the lung cancer screening program. She had an abnormal chest x ray last time she was here with RML infiltrate but never had follow up from this.   Allergies  Allergen Reactions   Carvedilol Other (See Comments)    Hypertension (intolerance), Rapid Heart Rate Increases BP to 300 - per pt    Doxepin Hcl     Other reaction(s): does not do anything for her   Trazodone Hcl     Other reaction(s): in effective for sleep (2016)    Immunization History  Administered Date(s) Administered   PFIZER(Purple Top)SARS-COV-2 Vaccination 12/06/2019   Pfizer Covid-19 Vaccine Bivalent Booster 61yrs & up 12/27/2020    Past Medical History:  Diagnosis Date   Alcoholic cirrhosis ([place])    Ascites    Coronary artery disease    Endometriosis    GERD (gastroesophageal reflux disease)    Hypertension    Liver cyst     Tobacco History:  Social History   Tobacco Use  Smoking Status Some Days   Packs/day: 0.50   Years: 35.00   Additional pack years: 0.00   Total pack years: 17.50   Types: Cigarettes   Start date: 04/17/1968  Smokeless Tobacco Never   Ready to quit: Not Answered Counseling given: Not Answered   Outpatient Medications Prior to Visit  Medication Sig Dispense Refill   albuterol (VENTOLIN HFA) 108 (90 Base) MCG/ACT inhaler Inhale 2 puffs into the lungs every 4 (four) hours as needed for  wheezing or shortness of breath.     Alirocumab (PRALUENT) 75 MG/ML SOAJ INJECT 75MG  INTO THE SKIN EVERY 14 DAYS 6 mL 0   aspirin 81 MG EC tablet Take 81 mg by mouth every other day. PRN     b complex vitamins capsule Take 1 capsule by mouth daily.     Cholecalciferol 25 MCG (1000 UT) tablet Take 1,000 Units by mouth See admin instructions. Every other day     furosemide (LASIX) 20 MG tablet Take 20 mg by mouth daily.     nadolol (CORGARD) 20 MG tablet Take 10 mg by mouth daily.     rifaximin (XIFAXAN) 550 MG TABS tablet Take 550 mg by mouth 2 (two) times daily.     spironolactone (ALDACTONE) 50 MG tablet Take 50 mg by mouth every morning.     Coenzyme Q10-Vitamin E (QUNOL ULTRA COQ10) 100-150 MG-UNIT CAPS Take 1 tablet by mouth in the morning. (Patient not taking: Reported on 07/02/2022)     gabapentin (NEURONTIN) 100 MG capsule Take 2 capsules (200 mg total) by mouth at bedtime. (Patient not taking: Reported on 07/02/2022) 180 capsule 3   lactulose (CHRONULAC) 10 GM/15ML solution 3-4 tablespoons daily by mouth     Multiple Vitamins-Minerals (MULTIVITAMIN WITH MINERALS) tablet Take 1 tablet by mouth every other day. (Patient not taking: Reported on 07/02/2022)     polyethylene glycol powder (GLYCOLAX/MIRALAX) 17 GM/SCOOP powder Take 0.5 Containers by mouth daily as needed for mild constipation or moderate constipation.     No facility-administered medications prior to visit.     Review of Systems:   Constitutional: No weight loss or gain, night sweats, fevers, chills, fatigue, or lassitude. HEENT: No headaches, difficulty swallowing, tooth/dental problems, or sore throat. No sneezing, itching, ear ache, nasal congestion,. + post nasal drip, throat clearing CV:  No chest pain, orthopnea, PND, swelling in lower extremities, anasarca, dizziness, palpitations, syncope Resp: +shortness of breath with exertion; occasional chest tightness; intermittent wheeze. No excess mucus or change in color of  mucus. No productive or non-productive. No hemoptysis.  No chest wall deformity GI:  No heartburn, indigestion, abdominal pain, nausea, vomiting, diarrhea, change in bowel habits, loss of appetite, bloody stools.  GU: No dysuria, change in color of urine, urgency or frequency.   Skin: No rash, lesions, ulcerations MSK:  No joint pain or swelling.   Neuro: No dizziness or lightheadedness.  Psych: No depression. Mood stable. +anxiety, sleep disturbance    Physical Exam:  BP (!) 98/58   Pulse 84   Ht 5' 6.5" (1.689 m)   Wt 166 lb (75.3 kg)   SpO2 94%   BMI 26.39 kg/m   GEN: Pleasant, interactive, well-appearing; in no acute distress. HEENT:  Normocephalic and atraumatic. PERRLA. Sclera white. Nasal turbinates pink, moist and patent bilaterally. No rhinorrhea present. Oropharynx pink and moist, without exudate or edema. No lesions, ulcerations, or postnasal drip.  NECK:  Supple w/ fair ROM. No JVD present. Normal  carotid impulses w/o bruits. Thyroid symmetrical with no goiter or nodules palpated. No lymphadenopathy.   CV: RRR, no m/r/g, no peripheral edema. Pulses intact, +2 bilaterally. No cyanosis, pallor or clubbing. PULMONARY:  Unlabored, regular breathing. Clear bilaterally A&P w/o wheezes/rales/rhonchi. No accessory muscle use.  GI: BS present and normoactive. Soft, non-tender to palpation. No organomegaly or masses detected.  MSK: No erythema, warmth or tenderness. Cap refil <2 sec all extrem. No deformities or joint swelling noted.  Neuro: A/Ox3. No focal deficits noted.   Skin: Warm, no lesions or rashe Psych: Normal affect and behavior. Judgement and thought content appropriate.     Lab Results:  CBC    Component Value Date/Time   WBC 4.6 07/23/2021 0845   RBC 4.38 07/23/2021 0845   HGB 13.2 07/23/2021 0845   HGB 13.0 05/13/2019 0923   HCT 38.7 07/23/2021 0845   HCT 37.8 05/13/2019 0923   PLT 218 07/23/2021 0845   PLT 227 05/13/2019 0923   MCV 88.4 07/23/2021 0845    MCV 90 05/13/2019 0923   MCH 30.1 07/23/2021 0845   MCHC 34.1 07/23/2021 0845   RDW 13.4 07/23/2021 0845   RDW 12.1 05/13/2019 0923   LYMPHSABS 1.4 07/23/2021 0845   LYMPHSABS 1.4 04/21/2018 0734   MONOABS 0.5 07/23/2021 0845   EOSABS 0.2 07/23/2021 0845   EOSABS 0.2 04/21/2018 0734   BASOSABS 0.0 07/23/2021 0845   BASOSABS 0.0 04/21/2018 0734    BMET    Component Value Date/Time   NA 139 07/02/2022 1518   NA 141 05/13/2019 0923   K 3.9 07/02/2022 1518   CL 105 07/02/2022 1518   CO2 25 07/02/2022 1518   GLUCOSE 105 (H) 07/02/2022 1518   BUN 17 07/02/2022 1518   BUN 22 05/13/2019 0923   CREATININE 1.08 07/02/2022 1518   CREATININE 0.99 10/29/2015 0820   CALCIUM 9.7 07/02/2022 1518   GFRNONAA >60 07/31/2021 1155   GFRAA 43 (L) 05/13/2019 0923    BNP No results found for: "BNP"   Imaging:  DG Chest 2 View  Result Date: 07/02/2022 CLINICAL DATA:  pna f/u EXAM: CHEST - 2 VIEW COMPARISON:  02/11/2022 FINDINGS: Partial interval resolution of the right middle lobe airspace disease. Left lung remains clear. Heart size and mediastinal contours are within normal limits. Aortic Atherosclerosis (ICD10-170.0). No effusion. Visualized bones unremarkable. IMPRESSION: Partial resolution of right middle lobe airspace disease. Electronically Signed   By: Lucrezia Europe M.D.   On: 07/02/2022 11:05         Latest Ref Rng & Units 07/02/2022    8:55 AM  PFT Results  FVC-Pre L 3.16  P  FVC-Predicted Pre % 101  P  FVC-Post L 3.07  P  FVC-Predicted Post % 98  P  Pre FEV1/FVC % % 78  P  Post FEV1/FCV % % 77  P  FEV1-Pre L 2.47  P  FEV1-Predicted Pre % 104  P  FEV1-Post L 2.36  P  DLCO uncorrected ml/min/mmHg 16.72  P  DLCO UNC% % 80  P  DLCO corrected ml/min/mmHg 16.72  P  DLCO COR %Predicted % 80  P  DLVA Predicted % 77  P  TLC L 6.38  P  TLC % Predicted % 117  P  RV % Predicted % 131  P    P Preliminary result    No results found for: "NITRICOXIDE"      Assessment &  Plan:   Reactive airway disease She has DOE, occasional cough, wheezing  with history of recurrent bronchitis and seasonal allergies. PFTs today show evidence of air trapping without any reversibility. No formal obstruction. She did receive benefit from SABA during testing. Question a possible reactive airway component? We will trial her on ICS/LABA therapy to see if she has any perceived benefit. Action plan in place  Patient Instructions  Continue Albuterol inhaler 2 puffs every 6 hours as needed for shortness of breath or wheezing. Notify if symptoms persist despite rescue inhaler/neb use.   Trial Advair 2 puffs Twice daily. Brush tongue and rinse mouth afterwards  Saline nasal rinses 1-2 times a day for nasal congestion   Chest x ray today  We will obtain a CT of your chest given your smoking history and shortness of breath. Timing will depend on your x ray results. Someone will contact you to schedule this  Keep appointment with cardiology to discuss your elevated heart rate. They may need to consider a heart monitor for further evaluation  Follow up in 6 weeks to see how new inhaler is going with Dr. Erin Fulling. If symptoms do not improve or worsen, please contact office for sooner follow up or seek emergency care.    DOE (dyspnea on exertion) See above. Previous cardiac workup unremarkable. She is experiencing persistent palpitations. I do not see where she has ever worn a cardiac monitor. Advised her to discuss her symptoms with cardiology when she sees them in April to rule out underlying arrhythmias.  I will also send them a message.  Palpitations See above  Tobacco use Active some day smoker. She has never been a heavy smoker. Pack year history is <20 years so does not qualify for lung cancer screening. Given her symptoms and smoking history, we will order CT chest to assess for any pulmonary abnormalities.   Abnormal chest x-ray RML infiltrate on imaging from November 2023. She  never had follow up for this. CXR ordered today. May move up timing of CT chest if infiltrate persists.    I spent 42 minutes of dedicated to the care of this patient on the date of this encounter to include pre-visit review of records, face-to-face time with the patient discussing conditions above, post visit ordering of testing, clinical documentation with the electronic health record, making appropriate referrals as documented, and communicating necessary findings to members of the patients care team.  Clayton Bibles, NP 07/03/2022  Pt aware and understands NP's role.

## 2022-07-03 ENCOUNTER — Encounter: Payer: Self-pay | Admitting: Nurse Practitioner

## 2022-07-03 LAB — BASIC METABOLIC PANEL
BUN: 17 mg/dL (ref 6–23)
CO2: 25 mEq/L (ref 19–32)
Calcium: 9.7 mg/dL (ref 8.4–10.5)
Chloride: 105 mEq/L (ref 96–112)
Creatinine, Ser: 1.08 mg/dL (ref 0.40–1.20)
GFR: 50.29 mL/min — ABNORMAL LOW (ref >60.00)
Glucose, Bld: 105 mg/dL — ABNORMAL HIGH (ref 70–99)
Potassium: 3.9 mEq/L (ref 3.5–5.1)
Sodium: 139 mEq/L (ref 135–145)

## 2022-07-04 ENCOUNTER — Telehealth: Payer: Self-pay | Admitting: Pulmonary Disease

## 2022-07-04 NOTE — Telephone Encounter (Signed)
PT presented to the front stating that her Advair has a beta blocker in it and the Nadolaw is a beta blocker and they cancel each other out and the meds won't work. Please call to advise. OK to leave msg she said.  Her # is [phone]

## 2022-07-04 NOTE — Telephone Encounter (Signed)
Is the Advair ok to still continue to take even though he is taking nadolol?  They are both beta blockers?  Please advise sir

## 2022-07-04 NOTE — Progress Notes (Signed)
I have added Dr.Hammer to the pts chart as PCP

## 2022-07-07 ENCOUNTER — Ambulatory Visit (HOSPITAL_COMMUNITY)
Admission: RE | Admit: 2022-07-07 | Discharge: 2022-07-07 | Disposition: A | Discharge status: Home / Self Care | Payer: Medicare Other | Source: Ambulatory Visit | Attending: Nurse Practitioner | Admitting: Nurse Practitioner

## 2022-07-07 DIAGNOSIS — R0609 Other forms of dyspnea: Secondary | ICD-10-CM | POA: Insufficient documentation

## 2022-07-07 DIAGNOSIS — R911 Solitary pulmonary nodule: Secondary | ICD-10-CM | POA: Diagnosis not present

## 2022-07-07 DIAGNOSIS — R9389 Abnormal findings on diagnostic imaging of other specified body structures: Secondary | ICD-10-CM | POA: Diagnosis not present

## 2022-07-07 MED ORDER — IOHEXOL 350 MG/ML SOLN
50.0000 mL | Freq: Once | INTRAVENOUS | Status: AC | PRN
  Administered 2022-07-07: 50 mL via INTRAVENOUS

## 2022-07-07 NOTE — Telephone Encounter (Signed)
Advair is safe to take with nadolol.   Nadolol is a beta blocker. A component of advair is a beta stimulant. The inhaled medicines are mostly absorbed by the lung and are in such low doses that it is rare they cause issues with her heart medication.  Thanks, JD

## 2022-07-07 NOTE — Telephone Encounter (Signed)
Spoke with patient went over recommendations from Dr. Erin Fulling. She verbalized understanding. NFN

## 2022-07-18 NOTE — Progress Notes (Signed)
CT with stable 7 mm RLL nodule, considered benign. Lungs otherwise clear. Otherwise, no new findings. F/u with Dr. Francine Graven as scheduled. Thanks.

## 2022-07-18 NOTE — Progress Notes (Signed)
Spoke with pt and notified of results per Katie.  Pt verbalized understanding and denied any questions. 

## 2022-07-22 ENCOUNTER — Encounter: Payer: Self-pay | Admitting: *Deleted

## 2022-07-25 ENCOUNTER — Ambulatory Visit: Payer: Medicare Other | Attending: Cardiology | Admitting: Internal Medicine

## 2022-07-25 ENCOUNTER — Ambulatory Visit (INDEPENDENT_AMBULATORY_CARE_PROVIDER_SITE_OTHER): Payer: Medicare Other

## 2022-07-25 ENCOUNTER — Encounter: Payer: Self-pay | Admitting: Internal Medicine

## 2022-07-25 VITALS — BP 116/62 | HR 70 | Ht 66.5 in | Wt 167.2 lb

## 2022-07-25 DIAGNOSIS — I2510 Atherosclerotic heart disease of native coronary artery without angina pectoris: Secondary | ICD-10-CM | POA: Insufficient documentation

## 2022-07-25 DIAGNOSIS — Z720 Tobacco use: Secondary | ICD-10-CM | POA: Diagnosis not present

## 2022-07-25 DIAGNOSIS — Z961 Presence of intraocular lens: Secondary | ICD-10-CM | POA: Diagnosis not present

## 2022-07-25 DIAGNOSIS — I700 Atherosclerosis of aorta: Secondary | ICD-10-CM | POA: Diagnosis not present

## 2022-07-25 DIAGNOSIS — I2583 Coronary atherosclerosis due to lipid rich plaque: Secondary | ICD-10-CM | POA: Diagnosis not present

## 2022-07-25 DIAGNOSIS — R002 Palpitations: Secondary | ICD-10-CM

## 2022-07-25 DIAGNOSIS — H4323 Crystalline deposits in vitreous body, bilateral: Secondary | ICD-10-CM | POA: Diagnosis not present

## 2022-07-25 NOTE — Progress Notes (Signed)
Cardiology Office Note:    Date:  07/25/2022   ID:  Nicole Barr, DOB 08/04/1945, MRN [id number]  PCP:  Irven Coe, MD    [facility]  Cardiologist:  Christell Constant, MD  Advanced Practice Provider:  No care team member to display Electrophysiologist:  None      CC: CAD f/u   History of Present Illness:    Nicole Barr is a 77 y.o. female with a hx of non-obstructive CAD and Carotid disease s/p CEA; alcoholic cirrhosis on transplant list ([facility]) with varices.  Through Dr. Lindaann Slough Care [phone]) found to have moderate pLAD disease; no statin with cirrhosis.  Had minimal CAS plaque; was advised for one year follow up 04/2019.  Aortic atherosclerosis. 2022: no issues; was going to the gym. 2023: pre-operative eval completed; re-discussed ASA- found to have a colorectal polyp; has it removed.Saw Dr. Cliffton Asters.  Patient notes that she is doing ok.   No chest pain or pressure .  No SOB/DOE and no PND/Orthopnea.  No weight gain or leg swelling.  AT the gym her heart rate was gong fast.  Also having palpitations with vacuuming.    Has anxiety and has very rare cigarrete.   Past Medical History:  Diagnosis Date   Alcoholic cirrhosis    Ascites    Coronary artery disease    Endometriosis    GERD (gastroesophageal reflux disease)    Hypertension    Liver cyst     Past Surgical History:  Procedure Laterality Date   CAROTID ENDARTERECTOMY Right 04/07/2006   egd with banding  04/07/2014   with bleeding after wards done at [facility]   ESOPHAGOGASTRODUODENOSCOPY (EGD) WITH PROPOFOL N/A 05/02/2015   Procedure: ESOPHAGOGASTRODUODENOSCOPY (EGD) WITH PROPOFOL;  Surgeon: Willis Modena, MD;  Location: [facility];  Service: Endoscopy;  Laterality: N/A;   ESOPHAGOGASTRODUODENOSCOPY (EGD) WITH PROPOFOL N/A 06/06/2015   Procedure: ESOPHAGOGASTRODUODENOSCOPY (EGD) WITH PROPOFOL;  Surgeon: Willis Modena, MD;  Location: [facility];  Service: Endoscopy;   Laterality: N/A;   GASTRIC VARICES BANDING N/A 05/02/2015   Procedure: GASTRIC VARICES BANDING;  Surgeon: Willis Modena, MD;  Location: [facility];  Service: Endoscopy;  Laterality: N/A;   HEMORROIDECTOMY  2008   banding   LAMINECTOMY  04/08/1987   lumbar 4-5   LAPAROSCOPY  04/08/1987   adhesions   oophrectomy Left 04/08/1987   TONSILLECTOMY     age 73   TRANSANAL EXCISION OF RECTAL MASS N/A 07/31/2021   Procedure: TRANSANAL EXCISION OF DISTAL RECTAL POLYPOID MASS;  Surgeon: Andria Meuse, MD;  Location: [facility];  Service: General;  Laterality: N/A;   UMBILICAL HERNIA REPAIR  2020    Current Medications: Current Meds  Medication Sig   Alirocumab (PRALUENT) 75 MG/ML SOAJ INJECT 75MG  INTO THE SKIN EVERY 14 DAYS   aspirin 81 MG EC tablet Take 81 mg by mouth every other day. PRN   b complex vitamins capsule Take 1 capsule by mouth daily.   Cholecalciferol 25 MCG (1000 UT) tablet Take 1,000 Units by mouth See admin instructions. Every other day   fluticasone-salmeterol (ADVAIR HFA) 45-21 MCG/ACT inhaler Inhale 2 puffs into the lungs 2 (two) times daily. (Patient taking differently: Inhale 2 puffs into the lungs as needed.)   furosemide (LASIX) 20 MG tablet Take 20 mg by mouth daily.   KRILL OIL PO Take 1 tablet by mouth daily.   lactulose (CHRONULAC) 10 GM/15ML solution 3-4 tablespoons daily by mouth   nadolol (CORGARD) 20 MG tablet Take  10 mg by mouth daily.   rifaximin (XIFAXAN) 550 MG TABS tablet Take 550 mg by mouth 2 (two) times daily.   spironolactone (ALDACTONE) 50 MG tablet Take 50 mg by mouth every morning.   Vitamin D-Vitamin K (VITAMIN K2-VITAMIN D3 PO) Take 1 tablet by mouth daily.     Allergies:   Carvedilol, Doxepin hcl, and Trazodone hcl   Social History   Socioeconomic History   Marital status: Single    Spouse name: Not on file   Number of children: Not on file   Years of education: Not on file   Highest education level: Not on file  Occupational  History   Not on file  Tobacco Use   Smoking status: Some Days    Packs/day: 0.50    Years: 35.00    Additional pack years: 0.00    Total pack years: 17.50    Types: Cigarettes    Start date: 04/17/1968   Smokeless tobacco: Never  Vaping Use   Vaping Use: Never used  Substance and Sexual Activity   Alcohol use: No    Alcohol/week: 10.0 standard drinks of alcohol    Types: 10 Glasses of wine per week    Comment: currently states she is not drinking but states she usually had 2 glasses of wine per evening   Drug use: No   Sexual activity: Not on file  Other Topics Concern   Not on file  Social History Narrative   Not on file   Social Determinants of Health   Financial Resource Strain: Not on file  Food Insecurity: Not on file  Transportation Needs: Not on file  Physical Activity: Not on file  Stress: Not on file  Social Connections: Not on file    Social:  Former alcohol; complicated divorce, former Biomedical scientist at [employer]  Family History: The patient's family history includes Cancer in her mother; Diabetes in her mother; Heart failure in her father and maternal grandmother; Hyperlipidemia in her mother; Hypertension in her mother; Lupus in her mother; Vitiligo in her mother.  ROS:   Please see the history of present illness.     All other systems reviewed and are negative.  EKGs/Labs/Other Studies Reviewed:    The following studies were reviewed today:  EKG:   07/25/22: SR rate 67 06/18/21: SR rate 60 06/15/20: Sinus bradycardia rate 57 WNL 05/13/19: SR rate 66 WNL  Cardiac Studies & Procedures     STRESS TESTS  ECHOCARDIOGRAM STRESS TEST 10/12/2014   ECHOCARDIOGRAM  ECHOCARDIOGRAM COMPLETE 07/05/2020  Narrative ECHOCARDIOGRAM REPORT    Patient Name:   Nicole Barr Date of Exam: 07/05/2020 Medical Rec #:  [id number]            Height:       66.0 in Accession #:    [id number]           Weight:       167.0 lb Date of Birth:  Jun 24, 1945           BSA:           1.852 m Patient Age:    74 years             BP:           112/60 mmHg Patient Gender: F                    HR:           66 bpm. Exam Location:  [address]  Procedure: 2D Echo, 3D Echo, Cardiac Doppler and Color Doppler  Indications:    I34.0 Mitral Valve Regurgitation  History:        Patient has prior history of Echocardiogram examinations, most recent 07/26/2014. CAD; Risk Factors:Family History of Coronary Artery Disease, Dyslipidemia and Former Smoker. History of ETOH Abuse with Alcoholic Cirrhosis, and Chronic Kidney Disease.  Sonographer:    Farrel Conners RDCS Referring Phys: [id number] [facility] Kasean Denherder  IMPRESSIONS   1. Left ventricular ejection fraction, by estimation, is 70 to 75%. The left ventricle has hyperdynamic function. The left ventricle has no regional wall motion abnormalities. Left ventricular diastolic parameters are indeterminate. 2. Right ventricular systolic function is normal. The right ventricular size is normal. There is normal pulmonary artery systolic pressure. The estimated right ventricular systolic pressure is 25.1 mmHg. 3. Left atrial size was mildly dilated. 4. Right atrial size was mildly dilated. 5. The mitral valve is grossly normal. Trivial mitral valve regurgitation. No evidence of mitral stenosis. 6. The aortic valve is tricuspid. There is mild calcification of the aortic valve. Aortic valve regurgitation is not visualized. 7. The inferior vena cava is dilated in size with >50% respiratory variability, suggesting right atrial pressure of 8 mmHg.  FINDINGS Left Ventricle: Left ventricular ejection fraction, by estimation, is 70 to 75%. The left ventricle has hyperdynamic function. The left ventricle has no regional wall motion abnormalities. The left ventricular internal cavity size was normal in size. There is no left ventricular hypertrophy. Left ventricular diastolic parameters are indeterminate.  Right Ventricle: The right  ventricular size is normal. No increase in right ventricular wall thickness. Right ventricular systolic function is normal. There is normal pulmonary artery systolic pressure. The tricuspid regurgitant velocity is 2.35 m/s, and with an assumed right atrial pressure of 3 mmHg, the estimated right ventricular systolic pressure is 25.1 mmHg.  Left Atrium: Left atrial size was mildly dilated.  Right Atrium: Right atrial size was mildly dilated.  Pericardium: Trivial pericardial effusion is present. Presence of pericardial fat pad.  Mitral Valve: The mitral valve is grossly normal. Trivial mitral valve regurgitation. No evidence of mitral valve stenosis.  Tricuspid Valve: The tricuspid valve is grossly normal. Tricuspid valve regurgitation is mild . No evidence of tricuspid stenosis.  Aortic Valve: The aortic valve is tricuspid. There is mild calcification of the aortic valve. Aortic valve regurgitation is not visualized.  Pulmonic Valve: The pulmonic valve was grossly normal. Pulmonic valve regurgitation is not visualized. No evidence of pulmonic stenosis.  Aorta: The aortic root and ascending aorta are structurally normal, with no evidence of dilitation.  Venous: The inferior vena cava is dilated in size with greater than 50% respiratory variability, suggesting right atrial pressure of 8 mmHg.  IAS/Shunts: The atrial septum is grossly normal.   LEFT VENTRICLE PLAX 2D LVIDd:         4.20 cm  Diastology LVIDs:         3.00 cm  LV e' medial:    7.94 cm/s LV PW:         1.00 cm  LV E/e' medial:  11.7 LV IVS:        0.70 cm  LV e' lateral:   6.53 cm/s LVOT diam:     1.90 cm  LV E/e' lateral: 14.2 LV SV:         78 LV SV Index:   42 LVOT Area:     2.84 cm  3D Volume EF: 3D EF:  74 % LV EDV:       96 ml LV ESV:       25 ml LV SV:        71 ml  RIGHT VENTRICLE RV S prime:     17.80 cm/s TAPSE (M-mode): 2.8 cm  LEFT ATRIUM             Index       RIGHT ATRIUM            Index LA diam:        4.20 cm 2.27 cm/m  RA Area:     18.70 cm LA Vol (A2C):   75.3 ml 40.65 ml/m RA Volume:   53.90 ml  29.10 ml/m LA Vol (A4C):   72.0 ml 38.87 ml/m LA Biplane Vol: 76.9 ml 41.52 ml/m AORTIC VALVE LVOT Vmax:   109.00 cm/s LVOT Vmean:  68.800 cm/s LVOT VTI:    0.275 m  AORTA Ao Root diam: 3.30 cm Ao Asc diam:  3.20 cm  MITRAL VALVE               TRICUSPID VALVE MV Area (PHT)  cm         TR Peak grad:   22.1 mmHg MV Decel Time: 235 msec    TR Vmax:        235.00 cm/s MV E velocity: 92.75 cm/s MV A velocity: 74.80 cm/s  SHUNTS MV E/A ratio:  1.24        Systemic VTI:  0.28 m Systemic Diam: 1.90 cm  Lennie Odor MD Electronically signed by Lennie Odor MD Signature Date/Time: 07/05/2020/11:08:41 AM    Final     CT SCANS  CT CORONARY MORPH W/CTA COR W/SCORE 10/26/2014  Addendum 10/26/2014  6:05 PM ADDENDUM REPORT: 10/26/2014 18:03  CLINICAL DATA:  77 year old female with alcoholic liver cirrhosis and equivocal stress echocardiogram. H/o PAD with carotid endarterectomy.  EXAM: Cardiac/Coronary  CT  TECHNIQUE: The patient was scanned on a Philips 256 scanner.  FINDINGS: A 120 kV prospective scan was triggered in the descending thoracic aorta at 111 HU's. Axial non-contrast 3 mm slices were carried out through the heart. The data set was analyzed on a dedicated work station and scored using the Agatson method. Gantry rotation speed was 270 msecs and collimation was .9 mm. No beta blockade and 0.4 mg of sl NTG was given. The 3D data set was reconstructed in 5% intervals of the 67-82 % of the R-R cycle. Diastolic phases were analyzed on a dedicated work station using MPR, MIP and VRT modes. The patient received 80 cc of contrast.  Aorta:  Normal caliber, no dissection.  Mild diffuse calcifications.  Aortic Valve:  Trileaflet, no calcifications.  Coronary Arteries: Normal coronary origin. There is right and left codominance.  RCA is a  large vessel that gives rise to two acute marginal branches, a PDA and a very small PLA. There is a moderate calcified plaque in the proximal to mid RCA associated with 25-50% stenosis.  Left main is a large vessel that gives rise top LAD and LCX arteries.  Distal left main has a long calcified plaque that extends into the proximal LAD and is associated with 25-50% stenosis.  LAD has a long moderate mixed predominantly calcified plaque in the proximal segment associated with 50-69% stenosis. Mid and distal LAD has only minimal plaque.  A fairly large diagonal branch, its proximal segment is affected by a cardiac motion, however that doesn't appear to be significant stenosis.  LCX gives rise to two obtuse marginal branches and a PLA. There is minimal plaque.  Other findings:  Normal pulmonary vein drainage.  A large left atrial appendage has no filling defect.  IMPRESSION: 1. Coronary calcium score of 335. This was 44 percentile for age and sex matched control.  2. Normal coronary origin.  There is right and left co-dominance.  3. There is moderate non-obstructive CAD in the proximal LAD. Diffuse calcified plaque.  An aggressive risk factor modification is recommended.  Nicole Barr   Electronically Signed By: Nicole Barr On: 10/26/2014 18:03  Narrative EXAM: OVER-READ INTERPRETATION  CT CHEST  The following report is an over-read performed by radiologist Dr. Royal Piedra [facility] on 10/25/2014. This over-read does not include interpretation of cardiac or coronary anatomy or pathology. The coronary calcium score/coronary CTA interpretation by the cardiologist is attached.  COMPARISON:  No priors.  FINDINGS: 6 mm right lower lobe nodule (image 43 of series 204). 3 mm calcified granuloma in the left lower lobe. Within the visualized portions of the thorax there is no acute consolidative airspace disease, no pleural effusion, no  pneumothorax and no lymphadenopathy. Numerous calcified mediastinal and left hilar lymph nodes are incidentally noted. Visualized portions of the upper abdomen demonstrate a very nodular liver contour, and hypertrophy of the caudate lobe, suggestive of underlying cirrhosis. There is an incompletely visualized 2.1 cm lesion in the posterior aspect of segment 2 of the liver (image 58 of series 504), which is not characterized. There are no aggressive appearing lytic or blastic lesions noted in the visualized portions of the skeleton.  IMPRESSION: 1. 6 mm right lower lobe pulmonary nodule. If the patient is at high risk for bronchogenic carcinoma, follow-up chest CT at 6-12 months is recommended. If the patient is at low risk for bronchogenic carcinoma, follow-up chest CT at 12 months is recommended. This recommendation follows the consensus statement: Guidelines for Management of Small Pulmonary Nodules Detected on CT Scans: A Statement from the Fleischner Society as published in Radiology 2005;237:395-400. 2. Morphologic changes in the liver compatible with cirrhosis. Incompletely visualized and incompletely characterized 2.1 cm lesion in segment 2 of the liver. Further characterization with nonemergent MRI of the abdomen with and without IV gadolinium is recommended in the near future to better evaluate this finding.  Electronically Signed: By: Trudie Reed M.D. On: 10/25/2014 10:33            Recent Labs: 07/31/2021: ALT 19 07/02/2022: BUN 17; Creatinine, Ser 1.08; Potassium 3.9; Sodium 139  Recent Lipid Panel    Component Value Date/Time   CHOL 161 11/02/2020 0731   TRIG 84 11/02/2020 0731   HDL 70 11/02/2020 0731   CHOLHDL 2.3 11/02/2020 0731   LDLCALC 75 11/02/2020 0731     Physical Exam:    VS:  BP 116/62 (BP Location: Left Arm, Patient Position: Sitting, Cuff Size: Normal)   Pulse 70   Ht 5' 6.5" (1.689 m)   Wt 167 lb 3.2 oz (75.8 kg)   SpO2 94%   BMI  26.58 kg/m     Wt Readings from Last 3 Encounters:  07/25/22 167 lb 3.2 oz (75.8 kg)  07/02/22 166 lb (75.3 kg)  02/11/22 160 lb (72.6 kg)   Gen: No distress Neck: No JVD, L carotid bruit Cardiac: No Rubs or Gallops, no murmur, RRR +2 radial pulses Respiratory: Clear to auscultation bilaterally, normal effort, normal  respiratory rate GI: Soft, nontender, non-distended  MS: No  edema;  moves all extremities Integument: Skin feels well Neuro:  At time of evaluation, alert and oriented to person/place/time/situation  Psych: Normal affect, patient feels well   ASSESSMENT:    1. Palpitations   2. Coronary artery disease due to lipid rich plaque   3. Aortic atherosclerosis   4. Tobacco use     PLAN:    Coronary Artery Disease; Nonobstructive Aortic Atherosclerosis HLD - asymptomatic  - anatomy: pLAD disease - ASA stopped for bleeding; restart when OK per GI - continue PCSK9i; she cannot tolerate other therapy  Palpitations  Overweight - ziopatch for palpitations - continue BB - she is planning to see an obesity specialist; if they do not offer her an intervention for this she would like to come off nadolol and her hepatologist is ok with this; we can try another therapy; she notes the possibility that something outside of the carvedilol causes issues in 2015  Hx of cirrhosis - diuretic therapy as per hepatology  Carotid Artery Disease; Nonobstructive - asymptomatic - ASA when able - continue PCSK9i, goal LDL < 70 - repeat carotid study in 04/2023  Tobacco abuse (relapse)  - discussed coping strategies  MVP - minimal of the anterior leaflet without MR and MAD - not planned for screening imaging at this time - reviewed images with patients  January 2025 me or team   Medication Adjustments/Labs and Tests Ordered: Current medicines are reviewed at length with the patient today.  Concerns regarding medicines are outlined above.  Orders Placed This Encounter   Procedures   LONG TERM MONITOR (3-14 DAYS)   EKG 12-Lead   No orders of the defined types were placed in this encounter.   Patient Instructions  Medication Instructions:  Your physician recommends that you continue on your current medications as directed. Please refer to the Current Medication list given to you today.  *If you need a refill on your cardiac medications before your next appointment, please call your pharmacy*   Lab Work: NONE If you have labs (blood work) drawn today and your tests are completely normal, you will receive your results only by: MyChart Message (if you have MyChart) OR A paper copy in the mail If you have any lab test that is abnormal or we need to change your treatment, we will call you to review the results.   Testing/Procedures: JAN 2025: Your physician has requested that you have a carotid duplex. This test is an ultrasound of the carotid arteries in your neck. It looks at blood flow through these arteries that supply the brain with blood. Allow one hour for this exam. There are no restrictions or special instructions.   Your physician has requested that you wear a heart monitor.    Follow-Up: At [facility], you and your health needs are our priority.  As part of our continuing mission to provide you with exceptional heart care, we have created designated Provider Care Teams.  These Care Teams include your primary Cardiologist (physician) and Advanced Practice Providers (APPs -  Physician Assistants and Nurse Practitioners) who all work together to provide you with the care you need, when you need it.  We recommend signing up for the patient portal called "MyChart".  Sign up information is provided on this After Visit Summary.  MyChart is used to connect with patients for Virtual Visits (Telemedicine).  Patients are able to view lab/test results, encounter notes, upcoming appointments, etc.  Non-urgent messages can be sent to your provider  as well.  To learn more about what you can do with MyChart, go to [email or web address].    Your next appointment:   9 month(s)  Provider:   Christell Constant, MD     Other Instructions Christena Deem- Long Term Monitor Instructions  Your physician has requested you wear a ZIO patch monitor for 7 days.  This is a single patch monitor. [employer] supplies one patch monitor per enrollment. Additional stickers are not available. Please do not apply patch if you will be having a Nuclear Stress Test,   Cardiac CT, MRI, or Chest Xray during the period you would be wearing the  monitor. The patch cannot be worn during these tests. You cannot remove and re-apply the  ZIO XT patch monitor.  Your ZIO patch monitor will be mailed 3 day USPS to your address on file. It may take 3-5 days  to receive your monitor after you have been enrolled.  Once you have received your monitor, please review the enclosed instructions. Your monitor  has already been registered assigning a specific monitor serial # to you.  Billing and Patient Assistance Program Information  We have supplied [employer] with any of your insurance information on file for billing purposes. [employer] offers a sliding scale Patient Assistance Program for patients that do not have  insurance, or whose insurance does not completely cover the cost of the ZIO monitor.  You must apply for the Patient Assistance Program to qualify for this discounted rate.  To apply, please call [employer] at [phone], select option 4, select option 2, ask to apply for  Patient Assistance Program. Meredeth Ide will ask your household income, and how many people  are in your household. They will quote your out-of-pocket cost based on that information.  [employer] will also be able to set up a 53-month, interest-free payment plan if needed.  Applying the monitor   Shave hair from upper left chest.  Hold abrader disc by orange tab. Rub abrader in 40 strokes over the upper  left chest as  indicated in your monitor instructions.  Clean area with 4 enclosed alcohol pads. Let dry.  Apply patch as indicated in monitor instructions. Patch will be placed under collarbone on left  side of chest with arrow pointing upward.  Rub patch adhesive wings for 2 minutes. Remove white label marked "1". Remove the white  label marked "2". Rub patch adhesive wings for 2 additional minutes.  While looking in a mirror, press and release button in center of patch. A small green light will  flash 3-4 times. This will be your only indicator that the monitor has been turned on.  Do not shower for the first 24 hours. You may shower after the first 24 hours.  Press the button if you feel a symptom. You will hear a small click. Record Date, Time and  Symptom in the Patient Logbook.  When you are ready to remove the patch, follow instructions on the last 2 pages of Patient  Logbook. Stick patch monitor onto the last page of Patient Logbook.  Place Patient Logbook in the blue and white box. Use locking tab on box and tape box closed  securely. The blue and white box has prepaid postage on it. Please place it in the mailbox as  soon as possible. Your physician should have your test results approximately 7 days after the  monitor has been mailed back to [facility].  Call [facility] Customer Care at [phone] if you have questions regarding  your ZIO  XT patch monitor. Call them immediately if you see an orange light blinking on your  monitor.  If your monitor falls off in less than 4 days, contact our Monitor department at [phone].  If your monitor becomes loose or falls off after 4 days call [employer] at [phone] for  suggestions on securing your monitor     Signed, Christell Constant, MD  07/25/2022 2:49 PM    [facility]

## 2022-07-25 NOTE — Patient Instructions (Signed)
Medication Instructions:  Your physician recommends that you continue on your current medications as directed. Please refer to the Current Medication list given to you today.  *If you need a refill on your cardiac medications before your next appointment, please call your pharmacy*   Lab Work: NONE If you have labs (blood work) drawn today and your tests are completely normal, you will receive your results only by: MyChart Message (if you have MyChart) OR A paper copy in the mail If you have any lab test that is abnormal or we need to change your treatment, we will call you to review the results.   Testing/Procedures: JAN 2025: Your physician has requested that you have a carotid duplex. This test is an ultrasound of the carotid arteries in your neck. It looks at blood flow through these arteries that supply the brain with blood. Allow one hour for this exam. There are no restrictions or special instructions.   Your physician has requested that you wear a heart monitor.    Follow-Up: At [facility], you and your health needs are our priority.  As part of our continuing mission to provide you with exceptional heart care, we have created designated Provider Care Teams.  These Care Teams include your primary Cardiologist (physician) and Advanced Practice Providers (APPs -  Physician Assistants and Nurse Practitioners) who all work together to provide you with the care you need, when you need it.  We recommend signing up for the patient portal called "MyChart".  Sign up information is provided on this After Visit Summary.  MyChart is used to connect with patients for Virtual Visits (Telemedicine).  Patients are able to view lab/test results, encounter notes, upcoming appointments, etc.  Non-urgent messages can be sent to your provider as well.   To learn more about what you can do with MyChart, go to [email or web address].    Your next appointment:   9 month(s)  Provider:    Christell Constant, MD     Other Instructions Nicole Barr- Long Term Monitor Instructions  Your physician has requested you wear a ZIO patch monitor for 7 days.  This is a single patch monitor. [employer] supplies one patch monitor per enrollment. Additional stickers are not available. Please do not apply patch if you will be having a Nuclear Stress Test,   Cardiac CT, MRI, or Chest Xray during the period you would be wearing the  monitor. The patch cannot be worn during these tests. You cannot remove and re-apply the  ZIO XT patch monitor.  Your ZIO patch monitor will be mailed 3 day USPS to your address on file. It may take 3-5 days  to receive your monitor after you have been enrolled.  Once you have received your monitor, please review the enclosed instructions. Your monitor  has already been registered assigning a specific monitor serial # to you.  Billing and Patient Assistance Program Information  We have supplied [employer] with any of your insurance information on file for billing purposes. [employer] offers a sliding scale Patient Assistance Program for patients that do not have  insurance, or whose insurance does not completely cover the cost of the ZIO monitor.  You must apply for the Patient Assistance Program to qualify for this discounted rate.  To apply, please call [employer] at [phone], select option 4, select option 2, ask to apply for  Patient Assistance Program. Nicole Barr will ask your household income, and how many people  are in your household. They will quote  your out-of-pocket cost based on that information.  [employer] will also be able to set up a 43-month interest-free payment plan if needed.  Applying the monitor   Shave hair from upper left chest.  Hold abrader disc by orange tab. Rub abrader in 40 strokes over the upper left chest as  indicated in your monitor instructions.  Clean area with 4 enclosed alcohol pads. Let dry.  Apply patch as indicated in monitor  instructions. Patch will be placed under collarbone on left  side of chest with arrow pointing upward.  Rub patch adhesive wings for 2 minutes. Remove white label marked "1". Remove the white  label marked "2". Rub patch adhesive wings for 2 additional minutes.  While looking in a mirror, press and release button in center of patch. A small green light will  flash 3-4 times. This will be your only indicator that the monitor has been turned on.  Do not shower for the first 24 hours. You may shower after the first 24 hours.  Press the button if you feel a symptom. You will hear a small click. Record Date, Time and  Symptom in the Patient Logbook.  When you are ready to remove the patch, follow instructions on the last 2 pages of Patient  Logbook. Stick patch monitor onto the last page of Patient Logbook.  Place Patient Logbook in the blue and white box. Use locking tab on box and tape box closed  securely. The blue and white box has prepaid postage on it. Please place it in the mailbox as  soon as possible. Your physician should have your test results approximately 7 days after the  monitor has been [facility]  Call ISidneyat [phone] you have questions regarding  your ZIO XT patch monitor. Call them immediately if you see an orange light blinking on your  monitor.  If your monitor falls off in less than 4 days, contact our Monitor department at [phone]  If your monitor becomes loose or falls off after 4 days call [employer] at [phone]  suggestions on securing your monitor

## 2022-07-25 NOTE — Progress Notes (Unsigned)
Enrolled patient for a 7 day Zio XT monitor to be mailed to patients home.  

## 2022-07-28 ENCOUNTER — Ambulatory Visit: Payer: Medicare Other | Admitting: Cardiology

## 2022-07-29 DIAGNOSIS — H4323 Crystalline deposits in vitreous body, bilateral: Secondary | ICD-10-CM | POA: Diagnosis not present

## 2022-07-29 DIAGNOSIS — H35373 Puckering of macula, bilateral: Secondary | ICD-10-CM | POA: Diagnosis not present

## 2022-08-04 DIAGNOSIS — R002 Palpitations: Secondary | ICD-10-CM

## 2022-08-13 ENCOUNTER — Ambulatory Visit: Payer: Medicare Other | Admitting: Pulmonary Disease

## 2022-08-13 NOTE — Progress Notes (Deleted)
Synopsis: Referred in November 2023 for bronchitis, self-referred  Subjective:   PATIENT ID: Nicole Barr GENDER: female DOB: 1945/12/24, MRN: [id number]  HPI  No chief complaint on file.  Nicole Barr is a 77 year old woman, daily smoker with alcoholic cirrhosis, coronary artery disease and GERD who is self-referred to pulmonary clinic for evaluation.   She was seen 07/02/22 by Nicole Croft, NP and started on advair HFA 45-7mcg 2 puffs twice daily. Her PFTs showed normal spirometry with evidence of air trapping.   Initial OV 02/11/22 She reports being treated for bronchitis about a month ago with Augmentin and prednisone taper which has improved her symptoms.   She has history of bronchitis episodes at least once per year. She reports seasonal allergies in the fall. She has gained about 30lbs and is hoping to increase her physical activity with running to help lose weight. She denies heart burn or reflux.   She is followed by GI for her cirrhosis and is on spironolactone and lasix.   She denies night time respiratory symptoms. She has trouble sleeping due to mental activity.    She is smoking 5 cigarettes per day. She has been smoking for 15 years. Retired from [employer]. 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 No pets at home.    Past Medical History:  Diagnosis Date   Alcoholic cirrhosis (HCC)    Ascites    Coronary artery disease    Endometriosis    GERD (gastroesophageal reflux disease)    Hypertension    Liver cyst      Family History  Problem Relation Age of Onset   Diabetes Mother    Cancer Mother    Hypertension Mother    Hyperlipidemia Mother    Lupus Mother    Vitiligo Mother    Heart failure Father    Heart failure Maternal Grandmother      Social History   Socioeconomic History   Marital status: Single    Spouse name: Not on file   Number of  children: Not on file   Years of education: Not on file   Highest education level: Not on file  Occupational History   Not on file  Tobacco Use   Smoking status: Some Days    Packs/day: 0.50    Years: 35.00    Additional pack years: 0.00    Total pack years: 17.50    Types: Cigarettes    Start date: 04/17/1968   Smokeless tobacco: Never  Vaping Use   Vaping Use: Never used  Substance and Sexual Activity   Alcohol use: No    Alcohol/week: 10.0 standard drinks of alcohol    Types: 10 Glasses of wine per week    Comment: currently states she is not drinking but states she usually had 2 glasses of wine per evening   Drug use: No   Sexual activity: Not on file  Other Topics Concern   Not on file  Social History Narrative   Not on file   Social Determinants of Health   Financial Resource Strain: Not on file  Food Insecurity: Not on file  Transportation Needs: Not on file  Physical Activity: Not on file  Stress: Not on file  Social Connections: Not on file  Intimate Partner Violence: Not on file     Allergies  Allergen Reactions   Carvedilol Other (See Comments)    Hypertension (  intolerance), Rapid Heart Rate Increases BP to 300 - per pt    Doxepin Hcl     Other reaction(s): does not do anything for her   Trazodone Hcl     Other reaction(s): in effective for sleep (2016)     Outpatient Medications Prior to Visit  Medication Sig Dispense Refill   albuterol (VENTOLIN HFA) 108 (90 Base) MCG/ACT inhaler Inhale 2 puffs into the lungs every 4 (four) hours as needed for wheezing or shortness of breath. (Patient not taking: Reported on 07/25/2022)     Alirocumab (PRALUENT) 75 MG/ML SOAJ INJECT 75MG  INTO THE SKIN EVERY 14 DAYS 6 mL 0   aspirin 81 MG EC tablet Take 81 mg by mouth every other day. PRN     b complex vitamins capsule Take 1 capsule by mouth daily.     Cholecalciferol 25 MCG (1000 UT) tablet Take 1,000 Units by mouth See admin instructions. Every other day      Coenzyme Q10-Vitamin E (QUNOL ULTRA COQ10) 100-150 MG-UNIT CAPS Take 1 tablet by mouth in the morning. (Patient not taking: Reported on 07/25/2022)     fluticasone-salmeterol (ADVAIR HFA) 45-21 MCG/ACT inhaler Inhale 2 puffs into the lungs 2 (two) times daily. (Patient taking differently: Inhale 2 puffs into the lungs as needed.) 1 each 12   furosemide (LASIX) 20 MG tablet Take 20 mg by mouth daily.     gabapentin (NEURONTIN) 100 MG capsule Take 2 capsules (200 mg total) by mouth at bedtime. (Patient not taking: Reported on 07/25/2022) 180 capsule 3   KRILL OIL PO Take 1 tablet by mouth daily.     lactulose (CHRONULAC) 10 GM/15ML solution 3-4 tablespoons daily by mouth     nadolol (CORGARD) 20 MG tablet Take 10 mg by mouth daily.     rifaximin (XIFAXAN) 550 MG TABS tablet Take 550 mg by mouth 2 (two) times daily.     spironolactone (ALDACTONE) 50 MG tablet Take 50 mg by mouth every morning.     Vitamin D-Vitamin K (VITAMIN K2-VITAMIN D3 PO) Take 1 tablet by mouth daily.     No facility-administered medications prior to visit.    Review of Systems  Constitutional:  Negative for chills, fever, malaise/fatigue and weight loss.  HENT:  Negative for congestion, sinus pain and sore throat.   Eyes: Negative.   Respiratory:  Positive for shortness of breath. Negative for cough, hemoptysis, sputum production and wheezing.   Cardiovascular:  Negative for chest pain, palpitations, orthopnea, claudication and leg swelling.  Gastrointestinal:  Negative for abdominal pain, heartburn, nausea and vomiting.  Genitourinary: Negative.   Musculoskeletal:  Negative for joint pain and myalgias.  Skin:  Negative for rash.  Neurological:  Negative for weakness.  Endo/Heme/Allergies: Negative.   Psychiatric/Behavioral: Negative.     Objective:   There were no vitals filed for this visit.  Physical Exam Constitutional:      General: She is not in acute distress.    Appearance: She is not ill-appearing.   HENT:     Head: Normocephalic and atraumatic.  Eyes:     General: No scleral icterus.    Conjunctiva/sclera: Conjunctivae normal.     Pupils: Pupils are equal, round, and reactive to light.  Cardiovascular:     Rate and Rhythm: Normal rate and regular rhythm.     Pulses: Normal pulses.     Heart sounds: Normal heart sounds. No murmur heard. Pulmonary:     Effort: Pulmonary effort is normal.     Breath  sounds: Normal breath sounds. No wheezing, rhonchi or rales.  Abdominal:     General: Bowel sounds are normal.     Palpations: Abdomen is soft.  Musculoskeletal:     Right lower leg: No edema.     Left lower leg: No edema.  Lymphadenopathy:     Cervical: No cervical adenopathy.  Skin:    General: Skin is warm and dry.  Neurological:     General: No focal deficit present.     Mental Status: She is alert.  Psychiatric:        Mood and Affect: Mood normal.        Behavior: Behavior normal.        Thought Content: Thought content normal.        Judgment: Judgment normal.    CBC    Component Value Date/Time   WBC 4.6 07/23/2021 0845   RBC 4.38 07/23/2021 0845   HGB 13.2 07/23/2021 0845   HGB 13.0 05/13/2019 0923   HCT 38.7 07/23/2021 0845   HCT 37.8 05/13/2019 0923   PLT 218 07/23/2021 0845   PLT 227 05/13/2019 0923   MCV 88.4 07/23/2021 0845   MCV 90 05/13/2019 0923   MCH 30.1 07/23/2021 0845   MCHC 34.1 07/23/2021 0845   RDW 13.4 07/23/2021 0845   RDW 12.1 05/13/2019 0923   LYMPHSABS 1.4 07/23/2021 0845   LYMPHSABS 1.4 04/21/2018 0734   MONOABS 0.5 07/23/2021 0845   EOSABS 0.2 07/23/2021 0845   EOSABS 0.2 04/21/2018 0734   BASOSABS 0.0 07/23/2021 0845   BASOSABS 0.0 04/21/2018 0734      Latest Ref Rng & Units 07/02/2022    3:18 PM 07/31/2021   11:55 AM 07/24/2020    8:43 AM  BMP  Glucose 70 - 99 mg/dL 829  562  130   BUN 6 - 23 mg/dL 17  12  17    Creatinine 0.40 - 1.20 mg/dL 8.65  7.84  6.96   Sodium 135 - 145 mEq/L 139  139  139   Potassium 3.5 - 5.1  mEq/L 3.9  3.6  3.6   Chloride 96 - 112 mEq/L 105  105  101   CO2 19 - 32 mEq/L 25  25  30    Calcium 8.4 - 10.5 mg/dL 9.7  9.7  29.5     Chest imaging: CT Chest 11/2016 Cardiovascular: Normal heart size. No pericardial effusion. Atherosclerosis of the aorta and coronaries. Status post coronary calcium scoring in 2016. No acute finding in the great vessels.   Mediastinum/Nodes: Calcified mediastinal lymph nodes. No enlarged lymph nodes.   Lungs/Pleura: The 6 mm right lower lobe pulmonary nodule seen on 3:111 is size stable compared to 2016. Size is mildly overestimated due to closely neighboring vessel. No new pulmonary nodule is seen. There is no edema, consolidation, effusion, or pneumothorax. PFT:    Latest Ref Rng & Units 07/02/2022    8:55 AM  PFT Results  FVC-Pre L 3.16   FVC-Predicted Pre % 101   FVC-Post L 3.07   FVC-Predicted Post % 98   Pre FEV1/FVC % % 78   Post FEV1/FCV % % 77   FEV1-Pre L 2.47   FEV1-Predicted Pre % 104   FEV1-Post L 2.36   DLCO uncorrected ml/min/mmHg 16.72   DLCO UNC% % 80   DLCO corrected ml/min/mmHg 16.72   DLCO COR %Predicted % 80   DLVA Predicted % 77   TLC L 6.38   TLC % Predicted % 117  RV % Predicted % 131     Labs:  Path:  Echo 2022: LV EF 70-75%. Hyperdynamic function. RV systolic function and size is normal. LA and RA are mildly dilated.   Heart Catheterization:  Assessment & Plan:   No diagnosis found.  Discussion: Nicole Barr is a 77 year old woman, daily smoker with alcoholic cirrhosis, coronary artery disease and GERD who is self-referred to pulmonary clinic for evaluation.   She has history of recurrent episodes of bronchitis. She is to use albuterol inhaler 1-2 puffs every 4-6 hours for cough, wheezing or dyspnea.  We will check a chest x-ray today and PFTs at follow up.  Follow up in 3 months.   Melody Comas, MD [facility] Office: [phone]   Current Outpatient  Medications:    albuterol (VENTOLIN HFA) 108 (90 Base) MCG/ACT inhaler, Inhale 2 puffs into the lungs every 4 (four) hours as needed for wheezing or shortness of breath. (Patient not taking: Reported on 07/25/2022), Disp: , Rfl:    Alirocumab (PRALUENT) 75 MG/ML SOAJ, INJECT 75MG  INTO THE SKIN EVERY 14 DAYS, Disp: 6 mL, Rfl: 0   aspirin 81 MG EC tablet, Take 81 mg by mouth every other day. PRN, Disp: , Rfl:    b complex vitamins capsule, Take 1 capsule by mouth daily., Disp: , Rfl:    Cholecalciferol 25 MCG (1000 UT) tablet, Take 1,000 Units by mouth See admin instructions. Every other day, Disp: , Rfl:    Coenzyme Q10-Vitamin E (QUNOL ULTRA COQ10) 100-150 MG-UNIT CAPS, Take 1 tablet by mouth in the morning. (Patient not taking: Reported on 07/25/2022), Disp: , Rfl:    fluticasone-salmeterol (ADVAIR HFA) 45-21 MCG/ACT inhaler, Inhale 2 puffs into the lungs 2 (two) times daily. (Patient taking differently: Inhale 2 puffs into the lungs as needed.), Disp: 1 each, Rfl: 12   furosemide (LASIX) 20 MG tablet, Take 20 mg by mouth daily., Disp: , Rfl:    gabapentin (NEURONTIN) 100 MG capsule, Take 2 capsules (200 mg total) by mouth at bedtime. (Patient not taking: Reported on 07/25/2022), Disp: 180 capsule, Rfl: 3   KRILL OIL PO, Take 1 tablet by mouth daily., Disp: , Rfl:    lactulose (CHRONULAC) 10 GM/15ML solution, 3-4 tablespoons daily by mouth, Disp: , Rfl:    nadolol (CORGARD) 20 MG tablet, Take 10 mg by mouth daily., Disp: , Rfl:    rifaximin (XIFAXAN) 550 MG TABS tablet, Take 550 mg by mouth 2 (two) times daily., Disp: , Rfl:    spironolactone (ALDACTONE) 50 MG tablet, Take 50 mg by mouth every morning., Disp: , Rfl:    Vitamin D-Vitamin K (VITAMIN K2-VITAMIN D3 PO), Take 1 tablet by mouth daily., Disp: , Rfl:

## 2022-08-14 ENCOUNTER — Ambulatory Visit (INDEPENDENT_AMBULATORY_CARE_PROVIDER_SITE_OTHER): Payer: Medicare Other | Admitting: Pulmonary Disease

## 2022-08-14 ENCOUNTER — Encounter: Payer: Self-pay | Admitting: Pulmonary Disease

## 2022-08-14 VITALS — BP 120/64 | HR 52 | Ht 66.5 in | Wt 161.0 lb

## 2022-08-14 DIAGNOSIS — J4530 Mild persistent asthma, uncomplicated: Secondary | ICD-10-CM | POA: Diagnosis not present

## 2022-08-14 DIAGNOSIS — Z720 Tobacco use: Secondary | ICD-10-CM

## 2022-08-14 NOTE — Progress Notes (Signed)
Synopsis: Referred in November 2023 for bronchitis, self-referred  Subjective:   PATIENT ID: Nicole Barr GENDER: female DOB: [birth year], MRN: [id number]  HPI  Chief Complaint  Patient presents with   Follow-up    6wk f/u. States the Advair is causing her to cough more.    Nicole Barr is a 77 year old woman, daily smoker with alcoholic cirrhosis, coronary artery disease and GERD who returns to pulmonary clinic for reactive airways disease.    Initial OV 02/11/22 She reports being treated for bronchitis about a month ago with Augmentin and prednisone taper which has improved her symptoms.   She has history of bronchitis episodes at least once per year. She reports seasonal allergies in the fall. She has gained about 30lbs and is hoping to increase her physical activity with running to help lose weight. She denies heart burn or reflux.   She is followed by GI for her cirrhosis and is on spironolactone and lasix.   She denies night time respiratory symptoms. She has trouble sleeping due to mental activity.    She is smoking 5 cigarettes per day. She has been smoking for 15 years. Retired from [employer]. 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 No pets at home.   OV 08/14/22 She was seen 07/02/22 by Rhunette Croft, NP for shortness of breath. Her PFTs showed evidence of air trapping. She started on advair HFA 45-85mcg at last visit and has only been using it 1 puff daily. She has noted some cough and mucous production after taking it.   She continues to smoke.    Past Medical History:  Diagnosis Date   Alcoholic cirrhosis (HCC)    Ascites    Coronary artery disease    Endometriosis    GERD (gastroesophageal reflux disease)    Hypertension    Liver cyst      Family History  Problem Relation Age of Onset   Diabetes Mother    Cancer Mother    Hypertension Mother     Hyperlipidemia Mother    Lupus Mother    Vitiligo Mother    Heart failure Father    Heart failure Maternal Grandmother      Social History   Socioeconomic History   Marital status: Single    Spouse name: Not on file   Number of children: Not on file   Years of education: Not on file   Highest education level: Not on file  Occupational History   Not on file  Tobacco Use   Smoking status: Some Days    Packs/day: 0.50    Years: 35.00    Additional pack years: 0.00    Total pack years: 17.50    Types: Cigarettes    Start date: 04/17/1968   Smokeless tobacco: Never  Vaping Use   Vaping Use: Never used  Substance and Sexual Activity   Alcohol use: No    Alcohol/week: 10.0 standard drinks of alcohol    Types: 10 Glasses of wine per week    Comment: currently states she is not drinking but states she usually had 2 glasses of wine per evening   Drug use: No   Sexual activity: Not on file  Other Topics Concern   Not on file  Social History Narrative   Not on file   Social Determinants of Health   Financial Resource Strain: Not on file  Food Insecurity: Not  on file  Transportation Needs: Not on file  Physical Activity: Not on file  Stress: Not on file  Social Connections: Not on file  Intimate Partner Violence: Not on file     Allergies  Allergen Reactions   Carvedilol Other (See Comments)    Hypertension (intolerance), Rapid Heart Rate Increases BP to 300 - per pt    Doxepin Hcl     Other reaction(s): does not do anything for her   Trazodone Hcl     Other reaction(s): in effective for sleep (2016)     Outpatient Medications Prior to Visit  Medication Sig Dispense Refill   albuterol (VENTOLIN HFA) 108 (90 Base) MCG/ACT inhaler Inhale 1-2 puffs into the lungs every 6 (six) hours as needed for wheezing or shortness of breath.     Alirocumab (PRALUENT) 75 MG/ML SOAJ INJECT 75MG  INTO THE SKIN EVERY 14 DAYS 6 mL 0   b complex vitamins capsule Take 1 capsule by mouth  daily.     Cholecalciferol 25 MCG (1000 UT) tablet Take 1,000 Units by mouth See admin instructions. Every other day     fluticasone-salmeterol (ADVAIR HFA) 45-21 MCG/ACT inhaler Inhale 2 puffs into the lungs 2 (two) times daily. (Patient taking differently: Inhale 2 puffs into the lungs as needed.) 1 each 12   furosemide (LASIX) 20 MG tablet Take 20 mg by mouth daily.     gabapentin (NEURONTIN) 100 MG capsule Take 2 capsules (200 mg total) by mouth at bedtime. 180 capsule 3   KRILL OIL PO Take 1 tablet by mouth daily.     lactulose (CHRONULAC) 10 GM/15ML solution 3-4 tablespoons daily by mouth     nadolol (CORGARD) 20 MG tablet Take 10 mg by mouth daily.     rifaximin (XIFAXAN) 550 MG TABS tablet Take 550 mg by mouth 2 (two) times daily.     spironolactone (ALDACTONE) 50 MG tablet Take 50 mg by mouth every morning.     Vitamin D-Vitamin K (VITAMIN K2-VITAMIN D3 PO) Take 1 tablet by mouth daily.     albuterol (VENTOLIN HFA) 108 (90 Base) MCG/ACT inhaler Inhale 2 puffs into the lungs every 4 (four) hours as needed for wheezing or shortness of breath. (Patient not taking: Reported on 07/25/2022)     aspirin 81 MG EC tablet Take 81 mg by mouth every other day. PRN     Coenzyme Q10-Vitamin E (QUNOL ULTRA COQ10) 100-150 MG-UNIT CAPS Take 1 tablet by mouth in the morning. (Patient not taking: Reported on 07/25/2022)     No facility-administered medications prior to visit.    Review of Systems  Constitutional:  Negative for chills, fever, malaise/fatigue and weight loss.  HENT:  Negative for congestion, sinus pain and sore throat.   Eyes: Negative.   Respiratory:  Positive for cough, sputum production and shortness of breath. Negative for hemoptysis and wheezing.   Cardiovascular:  Negative for chest pain, palpitations, orthopnea, claudication and leg swelling.  Gastrointestinal:  Negative for abdominal pain, heartburn, nausea and vomiting.  Genitourinary: Negative.   Musculoskeletal:  Negative for  joint pain and myalgias.  Skin:  Negative for rash.  Neurological:  Negative for weakness.  Endo/Heme/Allergies: Negative.   Psychiatric/Behavioral: Negative.     Objective:   Vitals:   08/14/22 1052  BP: 120/64  Pulse: (!) 52  SpO2: 99%  Weight: 161 lb (73 kg)  Height: 5' 6.5" (1.689 m)   Physical Exam Constitutional:      General: She is not in acute distress.  Appearance: She is not ill-appearing.  HENT:     Head: Normocephalic and atraumatic.  Eyes:     General: No scleral icterus. Cardiovascular:     Rate and Rhythm: Normal rate and regular rhythm.     Pulses: Normal pulses.     Heart sounds: Normal heart sounds. No murmur heard. Pulmonary:     Effort: Pulmonary effort is normal.     Breath sounds: Normal breath sounds. No wheezing, rhonchi or rales.  Musculoskeletal:     Right lower leg: No edema.     Left lower leg: No edema.  Skin:    General: Skin is warm and dry.  Neurological:     General: No focal deficit present.     Mental Status: She is alert.    CBC    Component Value Date/Time   WBC 4.6 07/23/2021 0845   RBC 4.38 07/23/2021 0845   HGB 13.2 07/23/2021 0845   HGB 13.0 05/13/2019 0923   HCT 38.7 07/23/2021 0845   HCT 37.8 05/13/2019 0923   PLT 218 07/23/2021 0845   PLT 227 05/13/2019 0923   MCV 88.4 07/23/2021 0845   MCV 90 05/13/2019 0923   MCH 30.1 07/23/2021 0845   MCHC 34.1 07/23/2021 0845   RDW 13.4 07/23/2021 0845   RDW 12.1 05/13/2019 0923   LYMPHSABS 1.4 07/23/2021 0845   LYMPHSABS 1.4 04/21/2018 0734   MONOABS 0.5 07/23/2021 0845   EOSABS 0.2 07/23/2021 0845   EOSABS 0.2 04/21/2018 0734   BASOSABS 0.0 07/23/2021 0845   BASOSABS 0.0 04/21/2018 0734      Latest Ref Rng & Units 07/02/2022    3:18 PM 07/31/2021   11:55 AM 07/24/2020    8:43 AM  BMP  Glucose 70 - 99 mg/dL 161  096  045   BUN 6 - 23 mg/dL 17  12  17    Creatinine 0.40 - 1.20 mg/dL 4.09  8.11  9.14   Sodium 135 - 145 mEq/L 139  139  139   Potassium 3.5 - 5.1  mEq/L 3.9  3.6  3.6   Chloride 96 - 112 mEq/L 105  105  101   CO2 19 - 32 mEq/L 25  25  30    Calcium 8.4 - 10.5 mg/dL 9.7  9.7  78.2     Chest imaging: CT 07/07/22 1. Atheromatous changes. 2. Stable right lower lobe lung nodule. 3. No acute cardiopulmonary process. 4. Cirrhotic liver with a stable 2 cm cyst. 5. Cholelithiasis. 6. Stable T11 compression fracture and degenerative changes.  CT Chest 11/2016 Cardiovascular: Normal heart size. No pericardial effusion. Atherosclerosis of the aorta and coronaries. Status post coronary calcium scoring in 2016. No acute finding in the great vessels.   Mediastinum/Nodes: Calcified mediastinal lymph nodes. No enlarged lymph nodes.   Lungs/Pleura: The 6 mm right lower lobe pulmonary nodule seen on 3:111 is size stable compared to 2016. Size is mildly overestimated due to closely neighboring vessel. No new pulmonary nodule is seen. There is no edema, consolidation, effusion, or pneumothorax. PFT:    Latest Ref Rng & Units 07/02/2022    8:55 AM  PFT Results  FVC-Pre L 3.16   FVC-Predicted Pre % 101   FVC-Post L 3.07   FVC-Predicted Post % 98   Pre FEV1/FVC % % 78   Post FEV1/FCV % % 77   FEV1-Pre L 2.47   FEV1-Predicted Pre % 104   FEV1-Post L 2.36   DLCO uncorrected ml/min/mmHg 16.72   DLCO UNC% %  80   DLCO corrected ml/min/mmHg 16.72   DLCO COR %Predicted % 80   DLVA Predicted % 77   TLC L 6.38   TLC % Predicted % 117   RV % Predicted % 131     Labs:  Path:  Echo 2022: LV EF 70-75%. Hyperdynamic function. RV systolic function and size is normal. LA and RA are mildly dilated.   Heart Catheterization:  Assessment & Plan:   Mild persistent reactive airway disease without complication  Tobacco use  Discussion: Benjie Worthan is a 77 year old woman, daily smoker with alcoholic cirrhosis, coronary artery disease and GERD who returns to pulmonary clinic for reactive airways disease  She is to use advair inhaler 2  puffs twice daily and as needed albuterol inhaler. Her PFTs are within normal limits with some evidence of air trapping. Her CT Chest scan show stable lung nodules, no other pulmonary findings.   Follow up in 1 year.   Melody Comas, MD [facility] Office: [phone]   Current Outpatient Medications:    albuterol (VENTOLIN HFA) 108 (90 Base) MCG/ACT inhaler, Inhale 1-2 puffs into the lungs every 6 (six) hours as needed for wheezing or shortness of breath., Disp: , Rfl:    Alirocumab (PRALUENT) 75 MG/ML SOAJ, INJECT 75MG  INTO THE SKIN EVERY 14 DAYS, Disp: 6 mL, Rfl: 0   b complex vitamins capsule, Take 1 capsule by mouth daily., Disp: , Rfl:    Cholecalciferol 25 MCG (1000 UT) tablet, Take 1,000 Units by mouth See admin instructions. Every other day, Disp: , Rfl:    fluticasone-salmeterol (ADVAIR HFA) 45-21 MCG/ACT inhaler, Inhale 2 puffs into the lungs 2 (two) times daily. (Patient taking differently: Inhale 2 puffs into the lungs as needed.), Disp: 1 each, Rfl: 12   furosemide (LASIX) 20 MG tablet, Take 20 mg by mouth daily., Disp: , Rfl:    gabapentin (NEURONTIN) 100 MG capsule, Take 2 capsules (200 mg total) by mouth at bedtime., Disp: 180 capsule, Rfl: 3   KRILL OIL PO, Take 1 tablet by mouth daily., Disp: , Rfl:    lactulose (CHRONULAC) 10 GM/15ML solution, 3-4 tablespoons daily by mouth, Disp: , Rfl:    nadolol (CORGARD) 20 MG tablet, Take 10 mg by mouth daily., Disp: , Rfl:    rifaximin (XIFAXAN) 550 MG TABS tablet, Take 550 mg by mouth 2 (two) times daily., Disp: , Rfl:    spironolactone (ALDACTONE) 50 MG tablet, Take 50 mg by mouth every morning., Disp: , Rfl:

## 2022-08-14 NOTE — Patient Instructions (Addendum)
Use advair HFA inhaler 2 puffs twice daily - rinse mouth out after each use  Use albuterol inhaler inhaler 1-2 puffs every 4-6 hours as needed  Your CT Chest scan shows stable size of the lung nodules, not other concerning lung findings at this time.  Follow up in 1 year, call sooner if needed

## 2022-08-15 ENCOUNTER — Telehealth: Payer: Self-pay | Admitting: Pulmonary Disease

## 2022-08-15 NOTE — Telephone Encounter (Signed)
Called patient but she did not answer. Left message for patient to call back.  

## 2022-08-15 NOTE — Telephone Encounter (Signed)
Pt presented to the front desk saying the Advair Dr. Francine Graven gave her yesterday is making her foggy headed and seems to be tightening her airway as opposed to opening it. She took two does and I told her to stop until she hears from our Triage team. Her # is [phone]

## 2022-08-18 ENCOUNTER — Ambulatory Visit: Payer: Medicare Other | Admitting: Internal Medicine

## 2022-08-18 DIAGNOSIS — R002 Palpitations: Secondary | ICD-10-CM | POA: Diagnosis not present

## 2022-08-20 ENCOUNTER — Other Ambulatory Visit: Payer: Self-pay | Admitting: Family Medicine

## 2022-08-20 DIAGNOSIS — Z0000 Encounter for general adult medical examination without abnormal findings: Secondary | ICD-10-CM

## 2022-08-20 NOTE — Telephone Encounter (Signed)
Agree with stopping advair inhaler if she is having reactions.  Can use albuterol inhaler as needed instead.  Thanks, JD

## 2022-08-20 NOTE — Telephone Encounter (Signed)
Called and spoke with patient. She verbalized understanding. I have added Advair to her allergy list.   Nothing further needed at time of call.

## 2022-08-20 NOTE — Telephone Encounter (Signed)
Called and spoke with patient. She stated that she started and stopped the Advair on 05/10. She felt like her airway tightened up and she could not breathe well for hours. She went ahead and took the evening dose and noticed the same reaction. She also noticed that she became more forgetful.   She denied any hives, tongue swelling or coughing. She confirmed that she has not used the inhaler since 05/10 and the symptoms have not returned since she stopped the inhaler.   She wanted to let Dr. Francine Graven know to see if he had any recommendations for her.   Dr. Francine Graven, can you please advise? Thanks!

## 2022-08-20 NOTE — Telephone Encounter (Signed)
Please try again and close encounter if not avail. TY.

## 2022-08-26 ENCOUNTER — Telehealth: Payer: Self-pay

## 2022-08-26 MED ORDER — NADOLOL 20 MG PO TABS: 20.0000 mg | ORAL_TABLET | Freq: Every day | ORAL | 3 refills | Status: AC

## 2022-08-26 NOTE — Telephone Encounter (Signed)
The patient has been notified of the result and verbalized understanding.  All questions (if any) were answered. Nicole Burows, RN 08/26/2022 11:30 AM   Pt request script to be sent to [facility].  Sent per pt request.

## 2022-08-26 NOTE — Telephone Encounter (Signed)
-----   Message from Christell Constant, MD sent at 08/26/2022 10:05 AM EDT ----- She take 10 of her 20 mg nadolol dose; we would increase to 20 mg  ----- Message ----- From: Macie Burows, RN Sent: 08/26/2022   9:23 AM EDT To: Christell Constant, MD  What would dose increase to?  ----- Message ----- From: Christell Constant, MD Sent: 08/24/2022   4:06 PM EDT To: Irven Coe, MD; Macie Burows, RN  Results: Asymptomatic SVT Plan: Offer increase in BB  Christell Constant, MD

## 2022-08-28 DIAGNOSIS — Z6826 Body mass index (BMI) 26.0-26.9, adult: Secondary | ICD-10-CM | POA: Diagnosis not present

## 2022-08-28 DIAGNOSIS — N1831 Chronic kidney disease, stage 3a: Secondary | ICD-10-CM | POA: Diagnosis not present

## 2022-08-28 DIAGNOSIS — E7800 Pure hypercholesterolemia, unspecified: Secondary | ICD-10-CM | POA: Diagnosis not present

## 2022-08-28 DIAGNOSIS — E663 Overweight: Secondary | ICD-10-CM | POA: Diagnosis not present

## 2022-08-28 DIAGNOSIS — Z131 Encounter for screening for diabetes mellitus: Secondary | ICD-10-CM | POA: Diagnosis not present

## 2022-08-28 DIAGNOSIS — Z720 Tobacco use: Secondary | ICD-10-CM | POA: Diagnosis not present

## 2022-08-28 DIAGNOSIS — R7303 Prediabetes: Secondary | ICD-10-CM | POA: Diagnosis not present

## 2022-08-28 DIAGNOSIS — F419 Anxiety disorder, unspecified: Secondary | ICD-10-CM | POA: Diagnosis not present

## 2022-08-28 DIAGNOSIS — R5383 Other fatigue: Secondary | ICD-10-CM | POA: Diagnosis not present

## 2022-08-28 DIAGNOSIS — K7030 Alcoholic cirrhosis of liver without ascites: Secondary | ICD-10-CM | POA: Diagnosis not present

## 2022-08-28 DIAGNOSIS — E8889 Other specified metabolic disorders: Secondary | ICD-10-CM | POA: Diagnosis not present

## 2022-08-28 DIAGNOSIS — F32A Depression, unspecified: Secondary | ICD-10-CM | POA: Diagnosis not present

## 2022-09-08 ENCOUNTER — Telehealth: Payer: Self-pay | Admitting: Pharmacist

## 2022-09-08 DIAGNOSIS — I2510 Atherosclerotic heart disease of native coronary artery without angina pectoris: Secondary | ICD-10-CM

## 2022-09-08 DIAGNOSIS — E7800 Pure hypercholesterolemia, unspecified: Secondary | ICD-10-CM

## 2022-09-08 NOTE — Telephone Encounter (Signed)
Pt left message reporting side effects on her Praluent.  Previously took atorvastatin which she reported increased her LFTs. Has a history of alcoholic cirrhosis. Previously reached out to her gasteroenterologist Dr Dulce Sellar who stated he was comfortable with pt taking any hyperlipidemia medication since her cirrhosis was stable. She was started on Praluent in 2021 due to elevated calcium score of 335 (99th percentile) in 2016.   LDL 71 and LFTs normal on labs scanned in from 02/2022.   Returned call to pt. Reports that for the past 4-5 weeks at the gym, finished 3 miles on the treadmill and her right thigh started hurting, then her left thigh started hurting too. States she's been immobile since then. Also reports excessive mucus in her nostrils. Also states her sports doctor told her a year ago she has peripheral neuropathy that she hasn't received treatment for.   Has been on Praluent for 3 years, no issues until last month. Advised pt to skip her next 1-2 Praluent doses then could rechallenge and see if symptoms return. She is aware to contact PCP if symptoms do not improve off Praluent. Can consider low dose rosuvastatin if needed.

## 2022-09-11 DIAGNOSIS — R7303 Prediabetes: Secondary | ICD-10-CM | POA: Diagnosis not present

## 2022-09-11 DIAGNOSIS — K7030 Alcoholic cirrhosis of liver without ascites: Secondary | ICD-10-CM | POA: Diagnosis not present

## 2022-09-11 DIAGNOSIS — Z6826 Body mass index (BMI) 26.0-26.9, adult: Secondary | ICD-10-CM | POA: Diagnosis not present

## 2022-09-11 DIAGNOSIS — F32A Depression, unspecified: Secondary | ICD-10-CM | POA: Diagnosis not present

## 2022-09-11 DIAGNOSIS — E663 Overweight: Secondary | ICD-10-CM | POA: Diagnosis not present

## 2022-09-11 DIAGNOSIS — Z720 Tobacco use: Secondary | ICD-10-CM | POA: Diagnosis not present

## 2022-09-11 DIAGNOSIS — F419 Anxiety disorder, unspecified: Secondary | ICD-10-CM | POA: Diagnosis not present

## 2022-09-11 DIAGNOSIS — N1831 Chronic kidney disease, stage 3a: Secondary | ICD-10-CM | POA: Diagnosis not present

## 2022-09-11 DIAGNOSIS — R5383 Other fatigue: Secondary | ICD-10-CM | POA: Diagnosis not present

## 2022-09-11 DIAGNOSIS — E7800 Pure hypercholesterolemia, unspecified: Secondary | ICD-10-CM | POA: Diagnosis not present

## 2022-09-18 DIAGNOSIS — F5104 Psychophysiologic insomnia: Secondary | ICD-10-CM | POA: Diagnosis not present

## 2022-09-18 DIAGNOSIS — G4719 Other hypersomnia: Secondary | ICD-10-CM | POA: Diagnosis not present

## 2022-09-18 DIAGNOSIS — F419 Anxiety disorder, unspecified: Secondary | ICD-10-CM | POA: Diagnosis not present

## 2022-09-22 ENCOUNTER — Ambulatory Visit
Admission: RE | Admit: 2022-09-22 | Discharge: 2022-09-22 | Disposition: A | Discharge status: Home / Self Care | Payer: Medicare Other | Source: Ambulatory Visit | Attending: Family Medicine | Admitting: Family Medicine

## 2022-09-22 ENCOUNTER — Ambulatory Visit: Payer: Medicare Other

## 2022-09-22 DIAGNOSIS — Z1231 Encounter for screening mammogram for malignant neoplasm of breast: Secondary | ICD-10-CM | POA: Diagnosis not present

## 2022-09-22 DIAGNOSIS — Z0000 Encounter for general adult medical examination without abnormal findings: Secondary | ICD-10-CM

## 2022-09-23 NOTE — Progress Notes (Unsigned)
[name] [address] [place] [address] Phone: [phone] Subjective:   Bruce Donath, am serving as a scribe for Dr. Antoine Primas.  I'm seeing this patient by the request  of:  Irven Coe, MD  CC: right hip pain   [id number]  07/17/2021 Patient wants to hold on any type of injection at this point.  We have discussed previously about the possibility of a lumbar radiculopathy.  Still continuing on the gabapentin.  Patient is more concerned with more of the upcoming surgery the patient is having.  Encourage patient to focus on that at the moment and will follow-up with me again in 2 months afterwards.  Time discussing with patient more about her upcoming procedure and her anxiety other medical problems greater than 33 minutes.      Update 09/24/2022 SHEKERIA OSUNA is a 77 y.o. female coming in with complaint of R hip pain. Patient states that her hip pain is getting worse after using the treadmill. Had hamstring spasm recently after walking on treadmill. The next day she had a L hamstring spasm. Wonders if her cholesterol medication caused the spasming as she read it is a side effect of her medication. Patient called pharmacy and they recommended skipping 2 doses to see if she gets spasms when she goes back on medication. Has not gone back on medication as it is every 14 days she has injection. Next injection will be on July 2nd. Patient wants to now if she needs refill of gabapentin. Expiration of 03/25/2022. Only uses medication when she has pain.        Past Medical History:  Diagnosis Date   Alcoholic cirrhosis (HCC)    Ascites    Coronary artery disease    Endometriosis    GERD (gastroesophageal reflux disease)    Hypertension    Liver cyst    Past Surgical History:  Procedure Laterality Date   CAROTID ENDARTERECTOMY Right 04/07/2006   egd with banding  04/07/2014   with bleeding after wards done at [facility]    ESOPHAGOGASTRODUODENOSCOPY (EGD) WITH PROPOFOL N/A 05/02/2015   Procedure: ESOPHAGOGASTRODUODENOSCOPY (EGD) WITH PROPOFOL;  Surgeon: Willis Modena, MD;  Location: [facility];  Service: Endoscopy;  Laterality: N/A;   ESOPHAGOGASTRODUODENOSCOPY (EGD) WITH PROPOFOL N/A 06/06/2015   Procedure: ESOPHAGOGASTRODUODENOSCOPY (EGD) WITH PROPOFOL;  Surgeon: Willis Modena, MD;  Location: [facility];  Service: Endoscopy;  Laterality: N/A;   GASTRIC VARICES BANDING N/A 05/02/2015   Procedure: GASTRIC VARICES BANDING;  Surgeon: Willis Modena, MD;  Location: [facility];  Service: Endoscopy;  Laterality: N/A;   HEMORROIDECTOMY  2008   banding   LAMINECTOMY  04/08/1987   lumbar 4-5   LAPAROSCOPY  04/08/1987   adhesions   oophrectomy Left 04/08/1987   TONSILLECTOMY     age 67   TRANSANAL EXCISION OF RECTAL MASS N/A 07/31/2021   Procedure: TRANSANAL EXCISION OF DISTAL RECTAL POLYPOID MASS;  Surgeon: Andria Meuse, MD;  Location: [facility];  Service: General;  Laterality: N/A;   UMBILICAL HERNIA REPAIR  2020   Social History   Socioeconomic History   Marital status: Single    Spouse name: Not on file   Number of children: Not on file   Years of education: Not on file   Highest education level: Not on file  Occupational History   Not on file  Tobacco Use   Smoking status: Some Days    Packs/day: 0.50    Years: 35.00    Additional  pack years: 0.00    Total pack years: 17.50    Types: Cigarettes    Start date: 04/17/1968   Smokeless tobacco: Never  Vaping Use   Vaping Use: Never used  Substance and Sexual Activity   Alcohol use: No    Alcohol/week: 10.0 standard drinks of alcohol    Types: 10 Glasses of wine per week    Comment: currently states she is not drinking but states she usually had 2 glasses of wine per evening   Drug use: No   Sexual activity: Not on file  Other Topics Concern   Not on file  Social History Narrative   Not on file   Social Determinants of Health    Financial Resource Strain: Not on file  Food Insecurity: Not on file  Transportation Needs: Not on file  Physical Activity: Not on file  Stress: Not on file  Social Connections: Not on file   Allergies  Allergen Reactions   Advair Hfa [Fluticasone-Salmeterol] Anaphylaxis   Carvedilol Other (See Comments)    Hypertension (intolerance), Rapid Heart Rate Increases BP to 300 - per pt    Doxepin Hcl     Other reaction(s): does not do anything for her   Trazodone Hcl     Other reaction(s): in effective for sleep (2016)   Family History  Problem Relation Age of Onset   Diabetes Mother    Cancer Mother    Hypertension Mother    Hyperlipidemia Mother    Lupus Mother    Vitiligo Mother    Heart failure Father    Heart failure Maternal Grandmother      Current Outpatient Medications (Cardiovascular):    nadolol (CORGARD) 20 MG tablet, Take 1 tablet (20 mg total) by mouth daily.   Alirocumab (PRALUENT) 75 MG/ML SOAJ, INJECT 75MG  INTO THE SKIN EVERY 14 DAYS   furosemide (LASIX) 20 MG tablet, Take 20 mg by mouth daily.   spironolactone (ALDACTONE) 50 MG tablet, Take 50 mg by mouth every morning.  Current Outpatient Medications (Respiratory):    albuterol (VENTOLIN HFA) 108 (90 Base) MCG/ACT inhaler, Inhale 1-2 puffs into the lungs every 6 (six) hours as needed for wheezing or shortness of breath.    Current Outpatient Medications (Other):    b complex vitamins capsule, Take 1 capsule by mouth daily.   Cholecalciferol 25 MCG (1000 UT) tablet, Take 1,000 Units by mouth See admin instructions. Every other day   gabapentin (NEURONTIN) 100 MG capsule, Take 2 capsules (200 mg total) by mouth at bedtime.   KRILL OIL PO, Take 1 tablet by mouth daily.   lactulose (CHRONULAC) 10 GM/15ML solution, 3-4 tablespoons daily by mouth   rifaximin (XIFAXAN) 550 MG TABS tablet, Take 550 mg by mouth 2 (two) times daily.   Reviewed prior external information including notes and imaging from   primary care provider As well as notes that were available from care everywhere and other healthcare systems.  Past medical history, social, surgical and family history all reviewed in electronic medical record.  No pertanent information unless stated regarding to the chief complaint.   Review of Systems:  No headache, visual changes, nausea, vomiting, diarrhea, constipation, dizziness, abdominal pain, skin rash, fevers, chills, night sweats, weight loss, swollen lymph nodes, body aches, joint swelling, chest pain, shortness of breath, mood changes. POSITIVE muscle aches  Objective  There were no vitals taken for this visit.   General: No apparent distress alert and oriented x3 mood and affect normal, dressed appropriately.  HEENT: Pupils equal, extraocular movements intact  Respiratory: Patient's speak in full sentences and does not appear short of breath  Cardiovascular: No lower extremity edema, non tender, no erythema  Patient does have an antalgic gait noted.  Severe tenderness to palpation over the greater trochanteric area.  Positive FABER test noted.  Negative straight leg test noted. Does have tenderness to palpation though in the paraspinal musculature of the lumbar spine   Procedure: Real-time Ultrasound Guided Injection of right greater trochanteric bursitis secondary to patient's body habitus Device: GE Logiq Q7 Ultrasound guided injection is preferred based studies that show increased duration, increased effect, greater accuracy, decreased procedural pain, increased response rate, and decreased cost with ultrasound guided versus blind injection.  Verbal informed consent obtained.  Time-out conducted.  Noted no overlying erythema, induration, or other signs of local infection.  Skin prepped in a sterile fashion.  Local anesthesia: Topical Ethyl chloride.  With sterile technique and under real time ultrasound guidance:  Greater trochanteric area was visualized and patient's  bursa was noted. A 22-gauge 3 inch needle was inserted and 4 cc of 0.5% Marcaine and 1 cc of Kenalog 40 mg/dL was injected. Pictures taken Completed without difficulty  Pain immediately resolved suggesting accurate placement of the medication.  Advised to call if fevers/chills, erythema, induration, drainage, or persistent bleeding.   Impression: Technically successful ultrasound guided injection.    Impression and Recommendations:     The above documentation has been reviewed and is accurate and complete Judi Saa, DO

## 2022-09-24 ENCOUNTER — Ambulatory Visit (INDEPENDENT_AMBULATORY_CARE_PROVIDER_SITE_OTHER): Payer: Medicare Other | Admitting: Family Medicine

## 2022-09-24 ENCOUNTER — Encounter: Payer: Self-pay | Admitting: Family Medicine

## 2022-09-24 VITALS — BP 122/72 | HR 72 | Ht 66.5 in | Wt 164.0 lb

## 2022-09-24 DIAGNOSIS — M7061 Trochanteric bursitis, right hip: Secondary | ICD-10-CM | POA: Diagnosis not present

## 2022-09-24 MED ORDER — GABAPENTIN 100 MG PO CAPS: 100.0000 mg | ORAL_CAPSULE | Freq: Every day | ORAL | 0 refills | Status: AC

## 2022-09-24 NOTE — Assessment & Plan Note (Signed)
Repeat injection today and tolerated the procedure well, discussed icing regimen and home exercises.  Discussed hip abductor strengthening.  Differential includes lumbar radiculopathy.  May need to consider another injection in the back if necessary.  Discussed using the gabapentin on a more regular basis.  Follow-up with me again in 6 to 8 weeks

## 2022-09-24 NOTE — Patient Instructions (Signed)
Injection in R GT Gabapentin at night 100mg  See me in 3 months

## 2022-09-25 DIAGNOSIS — F419 Anxiety disorder, unspecified: Secondary | ICD-10-CM | POA: Diagnosis not present

## 2022-09-25 DIAGNOSIS — Z6826 Body mass index (BMI) 26.0-26.9, adult: Secondary | ICD-10-CM | POA: Diagnosis not present

## 2022-09-25 DIAGNOSIS — N1831 Chronic kidney disease, stage 3a: Secondary | ICD-10-CM | POA: Diagnosis not present

## 2022-09-25 DIAGNOSIS — E7800 Pure hypercholesterolemia, unspecified: Secondary | ICD-10-CM | POA: Diagnosis not present

## 2022-09-25 DIAGNOSIS — E663 Overweight: Secondary | ICD-10-CM | POA: Diagnosis not present

## 2022-09-25 DIAGNOSIS — R7303 Prediabetes: Secondary | ICD-10-CM | POA: Diagnosis not present

## 2022-09-25 DIAGNOSIS — F32A Depression, unspecified: Secondary | ICD-10-CM | POA: Diagnosis not present

## 2022-09-25 DIAGNOSIS — K7030 Alcoholic cirrhosis of liver without ascites: Secondary | ICD-10-CM | POA: Diagnosis not present

## 2022-09-25 DIAGNOSIS — Z720 Tobacco use: Secondary | ICD-10-CM | POA: Diagnosis not present

## 2022-09-25 DIAGNOSIS — R5383 Other fatigue: Secondary | ICD-10-CM | POA: Diagnosis not present

## 2022-10-07 DIAGNOSIS — G4710 Hypersomnia, unspecified: Secondary | ICD-10-CM | POA: Diagnosis not present

## 2022-10-07 DIAGNOSIS — G4719 Other hypersomnia: Secondary | ICD-10-CM | POA: Diagnosis not present

## 2022-10-07 DIAGNOSIS — F419 Anxiety disorder, unspecified: Secondary | ICD-10-CM | POA: Diagnosis not present

## 2022-10-07 DIAGNOSIS — F5104 Psychophysiologic insomnia: Secondary | ICD-10-CM | POA: Diagnosis not present

## 2022-10-13 MED ORDER — PRALUENT 75 MG/ML ~~LOC~~ SOAJ: 75.0000 mg | SUBCUTANEOUS | 3 refills | Status: DC

## 2022-10-13 NOTE — Telephone Encounter (Signed)
Patient called to report that she held 2 doses of praluent and then gave herself another injection. Her hamstring pain did not return. She plans to continue Prauent. Rx sent to pharmacy.

## 2022-10-13 NOTE — Addendum Note (Signed)
Addended by: Malena Peer D on: 10/13/2022 04:21 PM   Modules accepted: Orders

## 2022-10-14 NOTE — Telephone Encounter (Signed)
Excellent

## 2022-10-15 ENCOUNTER — Telehealth: Payer: Self-pay | Admitting: Pharmacy Technician

## 2022-10-15 ENCOUNTER — Other Ambulatory Visit: Payer: Self-pay | Admitting: Pharmacist

## 2022-10-15 ENCOUNTER — Telehealth: Payer: Self-pay | Admitting: Pharmacist

## 2022-10-15 DIAGNOSIS — E7800 Pure hypercholesterolemia, unspecified: Secondary | ICD-10-CM

## 2022-10-15 NOTE — Telephone Encounter (Signed)
Nicole Barr, Fyi note:  Patient will be scheduled as soon as possible.  Auth Submission: NO AUTH NEEDED Site of care: Site of care: [facility] Payer: medicare a/b & aarp Medication & CPT/J Code(s) submitted: Leqvio (Inclisiran) [phone] Route of submission (phone, fax, portal):  Phone # Fax # Auth type: Buy/Bill Units/visits requested: x2 doses Reference number:  Approval from: 10/15/22 to 04/07/23

## 2022-10-15 NOTE — Telephone Encounter (Signed)
Pt left message that Praluent was > $300 at the pharmacy for a 3 month supply. Looks like her [employer] expired in March. [facility] is currently closed and cannot re-enroll pt. Advised pt we can let her know when grant re-opens but don't have other avenues to lower her copay. Could pick up monthly if more affordable.  Also discussed Leqvio as an option. She has a Medicare supplement so med should be free. She is interested in trying this. Will enter referral to [facility]. She is aware they will run her benefits then call her to schedule appt.

## 2022-10-21 ENCOUNTER — Ambulatory Visit: Payer: Medicare Other

## 2022-10-21 VITALS — BP 120/63 | HR 65 | Temp 98.1°F | Resp 16 | Ht 66.0 in | Wt 168.6 lb

## 2022-10-21 DIAGNOSIS — I700 Atherosclerosis of aorta: Secondary | ICD-10-CM | POA: Diagnosis not present

## 2022-10-21 DIAGNOSIS — F5104 Psychophysiologic insomnia: Secondary | ICD-10-CM | POA: Diagnosis not present

## 2022-10-21 DIAGNOSIS — F411 Generalized anxiety disorder: Secondary | ICD-10-CM | POA: Diagnosis not present

## 2022-10-21 MED ORDER — INCLISIRAN SODIUM 284 MG/1.5ML ~~LOC~~ SOSY
284.0000 mg | PREFILLED_SYRINGE | Freq: Once | SUBCUTANEOUS | Status: AC
  Administered 2022-10-21: 284 mg via SUBCUTANEOUS
  Filled 2022-10-21: qty 1.5

## 2022-10-21 NOTE — Patient Instructions (Signed)
Inclisiran Injection What is this medication? INCLISIRAN (in kli SIR an) treats high cholesterol. It works by decreasing bad cholesterol (such as LDL) in your blood. Changes to diet and exercise are often combined with this medication. This medicine may be used for other purposes; ask your health care provider or pharmacist if you have questions. COMMON BRAND NAME(S): LEQVIO What should I tell my care team before I take this medication? They need to know if you have any of these conditions: An unusual or allergic reaction to inclisiran, other medications, foods, dyes, or preservatives Pregnant or trying to get pregnant Breast-feeding How should I use this medication? This medication is injected under the skin. It is given by your care team in a hospital or clinic setting. Talk to your care team about the use of this medication in children. Special care may be needed. Overdosage: If you think you have taken too much of this medicine contact a poison control center or emergency room at once. NOTE: This medicine is only for you. Do not share this medicine with others. What if I miss a dose? Keep appointments for follow-up doses. It is important not to miss your dose. Call your care team if you are unable to keep an appointment. What may interact with this medication? Interactions are not expected. This list may not describe all possible interactions. Give your health care provider a list of all the medicines, herbs, non-prescription drugs, or dietary supplements you use. Also tell them if you smoke, drink alcohol, or use illegal drugs. Some items may interact with your medicine. What should I watch for while using this medication? Visit your care team for regular checks on your progress. Tell your care team if your symptoms do not start to get better or if they get worse. You may need blood work while you are taking this medication. What side effects may I notice from receiving this  medication? Side effects that you should report to your care team as soon as possible: Allergic reactions--skin rash, itching, hives, swelling of the face, lips, tongue, or throat Side effects that usually do not require medical attention (report these to your care team if they continue or are bothersome): Joint pain Pain, redness, or irritation at injection site This list may not describe all possible side effects. Call your doctor for medical advice about side effects. You may report side effects to FDA at 1-800-FDA-1088. Where should I keep my medication? This medication is given in a hospital or clinic. It will not be stored at home. NOTE: This sheet is a summary. It may not cover all possible information. If you have questions about this medicine, talk to your doctor, pharmacist, or health care provider.  2024 Elsevier/Gold Standard (2022-06-01 00:00:00)  

## 2022-10-21 NOTE — Progress Notes (Signed)
Diagnosis: Hyperlipidemia  Provider:  Chilton Greathouse MD  Procedure: Injection   Leqvio (inclisiran), Dose: 284 mg, Site: subcutaneous, Number of injections: 1  Post Care: Observation period completed  Discharge: Condition: Good, Destination: Home . AVS Provided  Performed by:  Rico Ala, LPN

## 2022-10-29 DIAGNOSIS — M7061 Trochanteric bursitis, right hip: Secondary | ICD-10-CM | POA: Diagnosis not present

## 2022-11-06 DIAGNOSIS — R531 Weakness: Secondary | ICD-10-CM | POA: Diagnosis not present

## 2022-11-06 DIAGNOSIS — R109 Unspecified abdominal pain: Secondary | ICD-10-CM | POA: Diagnosis not present

## 2022-11-06 DIAGNOSIS — R5382 Chronic fatigue, unspecified: Secondary | ICD-10-CM | POA: Diagnosis not present

## 2022-11-06 DIAGNOSIS — L658 Other specified nonscarring hair loss: Secondary | ICD-10-CM | POA: Diagnosis not present

## 2022-11-11 DIAGNOSIS — Z6826 Body mass index (BMI) 26.0-26.9, adult: Secondary | ICD-10-CM | POA: Diagnosis not present

## 2022-11-11 DIAGNOSIS — R5383 Other fatigue: Secondary | ICD-10-CM | POA: Diagnosis not present

## 2022-11-11 DIAGNOSIS — E663 Overweight: Secondary | ICD-10-CM | POA: Diagnosis not present

## 2022-11-11 DIAGNOSIS — F32A Depression, unspecified: Secondary | ICD-10-CM | POA: Diagnosis not present

## 2022-11-11 DIAGNOSIS — E7800 Pure hypercholesterolemia, unspecified: Secondary | ICD-10-CM | POA: Diagnosis not present

## 2022-11-11 DIAGNOSIS — N1831 Chronic kidney disease, stage 3a: Secondary | ICD-10-CM | POA: Diagnosis not present

## 2022-11-11 DIAGNOSIS — R7303 Prediabetes: Secondary | ICD-10-CM | POA: Diagnosis not present

## 2022-11-11 DIAGNOSIS — K7030 Alcoholic cirrhosis of liver without ascites: Secondary | ICD-10-CM | POA: Diagnosis not present

## 2022-11-11 DIAGNOSIS — Z720 Tobacco use: Secondary | ICD-10-CM | POA: Diagnosis not present

## 2022-11-24 ENCOUNTER — Other Ambulatory Visit: Payer: Self-pay | Admitting: Gastroenterology

## 2022-11-24 DIAGNOSIS — K7460 Unspecified cirrhosis of liver: Secondary | ICD-10-CM

## 2022-11-25 IMAGING — DX DG HIP (WITH OR WITHOUT PELVIS) 2-3V*R*
3 series · 3 of 3 positions shown · non-contrast
Comparison: None.

CLINICAL DATA: Right hip pain. Chronic low back pain. No known
injury.

EXAM:
DG HIP (WITH OR WITHOUT PELVIS) 2-3V RIGHT

[pelvis ap]
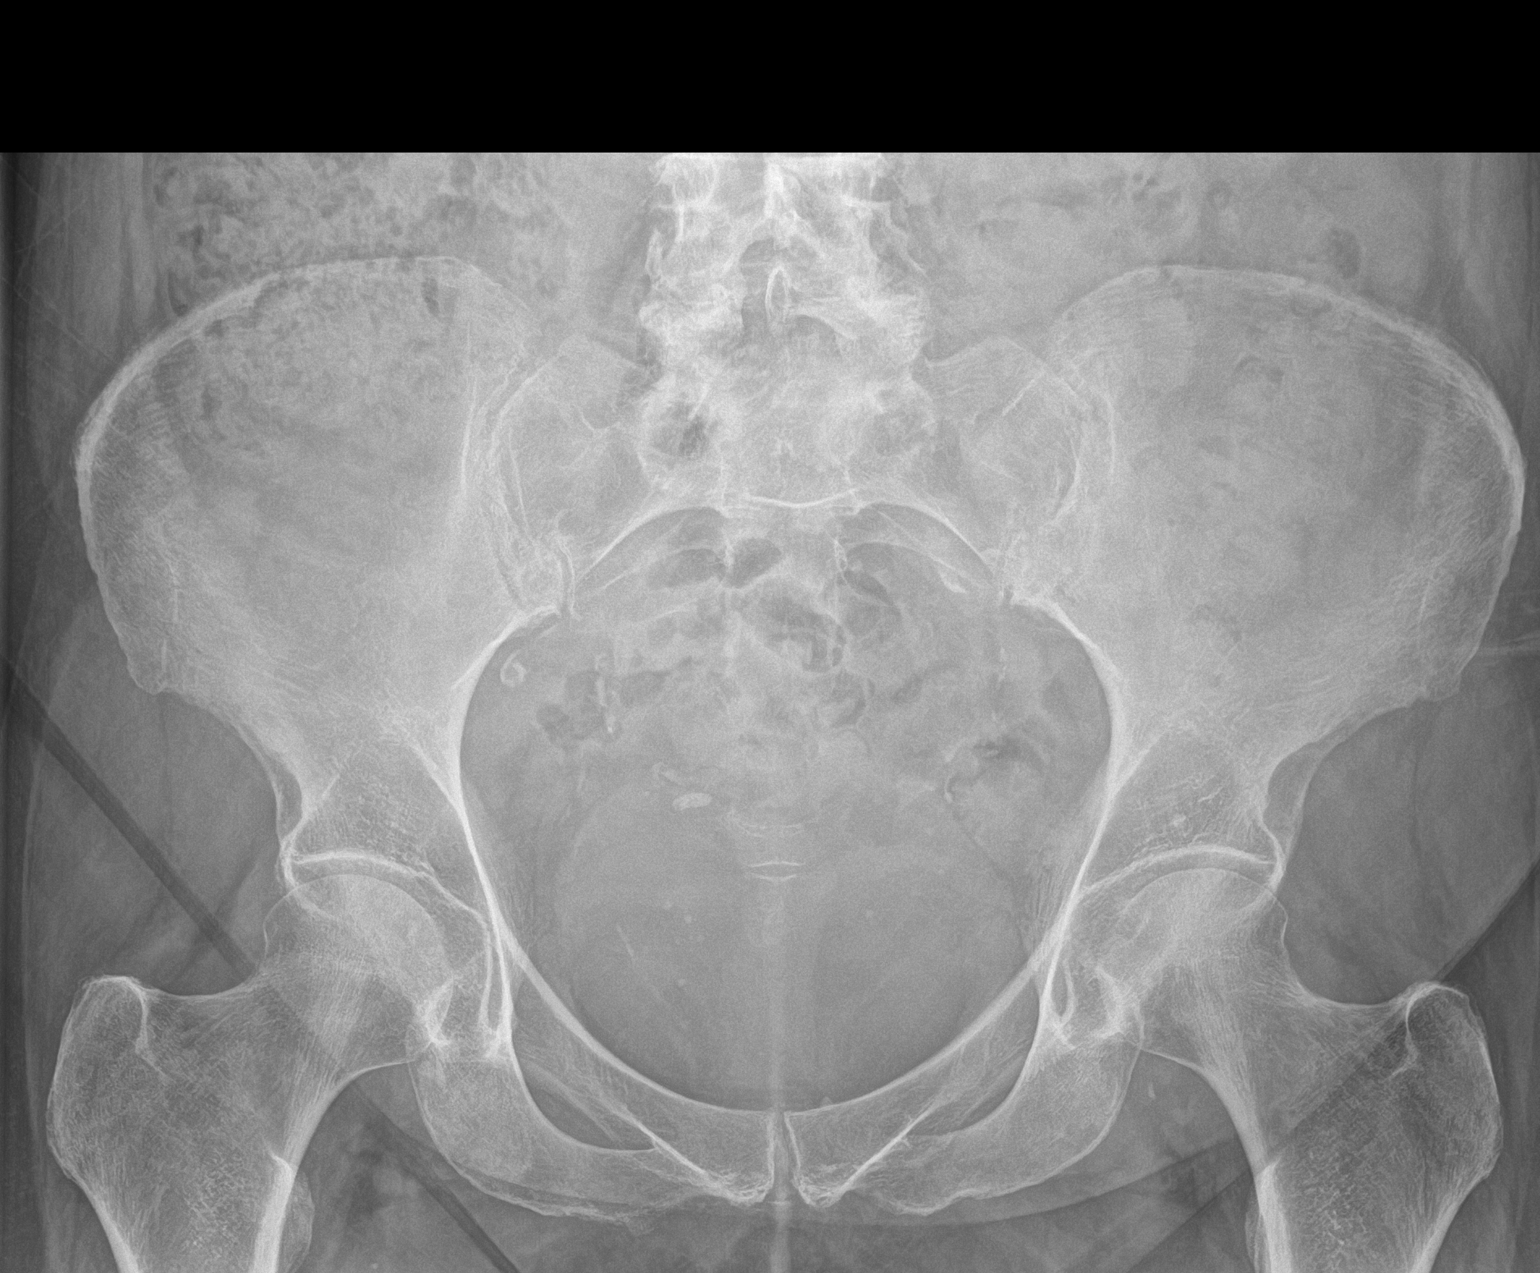

[hip ap]
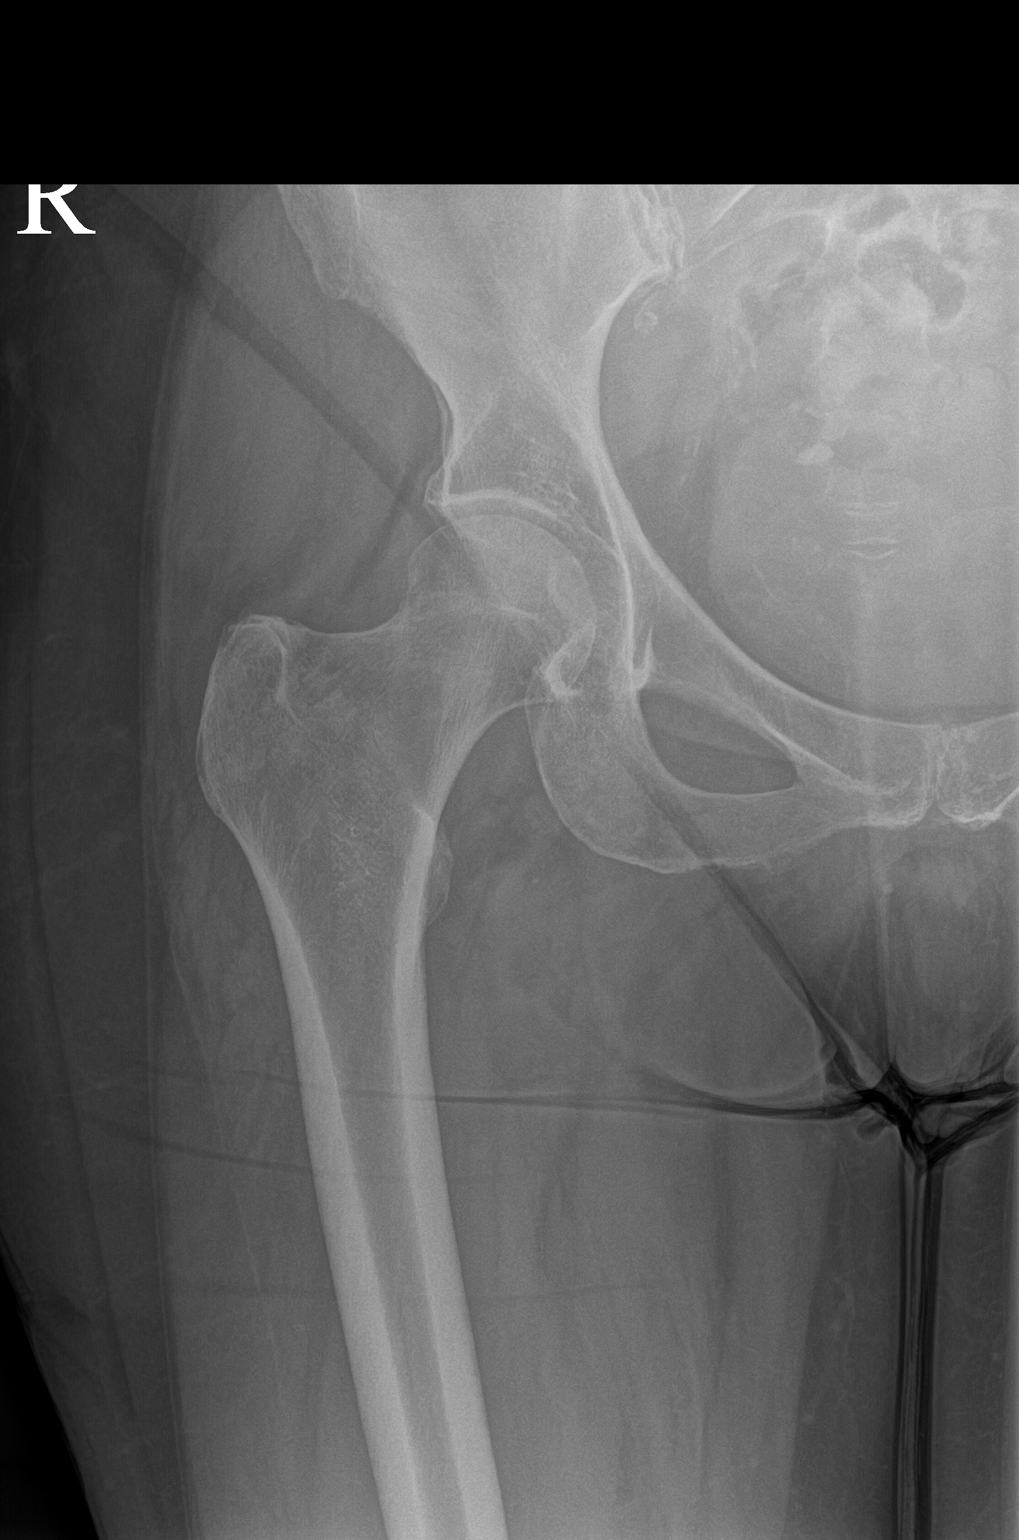

[hip frog leg]
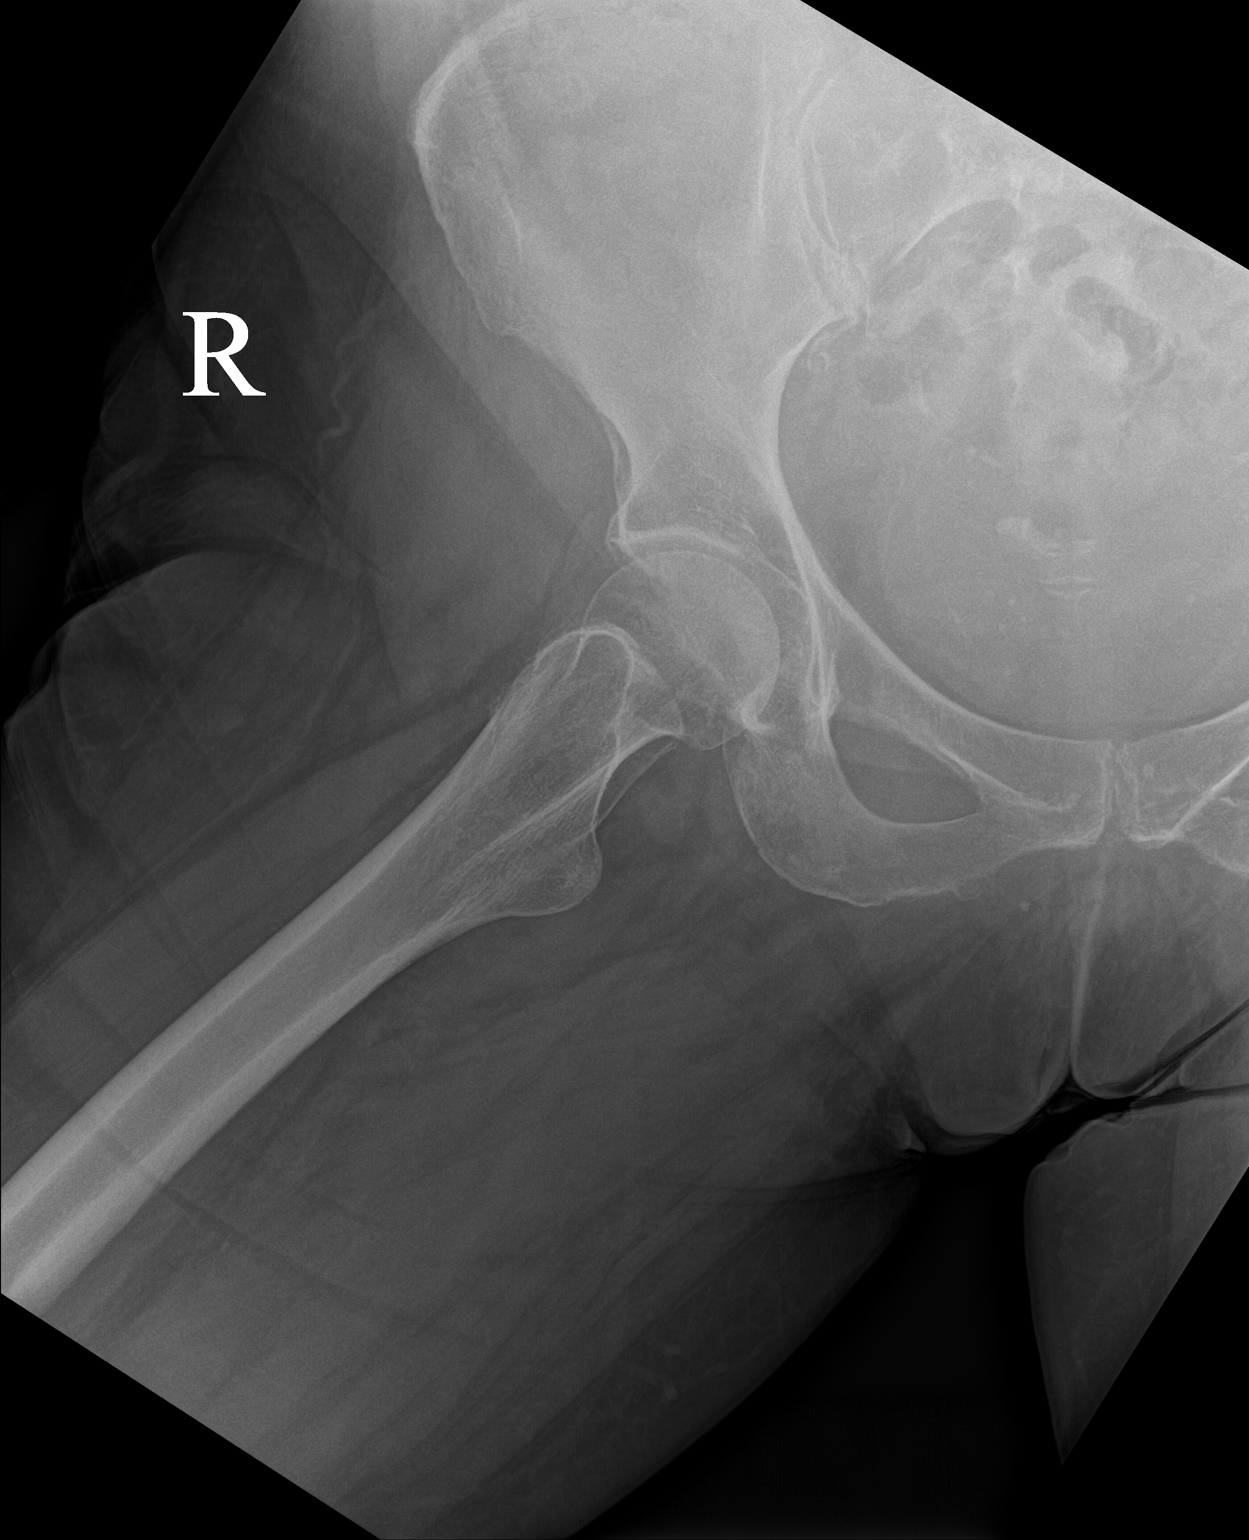

[3 of 3 positions shown; findings below may reference images not displayed]

FINDINGS: There is no evidence of hip fracture or dislocation. There is no
evidence of arthropathy or other focal bone abnormality.
IMPRESSION: Negative.

## 2022-11-25 IMAGING — DX DG LUMBAR SPINE COMPLETE 4+V
5 series · 5 of 5 positions shown · non-contrast
Comparison: None.

CLINICAL DATA: Back pain.  No known injury.

EXAM:
LUMBAR SPINE - COMPLETE 4+ VIEW

[l-spine ap]
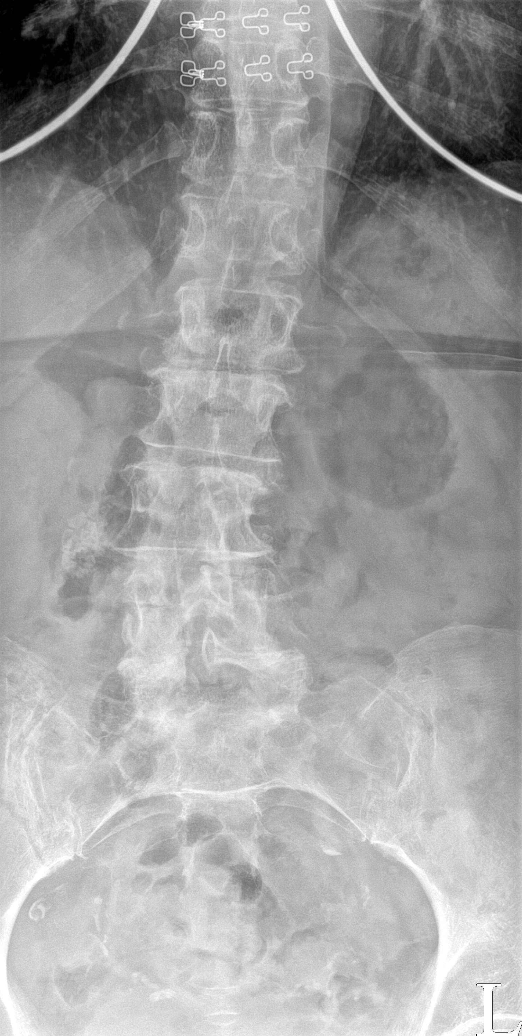

[l-spine obl (1 of 2)]
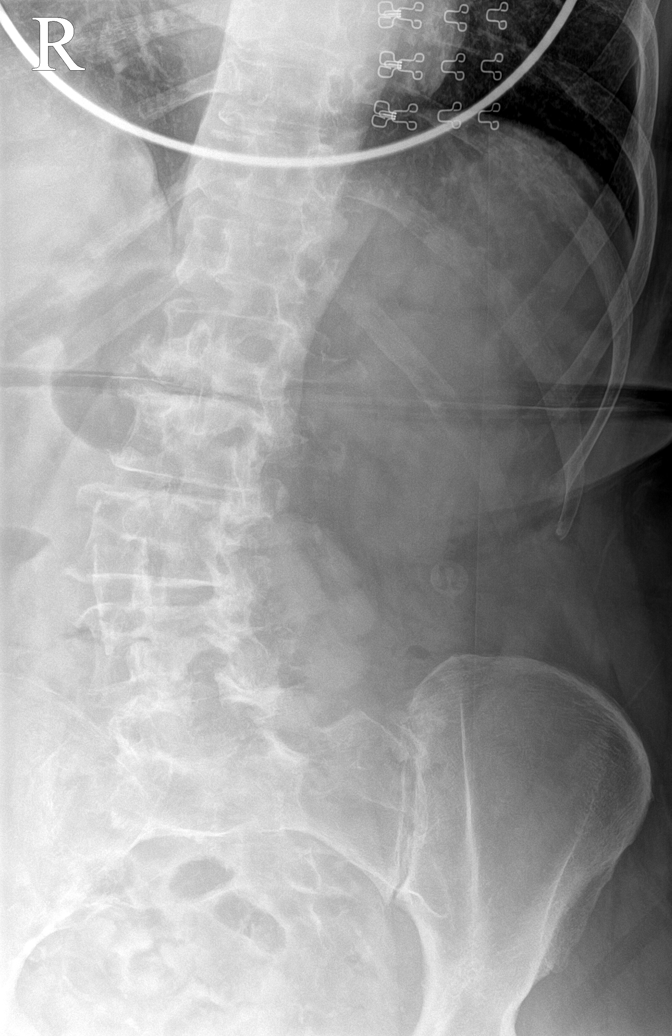

[l-spine obl (2 of 2)]
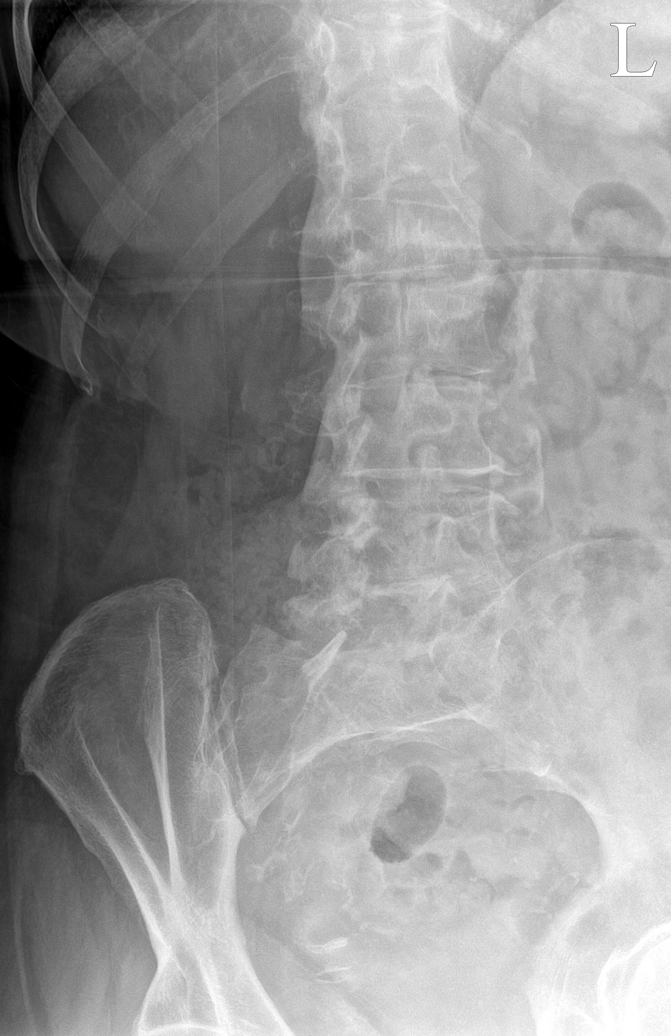

[l-spine lateral]
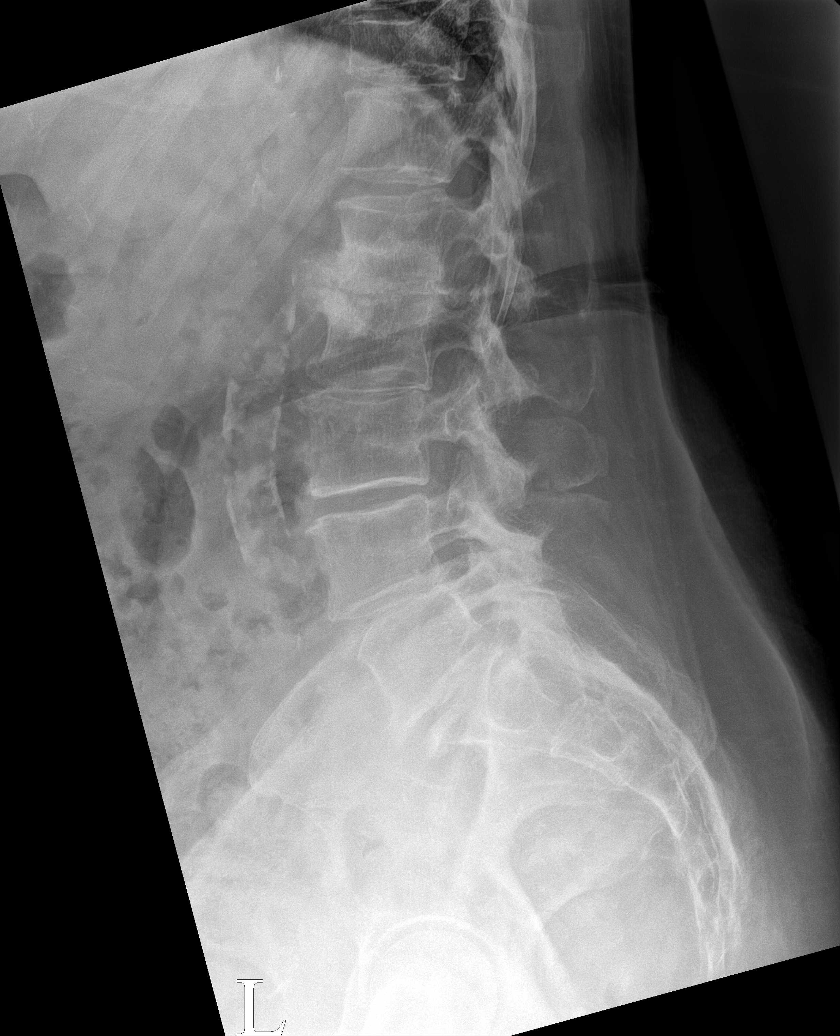

[l-spine spot]
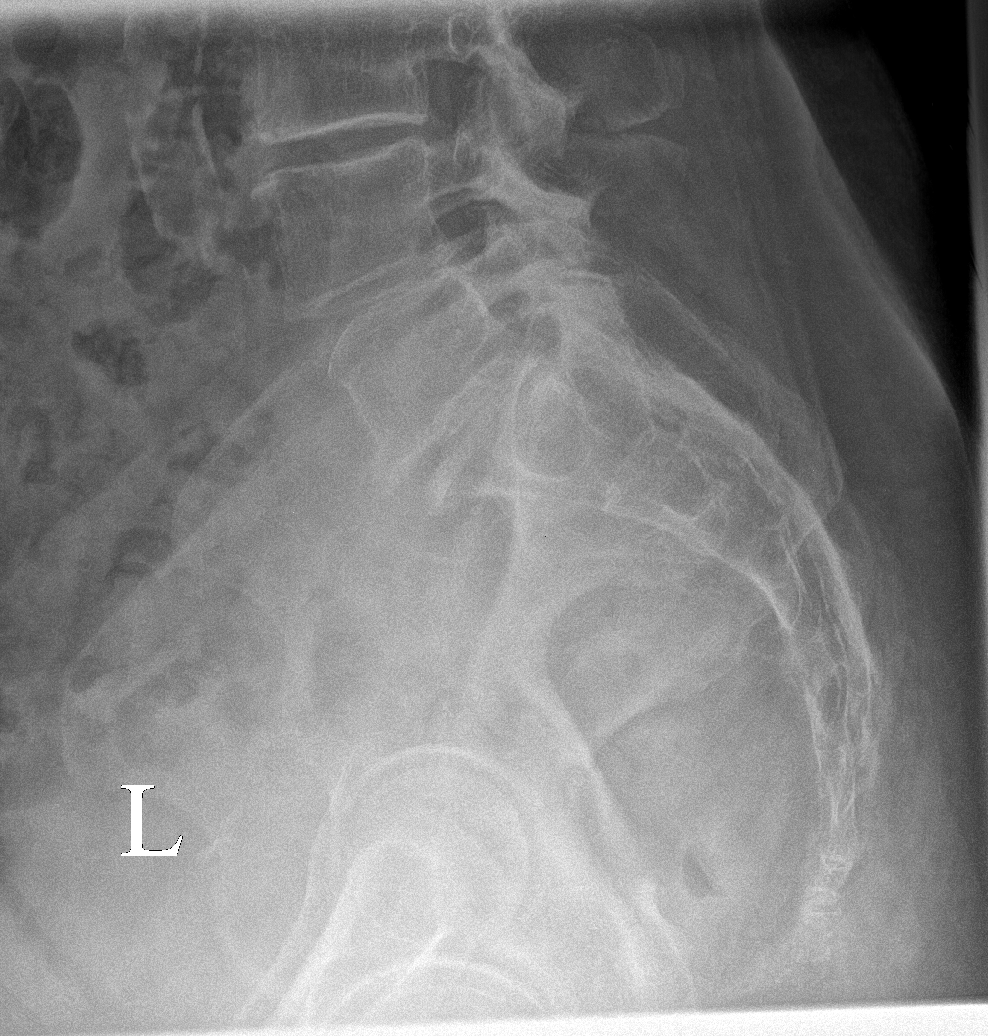

[5 of 5 positions shown; findings below may reference images not displayed]

FINDINGS: Scoliotic curvature of the lumbar spine, apex to the right. No
traumatic malalignment. Anterior wedging of T11 unchanged since the
CT scan of the chest from November 11, 2016. No acute fractures are
seen. Degenerative changes are seen most marked at L1-2 with
endplate sclerosis and anterior osteophyte. Calcified
atherosclerosis in the abdominal aorta.
IMPRESSION: 1. Chronic compression fracture of T11.  No acute fractures.
2. Degenerative changes as above.
3. Calcified atherosclerosis in the abdominal aorta.

## 2022-11-27 DIAGNOSIS — Z789 Other specified health status: Secondary | ICD-10-CM | POA: Diagnosis not present

## 2022-11-27 DIAGNOSIS — L820 Inflamed seborrheic keratosis: Secondary | ICD-10-CM | POA: Diagnosis not present

## 2022-11-27 DIAGNOSIS — L538 Other specified erythematous conditions: Secondary | ICD-10-CM | POA: Diagnosis not present

## 2022-11-27 DIAGNOSIS — L298 Other pruritus: Secondary | ICD-10-CM | POA: Diagnosis not present

## 2022-11-28 ENCOUNTER — Ambulatory Visit
Admission: RE | Admit: 2022-11-28 | Discharge: 2022-11-28 | Disposition: A | Discharge status: Home / Self Care | Payer: Medicare Other | Source: Ambulatory Visit | Attending: Gastroenterology | Admitting: Gastroenterology

## 2022-11-28 DIAGNOSIS — K8020 Calculus of gallbladder without cholecystitis without obstruction: Secondary | ICD-10-CM | POA: Diagnosis not present

## 2022-11-28 DIAGNOSIS — K7460 Unspecified cirrhosis of liver: Secondary | ICD-10-CM | POA: Diagnosis not present

## 2022-12-10 DIAGNOSIS — L821 Other seborrheic keratosis: Secondary | ICD-10-CM | POA: Diagnosis not present

## 2022-12-10 DIAGNOSIS — L570 Actinic keratosis: Secondary | ICD-10-CM | POA: Diagnosis not present

## 2022-12-11 DIAGNOSIS — Z23 Encounter for immunization: Secondary | ICD-10-CM | POA: Diagnosis not present

## 2022-12-22 NOTE — Progress Notes (Unsigned)
[name] [address] [place] [address] Phone: [phone] Subjective:    I'm seeing this patient by the request  of:  Irven Coe, MD  CC:   [id number]  09/24/2022 Repeat injection today and tolerated the procedure well, discussed icing regimen and home exercises.  Discussed hip abductor strengthening.  Differential includes lumbar radiculopathy.  May need to consider another injection in the back if necessary.  Discussed using the gabapentin on a more regular basis.  Follow-up with me again in 6 to 8 weeks      Update 12/23/2022 Nicole Barr is a 77 y.o. female coming in with complaint of R hip pain. Patient states        Past Medical History:  Diagnosis Date   Alcoholic cirrhosis (HCC)    Ascites    Coronary artery disease    Endometriosis    GERD (gastroesophageal reflux disease)    Hypertension    Liver cyst    Past Surgical History:  Procedure Laterality Date   CAROTID ENDARTERECTOMY Right 04/07/2006   egd with banding  04/07/2014   with bleeding after wards done at [facility]   ESOPHAGOGASTRODUODENOSCOPY (EGD) WITH PROPOFOL N/A 05/02/2015   Procedure: ESOPHAGOGASTRODUODENOSCOPY (EGD) WITH PROPOFOL;  Surgeon: Willis Modena, MD;  Location: [facility];  Service: Endoscopy;  Laterality: N/A;   ESOPHAGOGASTRODUODENOSCOPY (EGD) WITH PROPOFOL N/A 06/06/2015   Procedure: ESOPHAGOGASTRODUODENOSCOPY (EGD) WITH PROPOFOL;  Surgeon: Willis Modena, MD;  Location: [facility];  Service: Endoscopy;  Laterality: N/A;   GASTRIC VARICES BANDING N/A 05/02/2015   Procedure: GASTRIC VARICES BANDING;  Surgeon: Willis Modena, MD;  Location: [facility];  Service: Endoscopy;  Laterality: N/A;   HEMORROIDECTOMY  2008   banding   LAMINECTOMY  04/08/1987   lumbar 4-5   LAPAROSCOPY  04/08/1987   adhesions   oophrectomy Left 04/08/1987   TONSILLECTOMY     age 20   TRANSANAL EXCISION OF RECTAL MASS N/A 07/31/2021   Procedure:  TRANSANAL EXCISION OF DISTAL RECTAL POLYPOID MASS;  Surgeon: Andria Meuse, MD;  Location: [facility];  Service: General;  Laterality: N/A;   UMBILICAL HERNIA REPAIR  2020   Social History   Socioeconomic History   Marital status: Single    Spouse name: Not on file   Number of children: Not on file   Years of education: Not on file   Highest education level: Not on file  Occupational History   Not on file  Tobacco Use   Smoking status: Some Days    Current packs/day: 0.50    Average packs/day: 0.5 packs/day for 54.7 years (27.3 ttl pk-yrs)    Types: Cigarettes    Start date: 04/17/1968   Smokeless tobacco: Never  Vaping Use   Vaping status: Never Used  Substance and Sexual Activity   Alcohol use: No    Alcohol/week: 10.0 standard drinks of alcohol    Types: 10 Glasses of wine per week    Comment: currently states she is not drinking but states she usually had 2 glasses of wine per evening   Drug use: No   Sexual activity: Not on file  Other Topics Concern   Not on file  Social History Narrative   Not on file   Social Determinants of Health   Financial Resource Strain: Not on file  Food Insecurity: Not on file  Transportation Needs: Not on file  Physical Activity: Not on file  Stress: Not on file  Social Connections: Not on file  Allergies  Allergen Reactions   Advair Hfa [Fluticasone-Salmeterol] Anaphylaxis   Carvedilol Other (See Comments)    Hypertension (intolerance), Rapid Heart Rate Increases BP to 300 - per pt    Doxepin Hcl     Other reaction(s): does not do anything for her   Trazodone Hcl     Other reaction(s): in effective for sleep (2016)   Family History  Problem Relation Age of Onset   Diabetes Mother    Cancer Mother    Hypertension Mother    Hyperlipidemia Mother    Lupus Mother    Vitiligo Mother    Heart failure Father    Heart failure Maternal Grandmother      Current Outpatient Medications (Cardiovascular):    furosemide  (LASIX) 20 MG tablet, Take 20 mg by mouth daily.   inclisiran (LEQVIO) 284 MG/1.5ML SOSY injection, Inject 284 mg into the skin every 6 (six) months.   nadolol (CORGARD) 20 MG tablet, Take 1 tablet (20 mg total) by mouth daily.   spironolactone (ALDACTONE) 50 MG tablet, Take 50 mg by mouth every morning.  Current Outpatient Medications (Respiratory):    albuterol (VENTOLIN HFA) 108 (90 Base) MCG/ACT inhaler, Inhale 1-2 puffs into the lungs every 6 (six) hours as needed for wheezing or shortness of breath.    Current Outpatient Medications (Other):    Cholecalciferol 25 MCG (1000 UT) tablet, Take 1,000 Units by mouth See admin instructions. Every other day   gabapentin (NEURONTIN) 100 MG capsule, Take 1 capsule (100 mg total) by mouth at bedtime.   lactulose (CHRONULAC) 10 GM/15ML solution, 3-4 tablespoons daily by mouth   rifaximin (XIFAXAN) 550 MG TABS tablet, Take 550 mg by mouth 2 (two) times daily.   Reviewed prior external information including notes and imaging from  primary care provider As well as notes that were available from care everywhere and other healthcare systems.  Past medical history, social, surgical and family history all reviewed in electronic medical record.  No pertanent information unless stated regarding to the chief complaint.   Review of Systems:  No headache, visual changes, nausea, vomiting, diarrhea, constipation, dizziness, abdominal pain, skin rash, fevers, chills, night sweats, weight loss, swollen lymph nodes, body aches, joint swelling, chest pain, shortness of breath, mood changes. POSITIVE muscle aches  Objective  There were no vitals taken for this visit.   General: No apparent distress alert and oriented x3 mood and affect normal, dressed appropriately.  HEENT: Pupils equal, extraocular movements intact  Respiratory: Patient's speak in full sentences and does not appear short of breath  Cardiovascular: No lower extremity edema, non tender, no  erythema      Impression and Recommendations:

## 2022-12-23 ENCOUNTER — Ambulatory Visit (INDEPENDENT_AMBULATORY_CARE_PROVIDER_SITE_OTHER): Payer: Medicare Other | Admitting: Family Medicine

## 2022-12-23 ENCOUNTER — Encounter: Payer: Self-pay | Admitting: Family Medicine

## 2022-12-23 VITALS — BP 122/68 | HR 66 | Ht 66.0 in | Wt 167.0 lb

## 2022-12-23 DIAGNOSIS — M5441 Lumbago with sciatica, right side: Secondary | ICD-10-CM

## 2022-12-23 DIAGNOSIS — M5442 Lumbago with sciatica, left side: Secondary | ICD-10-CM

## 2022-12-23 DIAGNOSIS — G8929 Other chronic pain: Secondary | ICD-10-CM | POA: Diagnosis not present

## 2022-12-23 NOTE — Assessment & Plan Note (Signed)
Low back pain as well as the greater trochanteric bursitis.  For the moment feeling significantly better.  Using the gabapentin very intermittently.  Nothing that stopping her from activity and has had significant improvement in strength and being able to increase activity.  Follow-up again as needed

## 2022-12-25 DIAGNOSIS — K7460 Unspecified cirrhosis of liver: Secondary | ICD-10-CM | POA: Diagnosis not present

## 2023-01-06 DIAGNOSIS — L905 Scar conditions and fibrosis of skin: Secondary | ICD-10-CM | POA: Diagnosis not present

## 2023-01-06 DIAGNOSIS — D1801 Hemangioma of skin and subcutaneous tissue: Secondary | ICD-10-CM | POA: Diagnosis not present

## 2023-01-19 DIAGNOSIS — K7460 Unspecified cirrhosis of liver: Secondary | ICD-10-CM | POA: Diagnosis not present

## 2023-01-19 DIAGNOSIS — Z860100 Personal history of colon polyps, unspecified: Secondary | ICD-10-CM | POA: Diagnosis not present

## 2023-01-19 DIAGNOSIS — E663 Overweight: Secondary | ICD-10-CM | POA: Diagnosis not present

## 2023-01-20 ENCOUNTER — Other Ambulatory Visit: Payer: Self-pay | Admitting: Family Medicine

## 2023-01-20 DIAGNOSIS — M25551 Pain in right hip: Secondary | ICD-10-CM | POA: Diagnosis not present

## 2023-01-27 ENCOUNTER — Ambulatory Visit: Payer: Medicare Other

## 2023-01-27 VITALS — BP 148/77 | HR 68 | Temp 97.7°F | Resp 14 | Ht 66.0 in | Wt 168.2 lb

## 2023-01-27 DIAGNOSIS — I700 Atherosclerosis of aorta: Secondary | ICD-10-CM | POA: Diagnosis not present

## 2023-01-27 MED ORDER — INCLISIRAN SODIUM 284 MG/1.5ML ~~LOC~~ SOSY
284.0000 mg | PREFILLED_SYRINGE | Freq: Once | SUBCUTANEOUS | Status: AC
  Administered 2023-01-27: 284 mg via SUBCUTANEOUS
  Filled 2023-01-27: qty 1.5

## 2023-01-27 NOTE — Progress Notes (Signed)
Diagnosis: Aortic atherosclerosis   Provider:  Chilton Greathouse MD  Procedure: Injection  Leqvio (inclisiran), Dose: 284 mg, Site: subcutaneous, Number of injections: 1  Administered in right arm.  Post Care: Patient declined observation  Discharge: Condition: Good, Destination: Home . AVS Provided  Performed by:  Wyvonne Lenz, RN

## 2023-01-28 ENCOUNTER — Telehealth: Payer: Self-pay | Admitting: Pharmacist

## 2023-01-28 NOTE — Telephone Encounter (Signed)
Called pt and scheduled labs for next week s/p 2nd dose of Leqvio to assess efficacy.

## 2023-01-28 NOTE — Addendum Note (Signed)
Addended by: Jinger Middlesworth E on: 01/28/2023 08:44 AM   Modules accepted: Orders

## 2023-02-02 ENCOUNTER — Ambulatory Visit: Payer: Medicare Other | Attending: Cardiology

## 2023-02-02 ENCOUNTER — Ambulatory Visit
Admission: RE | Admit: 2023-02-02 | Discharge: 2023-02-02 | Disposition: A | Discharge status: Home / Self Care | Payer: Medicare Other | Source: Ambulatory Visit | Attending: Family Medicine | Admitting: Family Medicine

## 2023-02-02 DIAGNOSIS — E7800 Pure hypercholesterolemia, unspecified: Secondary | ICD-10-CM | POA: Diagnosis not present

## 2023-02-02 DIAGNOSIS — M25551 Pain in right hip: Secondary | ICD-10-CM | POA: Diagnosis not present

## 2023-02-02 DIAGNOSIS — M1611 Unilateral primary osteoarthritis, right hip: Secondary | ICD-10-CM | POA: Diagnosis not present

## 2023-02-03 LAB — HEPATIC FUNCTION PANEL
ALT: 21 [IU]/L (ref 0–32)
AST: 27 [IU]/L (ref 0–40)
Albumin: 4.6 g/dL (ref 3.8–4.8)
Alkaline Phosphatase: 76 [IU]/L (ref 44–121)
Bilirubin Total: 0.7 mg/dL (ref 0.0–1.2)
Bilirubin, Direct: 0.2 mg/dL (ref 0.00–0.40)
Total Protein: 6.7 g/dL (ref 6.0–8.5)

## 2023-02-03 LAB — LIPID PANEL
Chol/HDL Ratio: 2.7 ratio (ref 0.0–4.4)
Cholesterol, Total: 170 mg/dL (ref 100–199)
HDL: 64 mg/dL (ref >39)
LDL Chol Calc (NIH): 86 mg/dL (ref 0–99)
Triglycerides: 115 mg/dL (ref 0–149)
VLDL Cholesterol Cal: 20 mg/dL (ref 5–40)

## 2023-02-23 DIAGNOSIS — I8500 Esophageal varices without bleeding: Secondary | ICD-10-CM | POA: Diagnosis not present

## 2023-02-23 DIAGNOSIS — K7030 Alcoholic cirrhosis of liver without ascites: Secondary | ICD-10-CM | POA: Diagnosis not present

## 2023-02-23 DIAGNOSIS — N183 Chronic kidney disease, stage 3 unspecified: Secondary | ICD-10-CM | POA: Diagnosis not present

## 2023-02-23 DIAGNOSIS — E7800 Pure hypercholesterolemia, unspecified: Secondary | ICD-10-CM | POA: Diagnosis not present

## 2023-02-23 DIAGNOSIS — R7303 Prediabetes: Secondary | ICD-10-CM | POA: Diagnosis not present

## 2023-03-17 DIAGNOSIS — M533 Sacrococcygeal disorders, not elsewhere classified: Secondary | ICD-10-CM | POA: Diagnosis not present

## 2023-03-17 DIAGNOSIS — G8929 Other chronic pain: Secondary | ICD-10-CM | POA: Diagnosis not present

## 2023-03-17 DIAGNOSIS — M5441 Lumbago with sciatica, right side: Secondary | ICD-10-CM | POA: Diagnosis not present

## 2023-03-27 DIAGNOSIS — N958 Other specified menopausal and perimenopausal disorders: Secondary | ICD-10-CM | POA: Diagnosis not present

## 2023-03-27 DIAGNOSIS — M8588 Other specified disorders of bone density and structure, other site: Secondary | ICD-10-CM | POA: Diagnosis not present

## 2023-03-27 DIAGNOSIS — E2839 Other primary ovarian failure: Secondary | ICD-10-CM | POA: Diagnosis not present

## 2023-04-14 ENCOUNTER — Other Ambulatory Visit: Payer: Self-pay

## 2023-04-14 DIAGNOSIS — I6523 Occlusion and stenosis of bilateral carotid arteries: Secondary | ICD-10-CM

## 2023-04-20 ENCOUNTER — Telehealth: Payer: Self-pay

## 2023-04-20 ENCOUNTER — Encounter (HOSPITAL_COMMUNITY): Payer: Medicare Other

## 2023-04-20 DIAGNOSIS — M461 Sacroiliitis, not elsewhere classified: Secondary | ICD-10-CM | POA: Diagnosis not present

## 2023-04-20 DIAGNOSIS — M9905 Segmental and somatic dysfunction of pelvic region: Secondary | ICD-10-CM | POA: Diagnosis not present

## 2023-04-20 DIAGNOSIS — M9906 Segmental and somatic dysfunction of lower extremity: Secondary | ICD-10-CM | POA: Diagnosis not present

## 2023-04-20 DIAGNOSIS — M25551 Pain in right hip: Secondary | ICD-10-CM | POA: Diagnosis not present

## 2023-04-20 DIAGNOSIS — M9903 Segmental and somatic dysfunction of lumbar region: Secondary | ICD-10-CM | POA: Diagnosis not present

## 2023-04-20 DIAGNOSIS — M7918 Myalgia, other site: Secondary | ICD-10-CM | POA: Diagnosis not present

## 2023-04-20 DIAGNOSIS — R262 Difficulty in walking, not elsewhere classified: Secondary | ICD-10-CM | POA: Diagnosis not present

## 2023-04-20 DIAGNOSIS — M9904 Segmental and somatic dysfunction of sacral region: Secondary | ICD-10-CM | POA: Diagnosis not present

## 2023-04-20 DIAGNOSIS — M9902 Segmental and somatic dysfunction of thoracic region: Secondary | ICD-10-CM | POA: Diagnosis not present

## 2023-04-20 NOTE — Telephone Encounter (Signed)
 Auth Submission: NO AUTH NEEDED Site of care: Site of care: [facility] Payer: medicare a/b & aarp Medication & CPT/J Code(s) submitted: Leqvio  (Inclisiran) J1306 Route of submission (phone, fax, portal):  Phone # Fax # Auth type: Buy/Bill Units/visits requested: 284mg  x 2 doses Reference number:  Approval from: 10/15/22 to 05/07/24

## 2023-05-01 ENCOUNTER — Ambulatory Visit (HOSPITAL_COMMUNITY)
Admission: RE | Admit: 2023-05-01 | Discharge: 2023-05-01 | Disposition: A | Discharge status: Home / Self Care | Payer: Medicare Other | Source: Ambulatory Visit | Attending: Internal Medicine | Admitting: Internal Medicine

## 2023-05-01 DIAGNOSIS — I6523 Occlusion and stenosis of bilateral carotid arteries: Secondary | ICD-10-CM | POA: Diagnosis not present

## 2023-05-15 ENCOUNTER — Other Ambulatory Visit: Payer: Self-pay | Admitting: Gastroenterology

## 2023-05-15 DIAGNOSIS — K7460 Unspecified cirrhosis of liver: Secondary | ICD-10-CM

## 2023-05-18 ENCOUNTER — Ambulatory Visit
Admission: RE | Admit: 2023-05-18 | Discharge: 2023-05-18 | Disposition: A | Discharge status: Home / Self Care | Payer: Medicare Other | Source: Ambulatory Visit | Attending: Gastroenterology | Admitting: Gastroenterology

## 2023-05-18 DIAGNOSIS — K7460 Unspecified cirrhosis of liver: Secondary | ICD-10-CM | POA: Diagnosis not present

## 2023-05-18 DIAGNOSIS — K8020 Calculus of gallbladder without cholecystitis without obstruction: Secondary | ICD-10-CM | POA: Diagnosis not present

## 2023-05-27 DIAGNOSIS — R058 Other specified cough: Secondary | ICD-10-CM | POA: Diagnosis not present

## 2023-05-27 DIAGNOSIS — J329 Chronic sinusitis, unspecified: Secondary | ICD-10-CM | POA: Diagnosis not present

## 2023-05-27 DIAGNOSIS — J209 Acute bronchitis, unspecified: Secondary | ICD-10-CM | POA: Diagnosis not present

## 2023-05-27 DIAGNOSIS — R0981 Nasal congestion: Secondary | ICD-10-CM | POA: Diagnosis not present

## 2023-06-01 DIAGNOSIS — N183 Chronic kidney disease, stage 3 unspecified: Secondary | ICD-10-CM | POA: Diagnosis not present

## 2023-06-01 DIAGNOSIS — J988 Other specified respiratory disorders: Secondary | ICD-10-CM | POA: Diagnosis not present

## 2023-06-01 DIAGNOSIS — K7030 Alcoholic cirrhosis of liver without ascites: Secondary | ICD-10-CM | POA: Diagnosis not present

## 2023-06-04 ENCOUNTER — Telehealth: Payer: Self-pay | Admitting: Pulmonary Disease

## 2023-06-04 NOTE — Telephone Encounter (Signed)
 Patient can be reached at [phone] or via MyChart

## 2023-06-04 NOTE — Telephone Encounter (Signed)
 Patient has some congestions in the bottom of her lungs maybe mucus and bacteria. She would like for a sputum test to be ordered. He appointment for Dr.Dewald is scheduled for March 24. She has gone to her pcp and was given medication such as doxycyline, augmentin and Museux but it has not been helping.

## 2023-06-05 NOTE — Telephone Encounter (Signed)
 Called and spoke with patient advised to follow up with ED and per [facility] recommendation of Delsym  every 12 hours. Pt stated that she was panic and wasn't sure on what the next were. She stated that she will go with recommendation that she was given. And follow up if needed.

## 2023-06-05 NOTE — Telephone Encounter (Signed)
 Symptoms started on 2/19, she went to the [place] clinic and was given Doxycycline, on 2/24 saw NP, put on Augmentin and mucinex DM.  She had a constant flow of coughing (choking, violent) with Doxycycline, in the nasal passages and drips into the chest.She has had symptoms for about 3 weeks.  She has a cough with white to yellow.  Some brownish/tan (1-2 times).  Coughing up some bright red mucous streaked.  3 days ago she may have a clot.  Throat is sore from coughing.  No fever, had chills and then sweats, no body aches.  She is getting in the shower with hot steam and it is breaking up the mucous.  She does not have a nebulizer, just an albuterol inhaler.  She has not used the albuterol because one of the inhalers caused her throat closed up and she is afraid to use it.  She cannot breathe as deep as she could before.  She had been walking 5 miles and is only able to do 1.5 miles now.  She is feeling a tiny bit better on the Augmentin, Sunday night is the last day of the Augmentin.  She said she does not mind waiting until next week, she just panicked after she started coughing violently, she is all alone.  Advised would get the message to [facility] and then one of [place] will her back.  She verbalized understanding.  Beth, please advise and send back to triage.  Thank you.

## 2023-06-05 NOTE — Telephone Encounter (Signed)
 Pt is aware of recommendations and voiced her understanding. She will present to ED.  Nothing further needed.

## 2023-06-05 NOTE — Telephone Encounter (Signed)
 We haven't seen patient since May and can't not triage over the phone, based on hemoptysis symptoms she needs to be evaluated by ED. Recommend cough suppressant called dextromethorphan (delsym) every 12 hours.

## 2023-06-06 ENCOUNTER — Emergency Department (HOSPITAL_COMMUNITY)

## 2023-06-06 ENCOUNTER — Other Ambulatory Visit: Payer: Self-pay

## 2023-06-06 ENCOUNTER — Encounter (HOSPITAL_COMMUNITY): Payer: Self-pay | Admitting: Emergency Medicine

## 2023-06-06 ENCOUNTER — Emergency Department (HOSPITAL_COMMUNITY)
Admission: EM | Admit: 2023-06-06 | Discharge: 2023-06-06 | Disposition: A | Discharge status: Home / Self Care | Attending: Emergency Medicine | Admitting: Emergency Medicine

## 2023-06-06 DIAGNOSIS — I2510 Atherosclerotic heart disease of native coronary artery without angina pectoris: Secondary | ICD-10-CM | POA: Insufficient documentation

## 2023-06-06 DIAGNOSIS — R042 Hemoptysis: Secondary | ICD-10-CM | POA: Diagnosis not present

## 2023-06-06 DIAGNOSIS — M48061 Spinal stenosis, lumbar region without neurogenic claudication: Secondary | ICD-10-CM | POA: Diagnosis not present

## 2023-06-06 DIAGNOSIS — M4186 Other forms of scoliosis, lumbar region: Secondary | ICD-10-CM | POA: Diagnosis not present

## 2023-06-06 DIAGNOSIS — M5136 Other intervertebral disc degeneration, lumbar region with discogenic back pain only: Secondary | ICD-10-CM | POA: Diagnosis not present

## 2023-06-06 DIAGNOSIS — M4807 Spinal stenosis, lumbosacral region: Secondary | ICD-10-CM | POA: Diagnosis not present

## 2023-06-06 DIAGNOSIS — R059 Cough, unspecified: Secondary | ICD-10-CM | POA: Diagnosis not present

## 2023-06-06 LAB — CBC WITH DIFFERENTIAL/PLATELET
Abs Immature Granulocytes: 0.01 10*3/uL (ref 0.00–0.07)
Basophils Absolute: 0 10*3/uL (ref 0.0–0.1)
Basophils Relative: 1 %
Eosinophils Absolute: 0.3 10*3/uL (ref 0.0–0.5)
Eosinophils Relative: 5 %
HCT: 39.6 % (ref 36.0–46.0)
Hemoglobin: 13.2 g/dL (ref 12.0–15.0)
Immature Granulocytes: 0 %
Lymphocytes Relative: 29 %
Lymphs Abs: 1.5 10*3/uL (ref 0.7–4.0)
MCH: 29.9 pg (ref 26.0–34.0)
MCHC: 33.3 g/dL (ref 30.0–36.0)
MCV: 89.8 fL (ref 80.0–100.0)
Monocytes Absolute: 0.5 10*3/uL (ref 0.1–1.0)
Monocytes Relative: 10 %
Neutro Abs: 2.9 10*3/uL (ref 1.7–7.7)
Neutrophils Relative %: 55 %
Platelets: 256 10*3/uL (ref 150–400)
RBC: 4.41 MIL/uL (ref 3.87–5.11)
RDW: 13.2 % (ref 11.5–15.5)
WBC: 5.2 10*3/uL (ref 4.0–10.5)
nRBC: 0 % (ref 0.0–0.2)

## 2023-06-06 LAB — COMPREHENSIVE METABOLIC PANEL
ALT: 24 U/L (ref 0–44)
AST: 31 U/L (ref 15–41)
Albumin: 4.3 g/dL (ref 3.5–5.0)
Alkaline Phosphatase: 62 U/L (ref 38–126)
Anion gap: 10 (ref 5–15)
BUN: 16 mg/dL (ref 8–23)
CO2: 25 mmol/L (ref 22–32)
Calcium: 9.6 mg/dL (ref 8.9–10.3)
Chloride: 103 mmol/L (ref 98–111)
Creatinine, Ser: 0.87 mg/dL (ref 0.44–1.00)
GFR, Estimated: >60 mL/min (ref >60)
Glucose, Bld: 120 mg/dL — ABNORMAL HIGH (ref 70–99)
Potassium: 3.7 mmol/L (ref 3.5–5.1)
Sodium: 138 mmol/L (ref 135–145)
Total Bilirubin: 0.9 mg/dL (ref 0.0–1.2)
Total Protein: 7.3 g/dL (ref 6.5–8.1)

## 2023-06-06 LAB — RESP PANEL BY RT-PCR (RSV, FLU A&B, COVID)  RVPGX2
Influenza A by PCR: NEGATIVE
Influenza B by PCR: NEGATIVE
Resp Syncytial Virus by PCR: NEGATIVE
SARS Coronavirus 2 by RT PCR: NEGATIVE

## 2023-06-06 MED ORDER — BENZONATATE 100 MG PO CAPS: 100.0000 mg | ORAL_CAPSULE | Freq: Three times a day (TID) | ORAL | 0 refills | Status: DC

## 2023-06-06 NOTE — Discharge Instructions (Addendum)
 It was a pleasure taking part in your care.  As discussed, I believe that you have bronchitis.  Please finish your course of antibiotics.  Please begin taking Tessalon Perles every 8 hours as needed for cough.  Follow-up with your pulmonology team.  Return to the ED with any new or worsening symptoms.

## 2023-06-06 NOTE — ED Provider Notes (Signed)
 [facility] AT [facility] Provider Note   CSN: [id number] Arrival date & time: 06/06/23  0532     History  Chief Complaint  Patient presents with   Hemoptysis    Nicole Barr is a 78 y.o. female with medical history of alcoholic cirrhosis, CAD, endometriosis, GERD, dyspnea on exertion, tobacco use.  Patient presents to ED for evaluation of hemoptysis, cough.  States over the last 4 weeks she has had a persistent cough.  Reports that she has been placed on Augmentin, doxycycline by her PCP for suspected pneumonia.  States that in the last 1 week she started having some blood-tinged sputum "streaks" of blood when she coughs.  Reports that she was advised to come to the ED for workup due to this.  She denies any shortness of breath or chest pain.  She denies lightheadedness, dizziness or weakness.  Denies fevers, nausea or vomiting at home.  Denies a history of DVT or PE, exogenous hormone use, recent surgery or travel, leg swelling.  HPI     Home Medications Prior to Admission medications   Medication Sig Start Date End Date Taking? Authorizing Provider  benzonatate (TESSALON) 100 MG capsule Take 1 capsule (100 mg total) by mouth every 8 (eight) hours. 06/06/23  Yes Al Decant, PA-C  albuterol (VENTOLIN HFA) 108 (90 Base) MCG/ACT inhaler Inhale 1-2 puffs into the lungs every 6 (six) hours as needed for wheezing or shortness of breath.    [provider]  Cholecalciferol 25 MCG (1000 UT) tablet Take 1,000 Units by mouth See admin instructions. Every other day    [provider]  furosemide (LASIX) 20 MG tablet Take 20 mg by mouth daily.    [provider]  gabapentin (NEURONTIN) 100 MG capsule Take 1 capsule (100 mg total) by mouth at bedtime. 09/24/22   Judi Saa, DO  inclisiran (LEQVIO) 284 MG/1.5ML SOSY injection Inject 284 mg into the skin every 6 (six) months.    [provider]  lactulose  (CHRONULAC) 10 GM/15ML solution 3-4 tablespoons daily by mouth    [provider]  nadolol (CORGARD) 20 MG tablet Take 1 tablet (20 mg total) by mouth daily. 08/26/22   Chandrasekhar, Rondel Jumbo, MD  rifaximin (XIFAXAN) 550 MG TABS tablet Take 550 mg by mouth 2 (two) times daily.    [provider]  spironolactone (ALDACTONE) 50 MG tablet Take 50 mg by mouth every morning.    [provider]      Allergies    Advair hfa [fluticasone-salmeterol], Carvedilol, Doxepin hcl, and Trazodone hcl    Review of Systems   Review of Systems  Respiratory:  Positive for cough. Negative for shortness of breath.        Hemoptysis  Cardiovascular:  Negative for chest pain.  All other systems reviewed and are negative.   Physical Exam Updated Vital Signs BP (!) 154/61 (BP Location: Left Arm)   Pulse 63   Temp 98 F (36.7 C) (Oral)   Resp 18   Ht 5\' 6"  (1.676 m)   Wt 77.1 kg   SpO2 94%   BMI 27.43 kg/m  Physical Exam Vitals and nursing note reviewed.  Constitutional:      General: She is not in acute distress.    Appearance: She is well-developed.  HENT:     Head: Normocephalic and atraumatic.  Eyes:     Conjunctiva/sclera: Conjunctivae normal.  Cardiovascular:     Rate and Rhythm: Normal  rate and regular rhythm.     Heart sounds: No murmur heard. Pulmonary:     Effort: Pulmonary effort is normal. No respiratory distress.     Breath sounds: Normal breath sounds.  Abdominal:     Palpations: Abdomen is soft.     Tenderness: There is no abdominal tenderness.  Musculoskeletal:        General: No swelling.     Cervical back: Neck supple.  Skin:    General: Skin is warm and dry.     Capillary Refill: Capillary refill takes less than 2 seconds.  Neurological:     Mental Status: She is alert.  Psychiatric:        Mood and Affect: Mood normal.     ED Results / Procedures / Treatments   Labs (all labs ordered are listed, but only abnormal results are  displayed) Labs Reviewed  COMPREHENSIVE METABOLIC PANEL - Abnormal; Notable for the following components:      Result Value   Glucose, Bld 120 (*)    All other components within normal limits  RESP PANEL BY RT-PCR (RSV, FLU A&B, COVID)  [id number]  CBC WITH DIFFERENTIAL/PLATELET    EKG None  Radiology DG Chest Port 1 View Result Date: 06/06/2023 CLINICAL DATA:  Cough EXAM: PORTABLE CHEST 1 VIEW COMPARISON:  07/02/2022 FINDINGS: Normal heart size and mediastinal contours. No acute infiltrate or edema. No effusion or pneumothorax. No acute osseous findings. IMPRESSION: No evidence of active disease. Electronically Signed   By: Tiburcio Pea M.D.   On: 06/06/2023 08:13    Procedures Procedures   Medications Ordered in ED Medications - No data to display  ED Course/ Medical Decision Making/ A&P  Medical Decision Making Amount and/or Complexity of Data Reviewed Labs: ordered.   78 year old female presents for evaluation.  Please see HPI for further details.  On examination the patient is afebrile and nontachycardic.  Her lung sounds are clear bilaterally, she is not hypoxic on room air.  Abdomen soft compressible.  Neurological examinations are baseline.  Posterior oropharynx without erythema exudate, uvula midline.  Suspect postviral bronchitis.  Patient reports lingering cough for 4 weeks.  CBC without leukocytosis or anemia.  Metabolic panel without electrolyte derangement.  Viral panel negative for all.  Chest x-ray is clear.  Consideration was given to the patient having a PE however this seems less likely.  The patient has had a persistent cough for 4 weeks and then began having hemoptysis more recently.  Suspect postviral bronchitis as being responsible for hemoptysis.  Denies a history of DVT or PE, exogenous hormone use, recent surgery or travel, leg swelling so I feel this is less likely.  Will send patient with Tessalon Perles.  Will have her follow-up with her pulmonology  team.  Have advised her to complete course of antibiotics that she was provided.  She voiced understanding.  She had all of her questions answered to her satisfaction.  She is stable to discharge home.   Final Clinical Impression(s) / ED Diagnoses Final diagnoses:  Cough with hemoptysis    Rx / DC Orders ED Discharge Orders          Ordered    benzonatate (TESSALON) 100 MG capsule  Every 8 hours        06/06/23 1246              Clent Ridges 06/06/23 1247    Benjiman Core, MD 06/06/23 1513

## 2023-06-06 NOTE — ED Triage Notes (Signed)
 Pt presents to the ED via POV with complaints of hemoptysis x 2 weeks. Pt states that she has had cough and nasal congestion for several weeks and has started having some red colored streaks mixed in her mucous 3 days ago. Endorses a sore throat due to persistent coughing over the last several days. A&Ox4 at this time. Denies CP or SOB.

## 2023-06-15 DIAGNOSIS — M25561 Pain in right knee: Secondary | ICD-10-CM | POA: Diagnosis not present

## 2023-06-15 DIAGNOSIS — M9905 Segmental and somatic dysfunction of pelvic region: Secondary | ICD-10-CM | POA: Diagnosis not present

## 2023-06-15 DIAGNOSIS — M9906 Segmental and somatic dysfunction of lower extremity: Secondary | ICD-10-CM | POA: Diagnosis not present

## 2023-06-15 DIAGNOSIS — M9903 Segmental and somatic dysfunction of lumbar region: Secondary | ICD-10-CM | POA: Diagnosis not present

## 2023-06-15 DIAGNOSIS — M461 Sacroiliitis, not elsewhere classified: Secondary | ICD-10-CM | POA: Diagnosis not present

## 2023-06-15 DIAGNOSIS — G629 Polyneuropathy, unspecified: Secondary | ICD-10-CM | POA: Diagnosis not present

## 2023-06-15 DIAGNOSIS — M9908 Segmental and somatic dysfunction of rib cage: Secondary | ICD-10-CM | POA: Diagnosis not present

## 2023-06-15 DIAGNOSIS — M79661 Pain in right lower leg: Secondary | ICD-10-CM | POA: Diagnosis not present

## 2023-06-15 DIAGNOSIS — M9904 Segmental and somatic dysfunction of sacral region: Secondary | ICD-10-CM | POA: Diagnosis not present

## 2023-06-15 DIAGNOSIS — M7918 Myalgia, other site: Secondary | ICD-10-CM | POA: Diagnosis not present

## 2023-06-15 DIAGNOSIS — M961 Postlaminectomy syndrome, not elsewhere classified: Secondary | ICD-10-CM | POA: Diagnosis not present

## 2023-06-15 DIAGNOSIS — M62830 Muscle spasm of back: Secondary | ICD-10-CM | POA: Diagnosis not present

## 2023-06-30 ENCOUNTER — Encounter: Payer: Self-pay | Admitting: Pulmonary Disease

## 2023-06-30 ENCOUNTER — Ambulatory Visit (INDEPENDENT_AMBULATORY_CARE_PROVIDER_SITE_OTHER): Payer: Medicare Other | Admitting: Pulmonary Disease

## 2023-06-30 VITALS — BP 147/78 | HR 69 | Ht 66.0 in | Wt 169.6 lb

## 2023-06-30 DIAGNOSIS — J4520 Mild intermittent asthma, uncomplicated: Secondary | ICD-10-CM | POA: Diagnosis not present

## 2023-06-30 DIAGNOSIS — Z720 Tobacco use: Secondary | ICD-10-CM

## 2023-06-30 DIAGNOSIS — R053 Chronic cough: Secondary | ICD-10-CM | POA: Diagnosis not present

## 2023-06-30 DIAGNOSIS — R058 Other specified cough: Secondary | ICD-10-CM

## 2023-06-30 DIAGNOSIS — F17210 Nicotine dependence, cigarettes, uncomplicated: Secondary | ICD-10-CM

## 2023-06-30 MED ORDER — INCRUSE ELLIPTA 62.5 MCG/ACT IN AEPB: 1.0000 | INHALATION_SPRAY | Freq: Every day | RESPIRATORY_TRACT | 2 refills | Status: DC

## 2023-06-30 NOTE — Progress Notes (Signed)
 Synopsis: Referred in November 2023 for bronchitis, self-referred  Subjective:   PATIENT ID: Nicole Barr GENDER: female DOB: 1945/10/12, MRN: [id number]  HPI  Chief Complaint  Patient presents with   Follow-up    Pt states she had a pretty bad year illness, sports injuries   Nicole Barr is a 78 year old woman, daily smoker with alcoholic cirrhosis, coronary artery disease and GERD who returns to pulmonary clinic for reactive airways disease.   Initial OV 02/11/22 She reports being treated for bronchitis about a month ago with Augmentin and prednisone taper which has improved her symptoms.   She has history of bronchitis episodes at least once per year. She reports seasonal allergies in the fall. She has gained about 30lbs and is hoping to increase her physical activity with running to help lose weight. She denies heart burn or reflux.   She is followed by GI for her cirrhosis and is on spironolactone and lasix.   She denies night time respiratory symptoms. She has trouble sleeping due to mental activity.    She is smoking 5 cigarettes per day. She has been smoking for 15 years. Retired from [employer]. 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 No pets at home.   OV 08/14/22 She was seen 07/02/22 by Nicole Croft, NP for shortness of breath. Her PFTs showed evidence of air trapping. She started on advair HFA 45-26mcg at last visit and has only been using it 1 puff daily. She has noted some cough and mucous production after taking it.   She continues to smoke.   OV 06/30/23 She has been experiencing a persistent cough and mucus production since February 19th, approximately two months ago. Initial treatment with doxycycline was ineffective, leading to increased mucus production. A subsequent course of Augmentin showed some improvement after five days, but symptoms did not fully resolve.  She remains concerned about the adequacy of her treatment and the possibility of an unresolved bacterial infection.  She experiences difficulty with breathing and finds her albuterol inhaler ineffective. She previously tried Advair but had adverse effects, including swelling and airway closure. She is uncertain if her symptoms are due to asthma or environmental factors, such as mold in her apartment.  She has a history of smoking, currently smoking one or two cigarettes a day, primarily when frustrated. She wants to quit smoking and is considering nicotine replacement options. She is concerned about the potential development of emphysema and the impact of smoking on her respiratory health.  Past Medical History:  Diagnosis Date   Alcoholic cirrhosis (HCC)    Ascites    Coronary artery disease    Endometriosis    GERD (gastroesophageal reflux disease)    Hypertension    Liver cyst      Family History  Problem Relation Age of Onset   Diabetes Mother    Cancer Mother    Hypertension Mother    Hyperlipidemia Mother    Lupus Mother    Vitiligo Mother    Heart failure Father    Heart failure Maternal Grandmother      Social History   Socioeconomic History   Marital status: Single    Spouse name: Not on file   Number of children: Not on file   Years of education: Not on file   Highest education level: Not on file  Occupational History   Not on file  Tobacco Use  Smoking status: Some Days    Current packs/day: 0.50    Average packs/day: 0.5 packs/day for 55.2 years (27.6 ttl pk-yrs)    Types: Cigarettes    Start date: 04/17/1968   Smokeless tobacco: Never  Vaping Use   Vaping status: Never Used  Substance and Sexual Activity   Alcohol use: No    Alcohol/week: 10.0 standard drinks of alcohol    Types: 10 Glasses of wine per week    Comment: currently states she is not drinking but states she usually had 2 glasses of wine per evening   Drug use: No   Sexual activity: Not  on file  Other Topics Concern   Not on file  Social History Narrative   Not on file   Social Drivers of Health   Financial Resource Strain: Not on file  Food Insecurity: Not on file  Transportation Needs: Not on file  Physical Activity: Not on file  Stress: Not on file  Social Connections: Not on file  Intimate Partner Violence: Not on file     Allergies  Allergen Reactions   Advair Hfa [Fluticasone-Salmeterol] Anaphylaxis   Carvedilol Other (See Comments)    Hypertension (intolerance), Rapid Heart Rate Increases BP to 300 - per pt    Doxepin Hcl     Other reaction(s): does not do anything for her   Doxycycline Other (See Comments)    Not specified   Trazodone Hcl     Other reaction(s): in effective for sleep (2016)     Outpatient Medications Prior to Visit  Medication Sig Dispense Refill   albuterol (VENTOLIN HFA) 108 (90 Base) MCG/ACT inhaler Inhale 1-2 puffs into the lungs every 6 (six) hours as needed for wheezing or shortness of breath.     furosemide (LASIX) 20 MG tablet Take 20 mg by mouth daily.     gabapentin (NEURONTIN) 100 MG capsule Take 1 capsule (100 mg total) by mouth at bedtime. 90 capsule 0   inclisiran (LEQVIO) 284 MG/1.5ML SOSY injection Inject 284 mg into the skin every 6 (six) months.     lactulose (CHRONULAC) 10 GM/15ML solution 3-4 tablespoons daily by mouth     nadolol (CORGARD) 20 MG tablet Take 1 tablet (20 mg total) by mouth daily. 90 tablet 3   rifaximin (XIFAXAN) 550 MG TABS tablet Take 550 mg by mouth 2 (two) times daily.     spironolactone (ALDACTONE) 50 MG tablet Take 50 mg by mouth every morning.     benzonatate (TESSALON) 100 MG capsule Take 1 capsule (100 mg total) by mouth every 8 (eight) hours. (Patient not taking: Reported on 06/30/2023) 21 capsule 0   Cholecalciferol 25 MCG (1000 UT) tablet Take 1,000 Units by mouth See admin instructions. Every other day (Patient not taking: Reported on 06/30/2023)     No facility-administered  medications prior to visit.   Review of Systems  Constitutional:  Negative for chills, fever, malaise/fatigue and weight loss.  HENT:  Negative for congestion, sinus pain and sore throat.   Eyes: Negative.   Respiratory:  Positive for cough, sputum production and shortness of breath. Negative for hemoptysis and wheezing.   Cardiovascular:  Negative for chest pain, palpitations, orthopnea, claudication and leg swelling.  Gastrointestinal:  Negative for abdominal pain, heartburn, nausea and vomiting.  Genitourinary: Negative.   Musculoskeletal:  Negative for joint pain and myalgias.  Skin:  Negative for rash.  Neurological:  Negative for weakness.  Endo/Heme/Allergies: Negative.   Psychiatric/Behavioral: Negative.     Objective:  Vitals:   06/30/23 0907  BP: (!) 147/78  Pulse: 69  SpO2: 99%  Weight: 169 lb 9.6 oz (76.9 kg)  Height: 5\' 6"  (1.676 m)   Physical Exam Constitutional:      General: She is not in acute distress.    Appearance: She is not ill-appearing.  HENT:     Head: Normocephalic and atraumatic.  Eyes:     General: No scleral icterus. Cardiovascular:     Rate and Rhythm: Normal rate and regular rhythm.     Pulses: Normal pulses.     Heart sounds: Normal heart sounds. No murmur heard. Pulmonary:     Effort: Pulmonary effort is normal.     Breath sounds: Normal breath sounds. No wheezing, rhonchi or rales.  Musculoskeletal:     Right lower leg: No edema.     Left lower leg: No edema.  Skin:    General: Skin is warm and dry.  Neurological:     General: No focal deficit present.     Mental Status: She is alert.    CBC    Component Value Date/Time   WBC 5.2 06/06/2023 0556   RBC 4.41 06/06/2023 0556   HGB 13.2 06/06/2023 0556   HGB 13.0 05/13/2019 0923   HCT 39.6 06/06/2023 0556   HCT 37.8 05/13/2019 0923   PLT 256 06/06/2023 0556   PLT 227 05/13/2019 0923   MCV 89.8 06/06/2023 0556   MCV 90 05/13/2019 0923   MCH 29.9 06/06/2023 0556   MCHC 33.3  06/06/2023 0556   RDW 13.2 06/06/2023 0556   RDW 12.1 05/13/2019 0923   LYMPHSABS 1.5 06/06/2023 0556   LYMPHSABS 1.4 04/21/2018 0734   MONOABS 0.5 06/06/2023 0556   EOSABS 0.3 06/06/2023 0556   EOSABS 0.2 04/21/2018 0734   BASOSABS 0.0 06/06/2023 0556   BASOSABS 0.0 04/21/2018 0734      Latest Ref Rng & Units 06/06/2023    5:56 AM 07/02/2022    3:18 PM 07/31/2021   11:55 AM  BMP  Glucose 70 - 99 mg/dL 161  096  045   BUN 8 - 23 mg/dL 16  17  12    Creatinine 0.44 - 1.00 mg/dL 4.09  8.11  9.14   Sodium 135 - 145 mmol/L 138  139  139   Potassium 3.5 - 5.1 mmol/L 3.7  3.9  3.6   Chloride 98 - 111 mmol/L 103  105  105   CO2 22 - 32 mmol/L 25  25  25    Calcium 8.9 - 10.3 mg/dL 9.6  9.7  9.7    Chest imaging: CXR 06/06/23 Normal heart size and mediastinal contours. No acute infiltrate or edema. No effusion or pneumothorax. No acute osseous findings.  CT 07/07/22 1. Atheromatous changes. 2. Stable right lower lobe lung nodule. 3. No acute cardiopulmonary process. 4. Cirrhotic liver with a stable 2 cm cyst. 5. Cholelithiasis. 6. Stable T11 compression fracture and degenerative changes.  CT Chest 11/2016 Cardiovascular: Normal heart size. No pericardial effusion. Atherosclerosis of the aorta and coronaries. Status post coronary calcium scoring in 2016. No acute finding in the great vessels.   Mediastinum/Nodes: Calcified mediastinal lymph nodes. No enlarged lymph nodes.   Lungs/Pleura: The 6 mm right lower lobe pulmonary nodule seen on 3:111 is size stable compared to 2016. Size is mildly overestimated due to closely neighboring vessel. No new pulmonary nodule is seen. There is no edema, consolidation, effusion, or pneumothorax.  PFT:    Latest Ref Rng & Units  07/02/2022    8:55 AM  PFT Results  FVC-Pre L 3.16   FVC-Predicted Pre % 101   FVC-Post L 3.07   FVC-Predicted Post % 98   Pre FEV1/FVC % % 78   Post FEV1/FCV % % 77   FEV1-Pre L 2.47   FEV1-Predicted Pre % 104    FEV1-Post L 2.36   DLCO uncorrected ml/min/mmHg 16.72   DLCO UNC% % 80   DLCO corrected ml/min/mmHg 16.72   DLCO COR %Predicted % 80   DLVA Predicted % 77   TLC L 6.38   TLC % Predicted % 117   RV % Predicted % 131     Labs:  Path:  Echo 2022: LV EF 70-75%. Hyperdynamic function. RV systolic function and size is normal. LA and RA are mildly dilated.   Heart Catheterization:  Assessment & Plan:   Mild intermittent asthma without complication  Productive cough  Discussion: Nicole Barr is a 78 year old woman, daily smoker with alcoholic cirrhosis, coronary artery disease and GERD who returns to pulmonary clinic for reactive airways disease  Asthma Intolerant of ICS/LABA inhaler previously Symptoms no controlled with SABA. - Prescribe Incruse inhaler, one puff daily. - Advise continued use of albuterol inhaler as needed.  Chronic Cough with Mucus Production Persistent cough with mucus despite partial improvement with antibiotics. Possible bacterial infection or environmental factors like mold exposure considered. Recent chest x-ray unremarkable. - Provide sputum cup for culture. - Advise bringing sputum sample within four hours of collection.  Nicotine Dependence Desires smoking cessation to prevent emphysema. Discussed nicotine replacement therapy options for 3 minutes. - Recommend 7 mg nicotine patch. - Suggest mini nicotine lozenges for stress. - Advise purchasing nicotine patches and lozenges over the counter.  Follow up in 6 months.  Nicole Comas, MD [facility] Office: [phone]   Current Outpatient Medications:    albuterol (VENTOLIN HFA) 108 (90 Base) MCG/ACT inhaler, Inhale 1-2 puffs into the lungs every 6 (six) hours as needed for wheezing or shortness of breath., Disp: , Rfl:    furosemide (LASIX) 20 MG tablet, Take 20 mg by mouth daily., Disp: , Rfl:    gabapentin (NEURONTIN) 100 MG capsule, Take 1 capsule (100 mg  total) by mouth at bedtime., Disp: 90 capsule, Rfl: 0   inclisiran (LEQVIO) 284 MG/1.5ML SOSY injection, Inject 284 mg into the skin every 6 (six) months., Disp: , Rfl:    lactulose (CHRONULAC) 10 GM/15ML solution, 3-4 tablespoons daily by mouth, Disp: , Rfl:    nadolol (CORGARD) 20 MG tablet, Take 1 tablet (20 mg total) by mouth daily., Disp: 90 tablet, Rfl: 3   rifaximin (XIFAXAN) 550 MG TABS tablet, Take 550 mg by mouth 2 (two) times daily., Disp: , Rfl:    spironolactone (ALDACTONE) 50 MG tablet, Take 50 mg by mouth every morning., Disp: , Rfl:    benzonatate (TESSALON) 100 MG capsule, Take 1 capsule (100 mg total) by mouth every 8 (eight) hours. (Patient not taking: Reported on 06/30/2023), Disp: 21 capsule, Rfl: 0   Cholecalciferol 25 MCG (1000 UT) tablet, Take 1,000 Units by mouth See admin instructions. Every other day (Patient not taking: Reported on 06/30/2023), Disp: , Rfl:

## 2023-06-30 NOTE — Patient Instructions (Addendum)
 Use albuterol inhaler 1-2 puffs every 4-6 hours as needed  Start incruse inhaler 1 puff daily  Recommend quitting smoking with nicotine replacement therapy - use 7mg  nicotine patches daily - use mini nicotine lozenges 2mg  as needed for break through cravings  We will send you home with sputum cups to check, please bring back in within 4 hours of coughing up the mucous  Follow up in 6 months

## 2023-07-01 DIAGNOSIS — R058 Other specified cough: Secondary | ICD-10-CM | POA: Diagnosis not present

## 2023-07-02 DIAGNOSIS — M5136 Other intervertebral disc degeneration, lumbar region with discogenic back pain only: Secondary | ICD-10-CM | POA: Diagnosis not present

## 2023-07-02 DIAGNOSIS — M4186 Other forms of scoliosis, lumbar region: Secondary | ICD-10-CM | POA: Diagnosis not present

## 2023-07-02 DIAGNOSIS — M48061 Spinal stenosis, lumbar region without neurogenic claudication: Secondary | ICD-10-CM | POA: Diagnosis not present

## 2023-07-02 DIAGNOSIS — M4807 Spinal stenosis, lumbosacral region: Secondary | ICD-10-CM | POA: Diagnosis not present

## 2023-07-02 DIAGNOSIS — M5441 Lumbago with sciatica, right side: Secondary | ICD-10-CM | POA: Diagnosis not present

## 2023-07-02 LAB — RESPIRATORY CULTURE OR RESPIRATORY AND SPUTUM CULTURE: MICRO NUMBER:: 16250469

## 2023-07-27 DIAGNOSIS — G4700 Insomnia, unspecified: Secondary | ICD-10-CM | POA: Diagnosis not present

## 2023-07-27 DIAGNOSIS — K7460 Unspecified cirrhosis of liver: Secondary | ICD-10-CM | POA: Diagnosis not present

## 2023-07-27 DIAGNOSIS — R7303 Prediabetes: Secondary | ICD-10-CM | POA: Diagnosis not present

## 2023-07-27 DIAGNOSIS — K7030 Alcoholic cirrhosis of liver without ascites: Secondary | ICD-10-CM | POA: Diagnosis not present

## 2023-07-27 DIAGNOSIS — E7800 Pure hypercholesterolemia, unspecified: Secondary | ICD-10-CM | POA: Diagnosis not present

## 2023-07-29 ENCOUNTER — Ambulatory Visit (INDEPENDENT_AMBULATORY_CARE_PROVIDER_SITE_OTHER): Payer: Medicare Other

## 2023-07-29 VITALS — BP 123/76 | HR 61 | Temp 98.3°F | Resp 16 | Ht 66.0 in | Wt 167.0 lb

## 2023-07-29 DIAGNOSIS — I700 Atherosclerosis of aorta: Secondary | ICD-10-CM | POA: Diagnosis not present

## 2023-07-29 MED ORDER — INCLISIRAN SODIUM 284 MG/1.5ML ~~LOC~~ SOSY
284.0000 mg | PREFILLED_SYRINGE | Freq: Once | SUBCUTANEOUS | Status: AC
  Administered 2023-07-29: 284 mg via SUBCUTANEOUS
  Filled 2023-07-29: qty 1.5

## 2023-07-29 NOTE — Progress Notes (Signed)
 Diagnosis: Hyperlipidemia  Provider:  Mannam, Praveen MD  Procedure: Injection  Leqvio  (inclisiran), Dose: 284 mg, Site: subcutaneous, Number of injections: 1  Injection Site(s): Left arm   Discharge: Condition: Good, Destination: Home . AVS Provided  Performed by:  Shirly Dow, RN

## 2023-08-04 DIAGNOSIS — M9902 Segmental and somatic dysfunction of thoracic region: Secondary | ICD-10-CM | POA: Diagnosis not present

## 2023-08-04 DIAGNOSIS — M5459 Other low back pain: Secondary | ICD-10-CM | POA: Diagnosis not present

## 2023-08-04 DIAGNOSIS — G4700 Insomnia, unspecified: Secondary | ICD-10-CM | POA: Diagnosis not present

## 2023-08-04 DIAGNOSIS — G629 Polyneuropathy, unspecified: Secondary | ICD-10-CM | POA: Diagnosis not present

## 2023-08-04 DIAGNOSIS — M9905 Segmental and somatic dysfunction of pelvic region: Secondary | ICD-10-CM | POA: Diagnosis not present

## 2023-08-04 DIAGNOSIS — M9903 Segmental and somatic dysfunction of lumbar region: Secondary | ICD-10-CM | POA: Diagnosis not present

## 2023-08-04 DIAGNOSIS — M9906 Segmental and somatic dysfunction of lower extremity: Secondary | ICD-10-CM | POA: Diagnosis not present

## 2023-08-04 DIAGNOSIS — M546 Pain in thoracic spine: Secondary | ICD-10-CM | POA: Diagnosis not present

## 2023-08-04 DIAGNOSIS — M9904 Segmental and somatic dysfunction of sacral region: Secondary | ICD-10-CM | POA: Diagnosis not present

## 2023-08-04 DIAGNOSIS — M9908 Segmental and somatic dysfunction of rib cage: Secondary | ICD-10-CM | POA: Diagnosis not present

## 2023-08-10 DIAGNOSIS — H40013 Open angle with borderline findings, low risk, bilateral: Secondary | ICD-10-CM | POA: Diagnosis not present

## 2023-08-10 DIAGNOSIS — Z961 Presence of intraocular lens: Secondary | ICD-10-CM | POA: Diagnosis not present

## 2023-08-10 DIAGNOSIS — H4323 Crystalline deposits in vitreous body, bilateral: Secondary | ICD-10-CM | POA: Diagnosis not present

## 2023-08-10 DIAGNOSIS — H35373 Puckering of macula, bilateral: Secondary | ICD-10-CM | POA: Diagnosis not present

## 2023-09-02 ENCOUNTER — Other Ambulatory Visit: Payer: Self-pay | Admitting: Family Medicine

## 2023-09-02 DIAGNOSIS — Z1231 Encounter for screening mammogram for malignant neoplasm of breast: Secondary | ICD-10-CM

## 2023-09-15 DIAGNOSIS — M791 Myalgia, unspecified site: Secondary | ICD-10-CM | POA: Diagnosis not present

## 2023-09-15 DIAGNOSIS — G4700 Insomnia, unspecified: Secondary | ICD-10-CM | POA: Diagnosis not present

## 2023-09-15 DIAGNOSIS — M9908 Segmental and somatic dysfunction of rib cage: Secondary | ICD-10-CM | POA: Diagnosis not present

## 2023-09-15 DIAGNOSIS — M19071 Primary osteoarthritis, right ankle and foot: Secondary | ICD-10-CM | POA: Diagnosis not present

## 2023-09-15 DIAGNOSIS — M9903 Segmental and somatic dysfunction of lumbar region: Secondary | ICD-10-CM | POA: Diagnosis not present

## 2023-09-15 DIAGNOSIS — E46 Unspecified protein-calorie malnutrition: Secondary | ICD-10-CM | POA: Diagnosis not present

## 2023-09-15 DIAGNOSIS — M25552 Pain in left hip: Secondary | ICD-10-CM | POA: Diagnosis not present

## 2023-09-15 DIAGNOSIS — M25551 Pain in right hip: Secondary | ICD-10-CM | POA: Diagnosis not present

## 2023-09-15 DIAGNOSIS — M9904 Segmental and somatic dysfunction of sacral region: Secondary | ICD-10-CM | POA: Diagnosis not present

## 2023-09-15 DIAGNOSIS — M9905 Segmental and somatic dysfunction of pelvic region: Secondary | ICD-10-CM | POA: Diagnosis not present

## 2023-09-15 DIAGNOSIS — M545 Low back pain, unspecified: Secondary | ICD-10-CM | POA: Diagnosis not present

## 2023-09-15 DIAGNOSIS — M9902 Segmental and somatic dysfunction of thoracic region: Secondary | ICD-10-CM | POA: Diagnosis not present

## 2023-09-23 ENCOUNTER — Ambulatory Visit
Admission: RE | Admit: 2023-09-23 | Discharge: 2023-09-23 | Disposition: A | Discharge status: Home / Self Care | Source: Ambulatory Visit | Attending: Family Medicine | Admitting: Family Medicine

## 2023-09-23 DIAGNOSIS — Z1231 Encounter for screening mammogram for malignant neoplasm of breast: Secondary | ICD-10-CM

## 2023-10-20 DIAGNOSIS — K7460 Unspecified cirrhosis of liver: Secondary | ICD-10-CM | POA: Diagnosis not present

## 2023-10-20 DIAGNOSIS — Z860100 Personal history of colon polyps, unspecified: Secondary | ICD-10-CM | POA: Diagnosis not present

## 2023-10-27 DIAGNOSIS — M47816 Spondylosis without myelopathy or radiculopathy, lumbar region: Secondary | ICD-10-CM | POA: Diagnosis not present

## 2023-11-24 DIAGNOSIS — Z7189 Other specified counseling: Secondary | ICD-10-CM | POA: Diagnosis not present

## 2023-12-02 DIAGNOSIS — K7460 Unspecified cirrhosis of liver: Secondary | ICD-10-CM | POA: Diagnosis not present

## 2023-12-02 DIAGNOSIS — R7303 Prediabetes: Secondary | ICD-10-CM | POA: Diagnosis not present

## 2023-12-02 DIAGNOSIS — N183 Chronic kidney disease, stage 3 unspecified: Secondary | ICD-10-CM | POA: Diagnosis not present

## 2023-12-02 DIAGNOSIS — E7800 Pure hypercholesterolemia, unspecified: Secondary | ICD-10-CM | POA: Diagnosis not present

## 2023-12-04 DIAGNOSIS — K7460 Unspecified cirrhosis of liver: Secondary | ICD-10-CM | POA: Diagnosis not present

## 2023-12-04 DIAGNOSIS — K7689 Other specified diseases of liver: Secondary | ICD-10-CM | POA: Diagnosis not present

## 2023-12-04 DIAGNOSIS — K8020 Calculus of gallbladder without cholecystitis without obstruction: Secondary | ICD-10-CM | POA: Diagnosis not present

## 2023-12-09 DIAGNOSIS — N183 Chronic kidney disease, stage 3 unspecified: Secondary | ICD-10-CM | POA: Diagnosis not present

## 2023-12-09 DIAGNOSIS — G4700 Insomnia, unspecified: Secondary | ICD-10-CM | POA: Diagnosis not present

## 2023-12-09 DIAGNOSIS — I779 Disorder of arteries and arterioles, unspecified: Secondary | ICD-10-CM | POA: Diagnosis not present

## 2023-12-09 DIAGNOSIS — E7800 Pure hypercholesterolemia, unspecified: Secondary | ICD-10-CM | POA: Diagnosis not present

## 2023-12-09 DIAGNOSIS — K7460 Unspecified cirrhosis of liver: Secondary | ICD-10-CM | POA: Diagnosis not present

## 2023-12-09 DIAGNOSIS — I341 Nonrheumatic mitral (valve) prolapse: Secondary | ICD-10-CM | POA: Diagnosis not present

## 2023-12-09 DIAGNOSIS — R7303 Prediabetes: Secondary | ICD-10-CM | POA: Diagnosis not present

## 2023-12-16 DIAGNOSIS — Z23 Encounter for immunization: Secondary | ICD-10-CM | POA: Diagnosis not present

## 2024-01-19 ENCOUNTER — Encounter: Payer: Self-pay | Admitting: *Deleted

## 2024-01-20 ENCOUNTER — Ambulatory Visit: Payer: Self-pay | Admitting: Nurse Practitioner

## 2024-01-20 ENCOUNTER — Ambulatory Visit: Admitting: Emergency Medicine

## 2024-01-20 ENCOUNTER — Ambulatory Visit (INDEPENDENT_AMBULATORY_CARE_PROVIDER_SITE_OTHER)

## 2024-01-20 ENCOUNTER — Encounter: Payer: Self-pay | Admitting: Nurse Practitioner

## 2024-01-20 ENCOUNTER — Ambulatory Visit (INDEPENDENT_AMBULATORY_CARE_PROVIDER_SITE_OTHER): Admitting: Nurse Practitioner

## 2024-01-20 ENCOUNTER — Ambulatory Visit: Admitting: Physician Assistant

## 2024-01-20 ENCOUNTER — Ambulatory Visit: Payer: Self-pay | Admitting: Family Medicine

## 2024-01-20 VITALS — BP 143/81 | HR 74 | Temp 97.9°F | Ht 66.0 in | Wt 166.0 lb

## 2024-01-20 DIAGNOSIS — J4531 Mild persistent asthma with (acute) exacerbation: Secondary | ICD-10-CM

## 2024-01-20 DIAGNOSIS — J4530 Mild persistent asthma, uncomplicated: Secondary | ICD-10-CM

## 2024-01-20 DIAGNOSIS — J209 Acute bronchitis, unspecified: Secondary | ICD-10-CM | POA: Diagnosis not present

## 2024-01-20 DIAGNOSIS — R058 Other specified cough: Secondary | ICD-10-CM | POA: Diagnosis not present

## 2024-01-20 DIAGNOSIS — I700 Atherosclerosis of aorta: Secondary | ICD-10-CM | POA: Diagnosis not present

## 2024-01-20 LAB — CBC WITH DIFFERENTIAL/PLATELET
Basophils Absolute: 0 K/uL (ref 0.0–0.1)
Basophils Relative: 0.7 % (ref 0.0–3.0)
Eosinophils Absolute: 0.2 K/uL (ref 0.0–0.7)
Eosinophils Relative: 4.8 % (ref 0.0–5.0)
HCT: 38.2 % (ref 36.0–46.0)
Hemoglobin: 13.2 g/dL (ref 12.0–15.0)
Lymphocytes Relative: 30.7 % (ref 12.0–46.0)
Lymphs Abs: 1.4 K/uL (ref 0.7–4.0)
MCHC: 34.5 g/dL (ref 30.0–36.0)
MCV: 87.3 fl (ref 78.0–100.0)
Monocytes Absolute: 0.6 K/uL (ref 0.1–1.0)
Monocytes Relative: 12.7 % — ABNORMAL HIGH (ref 3.0–12.0)
Neutro Abs: 2.3 K/uL (ref 1.4–7.7)
Neutrophils Relative %: 51.1 % (ref 43.0–77.0)
Platelets: 235 K/uL (ref 150.0–400.0)
RBC: 4.37 Mil/uL (ref 3.87–5.11)
RDW: 13.2 % (ref 11.5–15.5)
WBC: 4.4 K/uL (ref 4.0–10.5)

## 2024-01-20 MED ORDER — IPRATROPIUM-ALBUTEROL 0.5-2.5 (3) MG/3ML IN SOLN
3.0000 mL | Freq: Once | RESPIRATORY_TRACT | Status: AC
  Administered 2024-01-20: 3 mL via RESPIRATORY_TRACT

## 2024-01-20 MED ORDER — PREDNISONE 20 MG PO TABS: 40.0000 mg | ORAL_TABLET | Freq: Every day | ORAL | 0 refills | Status: AC

## 2024-01-20 MED ORDER — METHYLPREDNISOLONE ACETATE 80 MG/ML IJ SUSP
80.0000 mg | Freq: Once | INTRAMUSCULAR | Status: AC
  Administered 2024-01-20: 80 mg via INTRAMUSCULAR

## 2024-01-20 MED ORDER — AZITHROMYCIN 250 MG PO TABS: ORAL_TABLET | ORAL | 0 refills | Status: DC

## 2024-01-20 MED ORDER — BENZONATATE 200 MG PO CAPS: 200.0000 mg | ORAL_CAPSULE | Freq: Three times a day (TID) | ORAL | 1 refills | Status: AC | PRN

## 2024-01-20 MED ORDER — PROMETHAZINE-DM 6.25-15 MG/5ML PO SYRP: 5.0000 mL | ORAL_SOLUTION | Freq: Four times a day (QID) | ORAL | 0 refills | Status: DC | PRN

## 2024-01-20 NOTE — Progress Notes (Signed)
 @Patient  ID: Nicole Barr, female    DOB: [birth year], 78 y.o.   MRN: [id number]  Chief Complaint  Patient presents with   Acute Visit    Pt states she inhaled something bacterial on Saturday. Pt is having a productive cough with yellow-ish phlegm. Pt denies any SOB    Referring provider: Leonel Cole, MD  HPI: 78 year old female, some day smoker followed for asthma. She is a patient of [name] and last seen in office  . Past medical history significant for CAD, mitral valve regurgitation, cirrhosis, hx of ETOH abuse.   TEST/EVENTS:  02/11/2022 CXR: RML infiltrate  07/05/2020 echo: EF 70-75%, LVH. RV size and function nl. Normal RVSP. LA and RA mildly dilated. Trivial MR.  07/02/2022 PFT: FVC 101, FEV1 104, ratio 77, TLC 117, DLCOcor 80. No BD  06/30/2023: OV with Dr. Kara. Persistent cough with mucus production for approx 2 months. Initial tx with doxycycline ineffective. Subsequent course of augmentin with some improvement after 5 days but not full resolved. Difficulty with breathing. Albuterol not helpful. Adverse reaction to Advair with swelling and airway closure. Still smoking. Prescribed Incruse. Sputum culture ordered. Smoking cessation.   01/20/2024: Today - acute Discussed the use of AI scribe software for clinical note transcription with the patient, who gave verbal consent to proceed.  History of Present Illness Nicole Barr is a 78 year old female who presents for acute visit.   An episode began last Saturday when she thinks she may have inhaled something at the gym, she's not sure what, and woke up Sunday with a scratchy throat. She gargled with salt water, but symptoms worsened with increased mucus production and chest tightness. She experiences constant coughing and has not slept very well for two days because of it. The mucus is described as milky white. She took Robitussin DM with guaifenesin to help expel mucus. No fevers, chills, hemoptysis, CP,  leg swelling. Does feel like her sinuses are draining some but no colored mucus. No ear pain, headaches, or body aches. No known sick exposures.   She has a history of adverse reactions to steroids, specifically prednisone, which she believes increased her mucus production. She also reports a poor response to doxycycline in the past.   She smokes when frustrated but has not smoked for four weeks. She does not really use her albuterol. Not on any maintenance inhaler due to prior side effects. She is very sensitive to medications, per her report.     Allergies  Allergen Reactions   Advair Hfa [Fluticasone -Salmeterol] Anaphylaxis   Carvedilol Other (See Comments)    Hypertension (intolerance), Rapid Heart Rate Increases BP to 300 - per pt    Doxepin Hcl     Other reaction(s): does not do anything for her   Doxycycline Other (See Comments)    Not specified   Trazodone Hcl     Other reaction(s): in effective for sleep (2016)    Immunization History  Administered Date(s) Administered   PFIZER(Purple Top)SARS-COV-2 Vaccination 12/06/2019   Pfizer Covid-19 Vaccine Bivalent Booster 48yrs & up 12/27/2020    Past Medical History:  Diagnosis Date   Alcoholic cirrhosis (HCC)    Ascites    Coronary artery disease    Endometriosis    GERD (gastroesophageal reflux disease)    Hypertension    Liver cyst     Tobacco History: Social History   Tobacco Use  Smoking Status Some Days   Current packs/day: 0.50   Average packs/day:  0.5 packs/day for 55.8 years (27.9 ttl pk-yrs)   Types: Cigarettes   Start date: 04/17/1968  Smokeless Tobacco Never  Tobacco Comments   HAS NOT SMOKED FOR 4 WEEKS 01/20/24   Ready to quit: Not Answered Counseling given: Not Answered Tobacco comments: HAS NOT SMOKED FOR 4 WEEKS 01/20/24   Outpatient Medications Prior to Visit  Medication Sig Dispense Refill   albuterol (VENTOLIN HFA) 108 (90 Base) MCG/ACT inhaler Inhale 1-2 puffs into the lungs every 6  (six) hours as needed for wheezing or shortness of breath.     Cholecalciferol 25 MCG (1000 UT) tablet Take 1,000 Units by mouth See admin instructions. Every other day     furosemide  (LASIX ) 20 MG tablet Take 20 mg by mouth daily.     lactulose  (CHRONULAC ) 10 GM/15ML solution 3-4 tablespoons daily by mouth     nadolol  (CORGARD ) 20 MG tablet Take 1 tablet (20 mg total) by mouth daily. 90 tablet 3   rifaximin  (XIFAXAN ) 550 MG TABS tablet Take 550 mg by mouth 2 (two) times daily.     spironolactone  (ALDACTONE ) 50 MG tablet Take 50 mg by mouth every morning.     gabapentin  (NEURONTIN ) 100 MG capsule Take 1 capsule (100 mg total) by mouth at bedtime. 90 capsule 0   inclisiran (LEQVIO ) 284 MG/1.5ML SOSY injection Inject 284 mg into the skin every 6 (six) months.     benzonatate  (TESSALON ) 100 MG capsule Take 1 capsule (100 mg total) by mouth every 8 (eight) hours. (Patient not taking: Reported on 01/20/2024) 21 capsule 0   umeclidinium bromide  (INCRUSE ELLIPTA ) 62.5 MCG/ACT AEPB Inhale 1 puff into the lungs daily. 30 each 2   No facility-administered medications prior to visit.     Review of Systems: as above    Physical Exam:  BP (!) 143/81   Pulse 74   Temp 97.9 F (36.6 C)   Ht 5' 6 (1.676 m)   Wt 166 lb (75.3 kg)   SpO2 95% Comment: RA  BMI 26.79 kg/m   GEN: Pleasant, interactive, acutely-ill appearing; in no acute distress. HEENT:  Normocephalic and atraumatic. PERRLA. Sclera white. Nasal turbinates pink, moist and patent bilaterally. No rhinorrhea present. Oropharynx pink and moist, without exudate or edema. No lesions, ulcerations, or postnasal drip.  NECK:  Supple w/ fair ROM. No JVD present. Cervical lymphadenopathy.   CV: RRR, no m/r/g, no peripheral edema. Pulses intact, +2 bilaterally. No cyanosis, pallor or clubbing. PULMONARY:  Unlabored, regular breathing. Scattered wheezes bilaterally A&P. Bronchitic cough. No accessory muscle use.  GI: BS present and normoactive.  Soft, non-tender to palpation. MSK: No erythema, warmth or tenderness. Cap refil <2 sec all extrem.  Neuro: A/Ox3. No focal deficits noted.   Skin: Warm, no lesions or rashe Psych: Normal affect and behavior. Judgement and thought content appropriate.     Lab Results:  CBC    Component Value Date/Time   WBC 5.2 06/06/2023 0556   RBC 4.41 06/06/2023 0556   HGB 13.2 06/06/2023 0556   HGB 13.0 05/13/2019 0923   HCT 39.6 06/06/2023 0556   HCT 37.8 05/13/2019 0923   PLT 256 06/06/2023 0556   PLT 227 05/13/2019 0923   MCV 89.8 06/06/2023 0556   MCV 90 05/13/2019 0923   MCH 29.9 06/06/2023 0556   MCHC 33.3 06/06/2023 0556   RDW 13.2 06/06/2023 0556   RDW 12.1 05/13/2019 0923   LYMPHSABS 1.5 06/06/2023 0556   LYMPHSABS 1.4 04/21/2018 0734   MONOABS 0.5 06/06/2023 0556  EOSABS 0.3 06/06/2023 0556   EOSABS 0.2 04/21/2018 0734   BASOSABS 0.0 06/06/2023 0556   BASOSABS 0.0 04/21/2018 0734    BMET    Component Value Date/Time   NA 138 06/06/2023 0556   NA 141 05/13/2019 0923   K 3.7 06/06/2023 0556   CL 103 06/06/2023 0556   CO2 25 06/06/2023 0556   GLUCOSE 120 (H) 06/06/2023 0556   BUN 16 06/06/2023 0556   BUN 22 05/13/2019 0923   CREATININE 0.87 06/06/2023 0556   CREATININE 0.99 10/29/2015 0820   CALCIUM 9.6 06/06/2023 0556   GFRNONAA >60 06/06/2023 0556   GFRAA 43 (L) 05/13/2019 0923    BNP No results found for: BNP   Imaging:  No results found.  ipratropium-albuterol (DUONEB) 0.5-2.5 (3) MG/3ML nebulizer solution 3 mL     Date Action Dose Route User   01/20/2024 1425 Given 3 mL Nebulization Heater, Megan S, CMA      methylPREDNISolone  acetate (DEPO-MEDROL ) injection 80 mg     Date Action Dose Route User   01/20/2024 1515 Given 80 mg Intramuscular (Left Upper Outer Quadrant) Heater, Megan S, CMA          Latest Ref Rng & Units 07/02/2022    8:55 AM  PFT Results  FVC-Pre L 3.16   FVC-Predicted Pre % 101   FVC-Post L 3.07   FVC-Predicted Post  % 98   Pre FEV1/FVC % % 78   Post FEV1/FCV % % 77   FEV1-Pre L 2.47   FEV1-Predicted Pre % 104   FEV1-Post L 2.36   DLCO uncorrected ml/min/mmHg 16.72   DLCO UNC% % 80   DLCO corrected ml/min/mmHg 16.72   DLCO COR %Predicted % 80   DLVA Predicted % 77   TLC L 6.38   TLC % Predicted % 117   RV % Predicted % 131     No results found for: NITRICOXIDE      Assessment & Plan:   Acute bronchitis Acute bronchitis with significant bronchospasm on exam. COVID and flu testing negative in office. Discussed necessity of steroid course. Prior response is not a side effect of prednisone, which we reviewed. Verbalized understanding and willing to retrial. Treated with duoneb x 1 and depo 80 mg inj x 1 in office today. Start prednisone burst and empiric azithromycin. Supportive care and cough control measures. Reviewed possibility of alternative viral illness. Strict return/ED precautions. VS stable and non-toxic appearing. Able to speak in complete sentences without difficulties. Action plan in place.  Patient Instructions  Continue Albuterol inhaler 2 puffs every 6 hours as needed for shortness of breath or wheezing. Notify if symptoms persist despite rescue inhaler/neb use.   -Prednisone 40 mg daily for 5 days. Take in AM with food. Start tomorrow  -Azithromycin - take 2 tabs on day one then 1 tab daily for four additional days  -Saline nasal spray 2-3 times a day as needed for nasal congestion/postnasal drip until symptoms improve -Mucinex 600 mg twice daily over the counter as needed until symptoms improve -Chlortab 4mg  over the counter at night as needed for cough -Promethazine  DM cough syrup 5 mL every 6 hours as needed for cough. May cause drowsiness. Do not drive after taking  -Benzonatate  1 capsule Three times a day for cough. Use consistently over the next few days then as needed for cough   Chest x ray today  Labs today   Follow up in 2-3 weeks with Dr. Kara or Izetta Malachy PIETY.  If  symptoms do not improve or worsen, please contact office for sooner follow up or seek emergency care.    Reactive airway disease See above. Intolerant of ICS/LABA and LAMA therapy in past. Did well with duoneb in office. Advised to continue PRN SABA at home. Can consider duonebs at home, pending response. Recommend allergy testing to assess for environmental triggers, as respiratory symptoms developed after her move to [place].     I spent 35 minutes of dedicated to the care of this patient on the date of this encounter to include pre-visit review of records, face-to-face time with the patient discussing conditions above, post visit ordering of testing, clinical documentation with the electronic health record, making appropriate referrals as documented, and communicating necessary findings to members of the patients care team.  Comer LULLA Rouleau, NP 01/20/2024  Pt aware and understands NP's role.

## 2024-01-20 NOTE — Assessment & Plan Note (Addendum)
 Acute bronchitis with significant bronchospasm on exam. COVID and flu testing negative in office. Discussed necessity of steroid course. Prior response is not a side effect of prednisone, which we reviewed. Verbalized understanding and willing to retrial. Treated with duoneb x 1 and depo 80 mg inj x 1 in office today. Start prednisone burst and empiric azithromycin. Supportive care and cough control measures. Reviewed possibility of alternative viral illness. Strict return/ED precautions. VS stable and non-toxic appearing. Able to speak in complete sentences without difficulties. Action plan in place.  Patient Instructions  Continue Albuterol inhaler 2 puffs every 6 hours as needed for shortness of breath or wheezing. Notify if symptoms persist despite rescue inhaler/neb use.   -Prednisone 40 mg daily for 5 days. Take in AM with food. Start tomorrow  -Azithromycin - take 2 tabs on day one then 1 tab daily for four additional days  -Saline nasal spray 2-3 times a day as needed for nasal congestion/postnasal drip until symptoms improve -Mucinex 600 mg twice daily over the counter as needed until symptoms improve -Chlortab 4mg  over the counter at night as needed for cough -Promethazine  DM cough syrup 5 mL every 6 hours as needed for cough. May cause drowsiness. Do not drive after taking  -Benzonatate  1 capsule Three times a day for cough. Use consistently over the next few days then as needed for cough   Chest x ray today  Labs today   Follow up in 2-3 weeks with Dr. Kara or Nicole Barr. If symptoms do not improve or worsen, please contact office for sooner follow up or seek emergency care.

## 2024-01-20 NOTE — Assessment & Plan Note (Signed)
 See above. Intolerant of ICS/LABA and LAMA therapy in past. Did well with duoneb in office. Advised to continue PRN SABA at home. Can consider duonebs at home, pending response. Recommend allergy testing to assess for environmental triggers, as respiratory symptoms developed after her move to [place].

## 2024-01-20 NOTE — Telephone Encounter (Signed)
 FYI Only or Action Required?: FYI only for provider.  Patient is followed in Pulmonology for asthma, last seen on 06/30/2023 by Kara Dorn NOVAK, MD.  Called Nurse Triage reporting Cough.  Symptoms began several days ago.  Triage Disposition: See HCP Within 4 Hours (Or PCP Triage)  Patient/caregiver understands and will follow disposition?: Yes    OXYGEN: "Do you wear supplemental oxygen?" No If yes, "How many liters are you supposed to use?" N/A  "Do you monitor your oxygen levels?" No      Copied from CRM #[id number]. Topic: Clinical - Red Word Triage >> Jan 20, 2024  8:46 AM Russell PARAS wrote: Red Word that prompted transfer to Nurse Triage:   Feels she may have breathed in something that was an irritant this weekend Coughing constantly Heaving out phlegm Irritated nasal passages Feels she may have inhaled a form of bacteria Was seen by Cardiology and spoke with their PharmD who prescribed Leqvio , has been causing breathing problems  Pt of [place] Reason for Disposition  [1] MILD difficulty breathing (e.g., minimal/no SOB at rest, SOB with walking, pulse < 100) AND [2] still present when not coughing  Answer Assessment - Initial Assessment Questions Pt wants a sputum test done. Pt mentions the possibility of her having NTM, RSV, or breathing in a particle of some sort. Pt thinks it is just a bacterial infection. This RN scheduled pt an appointment today in office. This RN educated pt on new-worsening symptoms and when to call back/seek emergent care. Pt verbalized understanding and agrees to plan.    Pt states she started taking Leqvio  two years ago which is when she started having breathing difficulties Pt states after this is when she started seeing Dr. Kara Pt states Dr. 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      Sat or Sun  SPUTUM: Describe the color of your sputum (e.g., none, dry cough; clear, white, yellow, green)     Milky white  HEMOPTYSIS: Are you coughing up any blood? If Yes, ask: How much? (e.g., flecks, streaks, tablespoons, etc.)     Not a lot just a pale strand of blood  FEVER: Do you have a fever? If Yes, ask: What is your temperature, how was it measured, and when did it start?     No  Denies chest pain  Protocols used: Cough - Acute Productive-A-AH

## 2024-01-20 NOTE — Patient Instructions (Addendum)
 Continue Albuterol inhaler 2 puffs every 6 hours as needed for shortness of breath or wheezing. Notify if symptoms persist despite rescue inhaler/neb use.   -Prednisone 40 mg daily for 5 days. Take in AM with food. Start tomorrow  -Azithromycin - take 2 tabs on day one then 1 tab daily for four additional days  -Saline nasal spray 2-3 times a day as needed for nasal congestion/postnasal drip until symptoms improve -Mucinex 600 mg twice daily over the counter as needed until symptoms improve -Chlortab 4mg  over the counter at night as needed for cough -Promethazine  DM cough syrup 5 mL every 6 hours as needed for cough. May cause drowsiness. Do not drive after taking  -Benzonatate  1 capsule Three times a day for cough. Use consistently over the next few days then as needed for cough   Chest x ray today  Labs today   Follow up in 2-3 weeks with Dr. Kara or Izetta Malachy PIETY. If symptoms do not improve or worsen, please contact office for sooner follow up or seek emergency care.

## 2024-01-23 LAB — ALLERGEN PANEL (27) + IGE
Alternaria Alternata IgE: <0.1 kU/L
Aspergillus Fumigatus IgE: <0.1 kU/L
Bahia Grass IgE: <0.1 kU/L
Bermuda Grass IgE: <0.1 kU/L
Cat Dander IgE: <0.1 kU/L
Cedar, Mountain IgE: <0.1 kU/L
Cladosporium Herbarum IgE: <0.1 kU/L
Cocklebur IgE: <0.1 kU/L
Cockroach, American IgE: <0.1 kU/L
Common Silver Birch IgE: <0.1 kU/L
D Farinae IgE: <0.1 kU/L
D Pteronyssinus IgE: <0.1 kU/L
Dog Dander IgE: <0.1 kU/L
Elm, American IgE: <0.1 kU/L
Hickory, White IgE: <0.1 kU/L
IgE (Immunoglobulin E), Serum: 19 [IU]/mL (ref 6–495)
Johnson Grass IgE: <0.1 kU/L
Kentucky Bluegrass IgE: <0.1 kU/L
Maple/Box Elder IgE: <0.1 kU/L
Mucor Racemosus IgE: <0.1 kU/L
Oak, White IgE: <0.1 kU/L
Penicillium Chrysogen IgE: <0.1 kU/L
Pigweed, Rough IgE: <0.1 kU/L
Plantain, English IgE: <0.1 kU/L
Ragweed, Short IgE: <0.1 kU/L
Setomelanomma Rostrat: <0.1 kU/L
Timothy Grass IgE: <0.1 kU/L
White Mulberry IgE: <0.1 kU/L

## 2024-01-25 DIAGNOSIS — L820 Inflamed seborrheic keratosis: Secondary | ICD-10-CM | POA: Diagnosis not present

## 2024-01-25 DIAGNOSIS — L2989 Other pruritus: Secondary | ICD-10-CM | POA: Diagnosis not present

## 2024-01-25 DIAGNOSIS — L538 Other specified erythematous conditions: Secondary | ICD-10-CM | POA: Diagnosis not present

## 2024-01-25 DIAGNOSIS — Z789 Other specified health status: Secondary | ICD-10-CM | POA: Diagnosis not present

## 2024-01-26 ENCOUNTER — Other Ambulatory Visit: Payer: Self-pay | Admitting: Nurse Practitioner

## 2024-01-26 DIAGNOSIS — J209 Acute bronchitis, unspecified: Secondary | ICD-10-CM

## 2024-01-26 MED ORDER — PROMETHAZINE-DM 6.25-15 MG/5ML PO SYRP: 5.0000 mL | ORAL_SOLUTION | Freq: Four times a day (QID) | ORAL | 0 refills | Status: AC | PRN

## 2024-01-26 MED ORDER — CEFDINIR 300 MG PO CAPS: 300.0000 mg | ORAL_CAPSULE | Freq: Two times a day (BID) | ORAL | 0 refills | Status: AC

## 2024-01-26 MED ORDER — PREDNISONE 10 MG PO TABS
ORAL_TABLET | ORAL | 0 refills | Status: AC
Start: 2024-01-26 — End: ?

## 2024-01-26 NOTE — Telephone Encounter (Signed)
 Recommend alternative abx if still producing dark phlegm. I sent in cefdinir Twice daily for 7 days. Take with food. Will extend her prednisone and taper down - 30 mg for 2 days, 20 mg for 2 days, 10 mg for 2 days then stop. Refilled cough rx. Thanks.

## 2024-01-26 NOTE — Telephone Encounter (Unsigned)
 Copied from CRM [id number]. Topic: Clinical - Medication Refill >> Jan 26, 2024 10:17 AM Ismael A wrote: Medication: azithromycin (ZITHROMAX) 250 MG tablet  promethazine -dextromethorphan (PROMETHAZINE -DM) 6.25-15 MG/5ML syrup   predniSONE (DELTASONE) 20 MG table ( I did advise that this was ended)   Has the patient contacted their pharmacy? No (Agent: If no, request that the patient contact the pharmacy for the refill. If patient does not wish to contact the pharmacy document the reason why and proceed with request.) (Agent: If yes, when and what did the pharmacy advise?)  This is the patient's preferred pharmacy:  [id number] - [place], [place] - [address] AT [address] OF [address] [address]  [place] [address] Phone: [phone] Fax: [phone]   Is this the correct pharmacy for this prescription? Yes If no, delete pharmacy and type the correct one.   Has the prescription been filled recently? Yes  Is the patient out of the medication? Yes - patient still feels phlegm and rattling so she would like another dose to get rid of infection, but she does feel better  Has the patient been seen for an appointment in the last year OR does the patient have an upcoming appointment? Yes  Can we respond through MyChart? Yes  Agent: Please be advised that Rx refills may take up to 3 business days. We ask that you follow-up with your pharmacy.

## 2024-01-26 NOTE — Progress Notes (Unsigned)
 Cardiology Office Note   Date:  01/27/2024  ID:  Nicole, Barr [birth year], MRN [id number] PCP: Leonel Cole, MD  [facility] Providers Cardiologist:  Stanly DELENA Leavens, MD   History of Present Illness Nicole Barr is a 78 y.o. female with a past medical history of nonobstructive CAD and carotid artery disease status post CEA, alcoholic cirrhosis on transplant list at [facility] with varices here for follow-up appointment.  Was under Dr. Ceola care from 20 16-20 22 and found to have a moderate pLAD disease, no statin with cirrhosis.  Had minimal CAS plaque and was advised for 1 year follow-up back in 2021.  Aortic atherosclerosis.  In 2022 no issues and was going to the gym.  In 2023 preop evaluation was completed and aspirin was discussed, found to have colorectal polyp which was removed.  Saw Dr. Teresa.  Patient noted she was doing okay without any chest pressure pain last year when she was seen.  No shortness of breath or DOE.  No PND/orthopnea.  No weight gain or leg swelling.  At the gym her heart rate was going fast and she was also having some palpitations when she was vacuuming her house.  Has anxiety and has a very rare cigarette.  Today, she presents with concerns about her current medication, Leqvio .  She experiences breathing problems that began three to four months after starting Leqvio , which was switched from Praluent . These symptoms have persisted for about a year, leading her to consider canceling her upcoming Leqvio  shot.  She is concerned about her heart health, noting weight gain despite regular gym attendance and a restricted diet. An echocardiogram in 2022 indicated a mitral valve leak, which she questions as a potential contributor to her symptoms.  Her family history includes genetic predispositions to plaque buildup, attributed to her father's side. She maintains a healthy lifestyle, avoiding alcohol and keeping a low-sugar diet, but  struggles with weight management  Reports no shortness of breath nor dyspnea on exertion. Reports no chest pain, pressure, or tightness. No edema, orthopnea, PND. Reports no palpitations.   Discussed the use of AI scribe software for clinical note transcription with the patient, who gave verbal consent to proceed.   ROS: Pertinent ROS in HPI  Studies Reviewed EKG Interpretation Date/Time:  Wednesday January 27 2024 13:32:06 EDT Ventricular Rate:  56 PR Interval:  164 QRS Duration:  84 QT Interval:  412 QTC Calculation: 397 R Axis:   42  Text Interpretation: Sinus bradycardia No previous ECGs available Confirmed by Lucien Blanc [phone]) on 01/27/2024 1:38:07 PM    STRESS TESTS   ECHOCARDIOGRAM STRESS TEST 10/12/2014   ECHOCARDIOGRAM   ECHOCARDIOGRAM COMPLETE 07/05/2020   Narrative ECHOCARDIOGRAM REPORT       Patient Name:   Nicole Barr Date of Exam: 07/05/2020 Medical Rec #:  [id number]            Height:       66.0 in Accession #:    [id number]           Weight:       167.0 lb Date of Birth:  Sep 04, 1945           BSA:          1.852 m Patient Age:    74 years             BP:           112/60 mmHg Patient Gender: F  HR:           66 bpm. Exam Location:  [address]   Procedure: 2D Echo, 3D Echo, Cardiac Doppler and Color Doppler   Indications:    I34.0 Mitral Valve Regurgitation   History:        Patient has prior history of Echocardiogram examinations, most recent 07/26/2014. CAD; Risk Factors:Family History of Coronary Artery Disease, Dyslipidemia and Former Smoker. History of ETOH Abuse with Alcoholic Cirrhosis, and Chronic Kidney Disease.   Sonographer:    Heather Hawks RDCS Referring Phys: [id number] [facility] A CHANDRASEKHAR   IMPRESSIONS     1. Left ventricular ejection fraction, by estimation, is 70 to 75%. The left ventricle has hyperdynamic function. The left ventricle has no regional wall motion abnormalities. Left ventricular  diastolic parameters are indeterminate. 2. Right ventricular systolic function is normal. The right ventricular size is normal. There is normal pulmonary artery systolic pressure. The estimated right ventricular systolic pressure is 25.1 mmHg. 3. Left atrial size was mildly dilated. 4. Right atrial size was mildly dilated. 5. The mitral valve is grossly normal. Trivial mitral valve regurgitation. No evidence of mitral stenosis. 6. The aortic valve is tricuspid. There is mild calcification of the aortic valve. Aortic valve regurgitation is not visualized. 7. The inferior vena cava is dilated in size with >50% respiratory variability, suggesting right atrial pressure of 8 mmHg.   FINDINGS Left Ventricle: Left ventricular ejection fraction, by estimation, is 70 to 75%. The left ventricle has hyperdynamic function. The left ventricle has no regional wall motion abnormalities. The left ventricular internal cavity size was normal in size. There is no left ventricular hypertrophy. Left ventricular diastolic parameters are indeterminate.   Right Ventricle: The right ventricular size is normal. No increase in right ventricular wall thickness. Right ventricular systolic function is normal. There is normal pulmonary artery systolic pressure. The tricuspid regurgitant velocity is 2.35 m/s, and with an assumed right atrial pressure of 3 mmHg, the estimated right ventricular systolic pressure is 25.1 mmHg.   Left Atrium: Left atrial size was mildly dilated.   Right Atrium: Right atrial size was mildly dilated.   Pericardium: Trivial pericardial effusion is present. Presence of pericardial fat pad.   Mitral Valve: The mitral valve is grossly normal. Trivial mitral valve regurgitation. No evidence of mitral valve stenosis.   Tricuspid Valve: The tricuspid valve is grossly normal. Tricuspid valve regurgitation is mild . No evidence of tricuspid stenosis.   Aortic Valve: The aortic valve is tricuspid. There  is mild calcification of the aortic valve. Aortic valve regurgitation is not visualized.   Pulmonic Valve: The pulmonic valve was grossly normal. Pulmonic valve regurgitation is not visualized. No evidence of pulmonic stenosis.   Aorta: The aortic root and ascending aorta are structurally normal, with no evidence of dilitation.   Venous: The inferior vena cava is dilated in size with greater than 50% respiratory variability, suggesting right atrial pressure of 8 mmHg.   IAS/Shunts: The atrial septum is grossly normal.     LEFT VENTRICLE PLAX 2D LVIDd:         4.20 cm  Diastology LVIDs:         3.00 cm  LV e' medial:    7.94 cm/s LV PW:         1.00 cm  LV E/e' medial:  11.7 LV IVS:        0.70 cm  LV e' lateral:   6.53 cm/s LVOT diam:     1.90 cm  LV E/e' lateral: 14.2 LV SV:         78 LV SV Index:   42 LVOT Area:     2.84 cm   3D Volume EF: 3D EF:        74 % LV EDV:       96 ml LV ESV:       25 ml LV SV:        71 ml   RIGHT VENTRICLE RV S prime:     17.80 cm/s TAPSE (M-mode): 2.8 cm   LEFT ATRIUM             Index       RIGHT ATRIUM           Index LA diam:        4.20 cm 2.27 cm/m  RA Area:     18.70 cm LA Vol (A2C):   75.3 ml 40.65 ml/m RA Volume:   53.90 ml  29.10 ml/m LA Vol (A4C):   72.0 ml 38.87 ml/m LA Biplane Vol: 76.9 ml 41.52 ml/m AORTIC VALVE LVOT Vmax:   109.00 cm/s LVOT Vmean:  68.800 cm/s LVOT VTI:    0.275 m   AORTA Ao Root diam: 3.30 cm Ao Asc diam:  3.20 cm   MITRAL VALVE               TRICUSPID VALVE MV Area (PHT)  cm         TR Peak grad:   22.1 mmHg MV Decel Time: 235 msec    TR Vmax:        235.00 cm/s MV E velocity: 92.75 cm/s MV A velocity: 74.80 cm/s  SHUNTS MV E/A ratio:  1.24        Systemic VTI:  0.28 m Systemic Diam: 1.90 cm   Darryle Decent MD Electronically signed by Darryle Decent MD Signature Date/Time: 07/05/2020/11:08:41 AM       Final     CT SCANS   CT CORONARY MORPH W/CTA COR W/SCORE 10/26/2014   Addendum  10/26/2014  6:05 PM ADDENDUM REPORT: 10/26/2014 18:03   CLINICAL DATA:  78 year old female with alcoholic liver cirrhosis and equivocal stress echocardiogram. H/o PAD with carotid endarterectomy.   EXAM: Cardiac/Coronary  CT   TECHNIQUE: The patient was scanned on a Philips 256 scanner.   FINDINGS: A 120 kV prospective scan was triggered in the descending thoracic aorta at 111 HU's. Axial non-contrast 3 mm slices were carried out through the heart. The data set was analyzed on a dedicated work station and scored using the Agatson method. Gantry rotation speed was 270 msecs and collimation was .9 mm. No beta blockade and 0.4 mg of sl NTG was given. The 3D data set was reconstructed in 5% intervals of the 67-82 % of the R-R cycle. Diastolic phases were analyzed on a dedicated work station using MPR, MIP and VRT modes. The patient received 80 cc of contrast.   Aorta:  Normal caliber, no dissection.  Mild diffuse calcifications.   Aortic Valve:  Trileaflet, no calcifications.   Coronary Arteries: Normal coronary origin. There is right and left codominance.   RCA is a large vessel that gives rise to two acute marginal branches, a PDA and a very small PLA. There is a moderate calcified plaque in the proximal to mid RCA associated with 25-50% stenosis.   Left main is a large vessel that gives rise top LAD and LCX arteries.   Distal left main has a long calcified  plaque that extends into the proximal LAD and is associated with 25-50% stenosis.   LAD has a long moderate mixed predominantly calcified plaque in the proximal segment associated with 50-69% stenosis. Mid and distal LAD has only minimal plaque.   A fairly large diagonal branch, its proximal segment is affected by a cardiac motion, however that doesn't appear to be significant stenosis.   LCX gives rise to two obtuse marginal branches and a PLA. There is minimal plaque.   Other findings:  Normal pulmonary vein  drainage.   A large left atrial appendage has no filling defect.   IMPRESSION: 1. Coronary calcium score of 335. This was 62 percentile for age and sex matched control.   2. Normal coronary origin.  There is right and left co-dominance.   3. There is moderate non-obstructive CAD in the proximal LAD. Diffuse calcified plaque.   An aggressive risk factor modification is recommended.   Leim Moose     Electronically Signed By: Leim Moose On: 10/26/2014 18:03   Narrative EXAM: OVER-READ INTERPRETATION  CT CHEST   The following report is an over-read performed by radiologist Dr. Toribio Cove [facility] on 10/25/2014. This over-read does not include interpretation of cardiac or coronary anatomy or pathology. The coronary calcium score/coronary CTA interpretation by the cardiologist is attached.   COMPARISON:  No priors.   FINDINGS: 6 mm right lower lobe nodule (image 43 of series 204). 3 mm calcified granuloma in the left lower lobe. Within the visualized portions of the thorax there is no acute consolidative airspace disease, no pleural effusion, no pneumothorax and no lymphadenopathy. Numerous calcified mediastinal and left hilar lymph nodes are incidentally noted. Visualized portions of the upper abdomen demonstrate a very nodular liver contour, and hypertrophy of the caudate lobe, suggestive of underlying cirrhosis. There is an incompletely visualized 2.1 cm lesion in the posterior aspect of segment 2 of the liver (image 58 of series 504), which is not characterized. There are no aggressive appearing lytic or blastic lesions noted in the visualized portions of the skeleton.   IMPRESSION: 1. 6 mm right lower lobe pulmonary nodule. If the patient is at high risk for bronchogenic carcinoma, follow-up chest CT at 6-12 months is recommended. If the patient is at low risk for bronchogenic carcinoma, follow-up chest CT at 12 months is  recommended. This recommendation follows the consensus statement: Guidelines for Management of Small Pulmonary Nodules Detected on CT Scans: A Statement from the Fleischner Society as published in Radiology 2005;237:395-400. 2. Morphologic changes in the liver compatible with cirrhosis. Incompletely visualized and incompletely characterized 2.1 cm lesion in segment 2 of the liver. Further characterization with nonemergent MRI of the abdomen with and without IV gadolinium is recommended in the near future to better evaluate this finding.   Electronically Signed: By: Toribio Aye M.D. On: 10/25/2014 10:33      Physical Exam VS:  BP 130/70 (BP Location: Left Arm, Patient Position: Sitting, Cuff Size: Normal)   Pulse 70   Ht 5' 6 (1.676 m)   Wt 166 lb 12.8 oz (75.7 kg)   SpO2 96%   BMI 26.92 kg/m        Wt Readings from Last 3 Encounters:  01/27/24 166 lb 12.8 oz (75.7 kg)  01/20/24 166 lb (75.3 kg)  07/29/23 167 lb (75.8 kg)    GEN: Well nourished, well developed in no acute distress NECK: No JVD; + carotid bruits CARDIAC: RRR, no murmurs, rubs, gallops RESPIRATORY:  Clear  to auscultation without rales, wheezing or rhonchi  ABDOMEN: Soft, non-tender, non-distended EXTREMITIES:  No edema; No deformity   ASSESSMENT AND PLAN  Atherosclerotic cardiovascular disease Genetic predisposition from both maternal and paternal sides. Concerns about palpitations and weight gain. - Order echocardiogram to assess heart pump function and mitral valve status. - Schedule carotid ultrasound in January. - Continue current medications and lifestyle modifications.  Carotid artery stenosis 40-59% narrowing on one side with possible progression indicated by a bruit - Schedule carotid ultrasound in January.  Mitral valve regurgitation Trivial regurgitation noted in 2022 with concerns about progression. - Order echocardiogram to assess mitral valve regurgitation.  Hypercholesterolemia,  intolerant to Leqvio  Intolerant to Leqvio  due to breathing problems. Praluent  was effective but discontinued due to grant issues. - Cancel Leqvio  appointment. - Contact PharmD to explore grant options for Praluent  or Repatha.  Obesity/weight gain Weight gain despite regular exercise and healthy diet. Possible fluid retention due to cardiovascular issues. - Order echocardiogram to assess heart function and fluid retention. - Continue current medications until further evaluation.      Dispo: She can follow-up in 6 months with Dr. Santo  Signed, Orren LOISE Fabry, PA-C

## 2024-01-27 ENCOUNTER — Encounter: Payer: Self-pay | Admitting: Physician Assistant

## 2024-01-27 ENCOUNTER — Ambulatory Visit: Attending: Physician Assistant | Admitting: Physician Assistant

## 2024-01-27 VITALS — BP 130/70 | HR 70 | Ht 66.0 in | Wt 166.8 lb

## 2024-01-27 DIAGNOSIS — I2583 Coronary atherosclerosis due to lipid rich plaque: Secondary | ICD-10-CM | POA: Insufficient documentation

## 2024-01-27 DIAGNOSIS — I700 Atherosclerosis of aorta: Secondary | ICD-10-CM | POA: Diagnosis not present

## 2024-01-27 DIAGNOSIS — I2510 Atherosclerotic heart disease of native coronary artery without angina pectoris: Secondary | ICD-10-CM | POA: Diagnosis not present

## 2024-01-27 DIAGNOSIS — E7800 Pure hypercholesterolemia, unspecified: Secondary | ICD-10-CM | POA: Insufficient documentation

## 2024-01-27 DIAGNOSIS — R002 Palpitations: Secondary | ICD-10-CM | POA: Insufficient documentation

## 2024-01-27 DIAGNOSIS — Z720 Tobacco use: Secondary | ICD-10-CM | POA: Diagnosis not present

## 2024-01-27 NOTE — Patient Instructions (Signed)
 Medication Instructions:  Your physician recommends that you continue on your current medications as directed. Please refer to the Current Medication list given to you today. *If you need a refill on your cardiac medications before your next appointment, please call your pharmacy*  Lab Work: None ordered If you have labs (blood work) drawn today and your tests are completely normal, you will receive your results only by: MyChart Message (if you have MyChart) OR A paper copy in the mail If you have any lab test that is abnormal or we need to change your treatment, we will call you to review the results.  Testing/Procedures: Your physician has requested that you have a carotid duplex. This test is an ultrasound of the carotid arteries in your neck. It looks at blood flow through these arteries that supply the brain with blood. Allow one hour for this exam. There are no restrictions or special instructions.  Your physician has requested that you have an echocardiogram. Echocardiography is a painless test that uses sound waves to create images of your heart. It provides your doctor with information about the size and shape of your heart and how well your heart's chambers and valves are working. This procedure takes approximately one hour. There are no restrictions for this procedure. Please do NOT wear cologne, perfume, aftershave, or lotions (deodorant is allowed). Please arrive 15 minutes prior to your appointment time.  Please note: We ask at that you not bring children with you during ultrasound (echo/ vascular) testing. Due to room size and safety concerns, children are not allowed in the ultrasound rooms during exams. Our front office staff cannot provide observation of children in our lobby area while testing is being conducted. An adult accompanying a patient to their appointment will only be allowed in the ultrasound room at the discretion of the ultrasound technician under special  circumstances. We apologize for any inconvenience.   Follow-Up: At [facility], you and your health needs are our priority.  As part of our continuing mission to provide you with exceptional heart care, our providers are all part of one team.  This team includes your primary Cardiologist (physician) and Advanced Practice Providers or APPs (Physician Assistants and Nurse Practitioners) who all work together to provide you with the care you need, when you need it.  Your next appointment:   6 month(s)  Provider:   Stanly DELENA Leavens, MD   We recommend signing up for the patient portal called MyChart.  Sign up information is provided on this After Visit Summary.  MyChart is used to connect with patients for Virtual Visits (Telemedicine).  Patients are able to view lab/test results, encounter notes, upcoming appointments, etc.  Non-urgent messages can be sent to your provider as well.   To learn more about what you can do with MyChart, go to [email or web address].   Other Instructions Zada Palin, NP for PCP in [place], [place] Phone: [phone]

## 2024-01-28 ENCOUNTER — Ambulatory Visit

## 2024-01-28 ENCOUNTER — Other Ambulatory Visit (HOSPITAL_COMMUNITY): Payer: Self-pay

## 2024-01-28 ENCOUNTER — Encounter: Payer: Self-pay | Admitting: Internal Medicine

## 2024-01-28 ENCOUNTER — Telehealth: Payer: Self-pay | Admitting: Pharmacy Technician

## 2024-01-28 NOTE — Telephone Encounter (Signed)
 You are welcome! [facility] and ready for you to send in the prescription for the repatha. Thank you!

## 2024-01-28 NOTE — Telephone Encounter (Signed)
   Pharmacy Patient Advocate Encounter   Received notification from Pt Calls Messages that prior authorization for repatha is required/requested.   Insurance verification completed.   The patient is insured through [facility].   Per test claim: PA required; PA submitted to above mentioned insurance via Latent Key/confirmation #/EOC [facility] Status is pending    Tried repatha first since notes said she had some problems with praluent  per 09/08/22 note-Has been on praluent  for 3 years, no issues until last month.  Patient Advocate Encounter   The patient was approved for a [employer] grant that will help cover the cost of repatha/praluent  Total amount awarded, 2500.  Effective: 12/29/23 - 12/27/24   APW:[id number] ERW:[id number] Hmnle:[id number] PI:[id number] [employer] ID: [id number]   Pharmacy provided with approval and processing information. Patient informed via mychart

## 2024-01-28 NOTE — Telephone Encounter (Signed)
 Pharmacy Patient Advocate Encounter  Received notification from [facility] that Prior Authorization for repatha has been APPROVED from 01/28/24 to 04/06/2098   PA #/Case ID/Reference #: [id number]     The patient was approved for a [employer] grant that will help cover the cost of repatha Total amount awarded, 2500.  Effective: 12/29/23 - 12/27/24   APW:[id number] ERW:[id number] Hmnle:[id number] PI:[id number] [employer] ID: [id number]  Pharmacy provided with approval and processing information. Patient informed via mychart

## 2024-01-29 NOTE — Progress Notes (Signed)
 Allergen panel negative. CBC normal.

## 2024-02-01 NOTE — Progress Notes (Signed)
 Called and spoke to pt - advised of lab results per Izetta Rouleau, NP. Pt verbalized understanding, NFN

## 2024-02-02 ENCOUNTER — Other Ambulatory Visit: Payer: Self-pay | Admitting: Physician Assistant

## 2024-02-02 MED ORDER — REPATHA SURECLICK 140 MG/ML ~~LOC~~ SOAJ: 140.0000 mg | SUBCUTANEOUS | 12 refills | Status: AC

## 2024-02-02 NOTE — Telephone Encounter (Signed)
 [facility] and [facility] on [facility] and grant and now 0.00

## 2024-02-02 NOTE — Progress Notes (Signed)
 [facility]   Orren LOISE Fabry, PA-C

## 2024-02-05 ENCOUNTER — Other Ambulatory Visit: Payer: Self-pay | Admitting: Gastroenterology

## 2024-02-05 DIAGNOSIS — K7460 Unspecified cirrhosis of liver: Secondary | ICD-10-CM

## 2024-02-05 DIAGNOSIS — R1900 Intra-abdominal and pelvic swelling, mass and lump, unspecified site: Secondary | ICD-10-CM | POA: Diagnosis not present

## 2024-02-05 DIAGNOSIS — R1906 Epigastric swelling, mass or lump: Secondary | ICD-10-CM

## 2024-02-10 ENCOUNTER — Ambulatory Visit
Admission: RE | Admit: 2024-02-10 | Discharge: 2024-02-10 | Disposition: A | Discharge status: Home / Self Care | Source: Ambulatory Visit | Attending: Gastroenterology | Admitting: Gastroenterology

## 2024-02-10 DIAGNOSIS — R1906 Epigastric swelling, mass or lump: Secondary | ICD-10-CM

## 2024-02-10 DIAGNOSIS — K7460 Unspecified cirrhosis of liver: Secondary | ICD-10-CM

## 2024-02-10 MED ORDER — IOHEXOL 300 MG/ML  SOLN
100.0000 mL | Freq: Once | INTRAMUSCULAR | Status: AC | PRN
  Administered 2024-02-10: 100 mL via INTRAVENOUS

## 2024-02-11 ENCOUNTER — Ambulatory Visit (INDEPENDENT_AMBULATORY_CARE_PROVIDER_SITE_OTHER): Admitting: Nurse Practitioner

## 2024-02-11 ENCOUNTER — Encounter: Payer: Self-pay | Admitting: Nurse Practitioner

## 2024-02-11 VITALS — BP 145/84 | HR 69 | Ht 66.0 in | Wt 167.8 lb

## 2024-02-11 DIAGNOSIS — F17210 Nicotine dependence, cigarettes, uncomplicated: Secondary | ICD-10-CM

## 2024-02-11 DIAGNOSIS — Z720 Tobacco use: Secondary | ICD-10-CM

## 2024-02-11 DIAGNOSIS — J45909 Unspecified asthma, uncomplicated: Secondary | ICD-10-CM

## 2024-02-11 DIAGNOSIS — J4530 Mild persistent asthma, uncomplicated: Secondary | ICD-10-CM

## 2024-02-11 MED ORDER — LEVALBUTEROL TARTRATE 45 MCG/ACT IN AERO: 2.0000 | INHALATION_SPRAY | Freq: Four times a day (QID) | RESPIRATORY_TRACT | Status: DC | PRN

## 2024-02-11 NOTE — Progress Notes (Signed)
 @Patient  ID: Nicole Barr, female    DOB: July 12, 1945, 78 y.o.   MRN: [id number]  Chief Complaint  Patient presents with   Medical Management of Chronic Issues    PT states she's very thankful towards you and doing so much better.     Referring provider: Leonel Cole, MD  HPI: 78 year old female, some day smoker followed for asthma. She is a patient of [name] and last seen in office  01/20/2024 by [facility] NP. Past medical history significant for CAD, mitral valve regurgitation, cirrhosis, hx of ETOH abuse.   TEST/EVENTS:  02/11/2022 CXR: RML infiltrate  07/05/2020 echo: EF 70-75%, LVH. RV size and function nl. Normal RVSP. LA and RA mildly dilated. Trivial MR.  07/02/2022 PFT: FVC 101, FEV1 104, ratio 77, TLC 117, DLCOcor 80. No BD 01/20/2024 CXR: no acute process; lungs clear  06/30/2023: OV with Dr. Kara. Persistent cough with mucus production for approx 2 months. Initial tx with doxycycline ineffective. Subsequent course of augmentin with some improvement after 5 days but not full resolved. Difficulty with breathing. Albuterol not helpful. Adverse reaction to Advair with swelling and airway closure. Still smoking. Prescribed Incruse. Sputum culture ordered. Smoking cessation.   01/20/2024: SHERLEAN with Detron Carras NP [name] is a 78 year old female who presents for acute visit.  An episode began last Saturday when she thinks she may have inhaled something at the gym, she's not sure what, and woke up Sunday with a scratchy throat. She gargled with salt water, but symptoms worsened with increased mucus production and chest tightness. She experiences constant coughing and has not slept very well for two days because of it. The mucus is described as milky white. She took Robitussin DM with guaifenesin to help expel mucus. No fevers, chills, hemoptysis, CP, leg swelling. Does feel like her sinuses are draining some but no colored mucus. No ear pain, headaches, or body aches. No known  sick exposures.  She has a history of adverse reactions to steroids, specifically prednisone, which she believes increased her mucus production. She also reports a poor response to doxycycline in the past.  She smokes when frustrated but has not smoked for four weeks. She does not really use her albuterol. Not on any maintenance inhaler due to prior side effects. She is very sensitive to medications, per her report.   02/11/2024: Today - follow up Discussed the use of AI scribe software for clinical note transcription with the patient, who gave verbal consent to proceed.  History of Present Illness  Nicole Barr is a 78 year old female who presents for follow up.  She had experienced significant respiratory issues over the past two years, affecting her entire body and brain, leading to fatigue and hopelessness. She had acute worsening of symptoms last month and was treated here with z pack, prednisone, and cough control measures. She noted a marked improvement in her breathing, being able to take deep breaths.   She uses albuterol but experiences adverse effects such as jitteriness and possibly increased blood pressure, leading her to avoid daily use. She does feel like it helps open her chest.   She is concerned about leaving infections untreated as they can accumulate over time.  She has a history of smoking, which complicates clearing respiratory issues. However, she has reduced smoking due to decreased stress and has returned to the gym after a month-long hiatus. She now consistently wears a mask and practices good hygiene to prevent future infections.  Her diet consists mostly of leafy greens, berries, and fish, while avoiding high-sugar fruits and meat, except for chicken and fish. She ensures adequate protein intake through fish, plant-based sources like edamame and tofu.  She received a flu shot. Has not gotten her RSV vaccine.  She was unable to tolerate the Incruse  previously prescribed. Not on a daily inhaler. She was also unable to tolerate Advair in the past.   No fevers, chills, hemoptysis, weight loss, anorexia, night sweats.     Allergies  Allergen Reactions   Advair Hfa [Fluticasone -Salmeterol] Anaphylaxis   Carvedilol Other (See Comments)    Hypertension (intolerance), Rapid Heart Rate Increases BP to 300 - per pt    Doxepin Hcl     Other reaction(s): does not do anything for her   Doxycycline Other (See Comments)    Not specified   Trazodone Hcl     Other reaction(s): in effective for sleep (2016)    Immunization History  Administered Date(s) Administered   PFIZER(Purple Top)SARS-COV-2 Vaccination 12/06/2019   Pfizer Covid-19 Vaccine Bivalent Booster 64yrs & up 12/27/2020    Past Medical History:  Diagnosis Date   Alcoholic cirrhosis (HCC)    Ascites    Coronary artery disease    Endometriosis    GERD (gastroesophageal reflux disease)    Hypertension    Liver cyst     Tobacco History: Social History   Tobacco Use  Smoking Status Some Days   Current packs/day: 0.50   Average packs/day: 0.5 packs/day for 55.8 years (27.9 ttl pk-yrs)   Types: Cigarettes   Start date: 04/17/1968  Smokeless Tobacco Never  Tobacco Comments   HAS NOT SMOKED FOR 4 WEEKS 01/20/24   Ready to quit: Not Answered Counseling given: Not Answered Tobacco comments: HAS NOT SMOKED FOR 4 WEEKS 01/20/24   Outpatient Medications Prior to Visit  Medication Sig Dispense Refill   Alirocumab  (PRALUENT ) 150 MG/ML SOAJ as directed Subcutaneous     benzonatate  (TESSALON ) 200 MG capsule Take 1 capsule (200 mg total) by mouth 3 (three) times daily as needed for cough. 30 capsule 1   Cholecalciferol 25 MCG (1000 UT) tablet Take 1,000 Units by mouth See admin instructions. Every other day     Evolocumab (REPATHA SURECLICK) 140 MG/ML SOAJ Inject 140 mg into the skin every 14 (fourteen) days. 2 mL 12   furosemide  (LASIX ) 20 MG tablet Take 20 mg by mouth  daily.     lactulose  (CHRONULAC ) 10 GM/15ML solution 3-4 tablespoons daily by mouth     nadolol  (CORGARD ) 20 MG tablet Take 1 tablet (20 mg total) by mouth daily. 90 tablet 3   predniSONE (DELTASONE) 10 MG tablet 3 tabs for 2 days, 2 tabs for 2 days, then 1 tab for 2 days, then stop 12 tablet 0   promethazine -dextromethorphan (PROMETHAZINE -DM) 6.25-15 MG/5ML syrup Take 5 mLs by mouth 4 (four) times daily as needed for cough. 180 mL 0   rifaximin  (XIFAXAN ) 550 MG TABS tablet Take 550 mg by mouth 2 (two) times daily.     spironolactone  (ALDACTONE ) 50 MG tablet Take 50 mg by mouth every morning.     albuterol (VENTOLIN HFA) 108 (90 Base) MCG/ACT inhaler Inhale 1-2 puffs into the lungs every 6 (six) hours as needed for wheezing or shortness of breath.     gabapentin  (NEURONTIN ) 100 MG capsule Take 1 capsule (100 mg total) by mouth at bedtime. 90 capsule 0   inclisiran (LEQVIO ) 284 MG/1.5ML SOSY injection Inject 284 mg into the  skin every 6 (six) months.     No facility-administered medications prior to visit.     Review of Systems: as above    Physical Exam:  BP (!) 145/84   Pulse 69   Ht 5' 6 (1.676 m) Comment: per pt  Wt 167 lb 12.8 oz (76.1 kg)   SpO2 99%   BMI 27.08 kg/m   GEN: Pleasant, interactive, acutely-ill appearing; in no acute distress. HEENT:  Normocephalic and atraumatic. PERRLA. Sclera white. Nasal turbinates pink, moist and patent bilaterally. No rhinorrhea present. Oropharynx pink and moist, without exudate or edema. No lesions, ulcerations, or postnasal drip.  NECK:  Supple w/ fair ROM. No JVD present. No lymphadenopathy.   CV: RRR, no m/r/g, no peripheral edema. Pulses intact, +2 bilaterally. No cyanosis, pallor or clubbing. PULMONARY:  Unlabored, regular breathing.Clear bilaterally A&P w/o wheezes/rales/rhonchi. No accessory muscle use.  GI: BS present and normoactive. Soft, non-tender to palpation. MSK: No erythema, warmth or tenderness. Cap refil <2 sec all  extrem.  Neuro: A/Ox3. No focal deficits noted.   Skin: Warm, no lesions or rashe Psych: Normal affect and behavior. Judgement and thought content appropriate.     Lab Results:  CBC    Component Value Date/Time   WBC 4.4 01/20/2024 1521   RBC 4.37 01/20/2024 1521   HGB 13.2 01/20/2024 1521   HGB 13.0 05/13/2019 0923   HCT 38.2 01/20/2024 1521   HCT 37.8 05/13/2019 0923   PLT 235.0 01/20/2024 1521   PLT 227 05/13/2019 0923   MCV 87.3 01/20/2024 1521   MCV 90 05/13/2019 0923   MCH 29.9 06/06/2023 0556   MCHC 34.5 01/20/2024 1521   RDW 13.2 01/20/2024 1521   RDW 12.1 05/13/2019 0923   LYMPHSABS 1.4 01/20/2024 1521   LYMPHSABS 1.4 04/21/2018 0734   MONOABS 0.6 01/20/2024 1521   EOSABS 0.2 01/20/2024 1521   EOSABS 0.2 04/21/2018 0734   BASOSABS 0.0 01/20/2024 1521   BASOSABS 0.0 04/21/2018 0734    BMET    Component Value Date/Time   NA 138 06/06/2023 0556   NA 141 05/13/2019 0923   K 3.7 06/06/2023 0556   CL 103 06/06/2023 0556   CO2 25 06/06/2023 0556   GLUCOSE 120 (H) 06/06/2023 0556   BUN 16 06/06/2023 0556   BUN 22 05/13/2019 0923   CREATININE 0.87 06/06/2023 0556   CREATININE 0.99 10/29/2015 0820   CALCIUM 9.6 06/06/2023 0556   GFRNONAA >60 06/06/2023 0556   GFRAA 43 (L) 05/13/2019 0923    BNP No results found for: BNP   Imaging:  DG Chest 2 View Result Date: 01/20/2024 CLINICAL DATA:  productive cough; acute bronchitis EXAM: DG CHEST 2V COMPARISON:  June 06, 2023 FINDINGS: No focal airspace consolidation, pleural effusion, or pneumothorax. No cardiomegaly. Aortic atherosclerosis. No acute fracture or destructive lesions. Multilevel thoracic osteophytosis. IMPRESSION: No acute cardiopulmonary abnormality. Electronically Signed   By: Rogelia Myers M.D.   On: 01/20/2024 15:35    ipratropium-albuterol (DUONEB) 0.5-2.5 (3) MG/3ML nebulizer solution 3 mL     Date Action Dose Route User   01/20/2024 1425 Given 3 mL Nebulization Heater, Megan S, CMA       methylPREDNISolone  acetate (DEPO-MEDROL ) injection 80 mg     Date Action Dose Route User   01/20/2024 1515 Given 80 mg Intramuscular (Left Upper Outer Quadrant) Heater, Duwaine RAMAN, CMA          Latest Ref Rng & Units 07/02/2022    8:55 AM  PFT Results  FVC-Pre  L 3.16   FVC-Predicted Pre % 101   FVC-Post L 3.07   FVC-Predicted Post % 98   Pre FEV1/FVC % % 78   Post FEV1/FCV % % 77   FEV1-Pre L 2.47   FEV1-Predicted Pre % 104   FEV1-Post L 2.36   DLCO uncorrected ml/min/mmHg 16.72   DLCO UNC% % 80   DLCO corrected ml/min/mmHg 16.72   DLCO COR %Predicted % 80   DLVA Predicted % 77   TLC L 6.38   TLC % Predicted % 117   RV % Predicted % 131     No results found for: NITRICOXIDE      Assessment & Plan:   Reactive airway disease Recent acute bronchitis, resolved. Clinically improved. Intolerant of ICS/LABA and LAMA therapy in past. Did well with duoneb in office. Consider in future. For now, she has declined maintenance therapies. Understands risks of not being on controller therapies including exacerbations/hospitalizations. She does have some side effects with albuterol. Will switch her to levalbuterol to limit cardiac effects/jitteriness. Advised to continue PRN SABA at home.  No significant environmental allergies on prior testing. No formal obstruction on prior PFT. Smoking cessation strongly advised. Graded exercise encouraged. Action plan in place.  Patient Instructions  Switch albuterol to levalbuterol inhaler 2 puffs every 6 hours as needed for shortness of breath or wheezing. Notify if symptoms persist despite rescue inhaler/neb use.     Follow up in 6 months with Dr. Kara or Izetta Malachy PIETY. If symptoms do not improve or worsen, please contact office for sooner follow up or seek emergency care.    Tobacco use Active some day smoker. Referred to lung cancer screening program with 27 pack year hx.      I spent 32 minutes of dedicated to the care of  this patient on the date of this encounter to include pre-visit review of records, face-to-face time with the patient discussing conditions above, post visit ordering of testing, clinical documentation with the electronic health record, making appropriate referrals as documented, and communicating necessary findings to members of the patients care team.  Comer LULLA Malachy, NP 02/11/2024  Pt aware and understands NP's role.

## 2024-02-11 NOTE — Assessment & Plan Note (Signed)
 Active some day smoker. Referred to lung cancer screening program with 27 pack year hx.

## 2024-02-11 NOTE — Patient Instructions (Addendum)
 Switch albuterol to levalbuterol inhaler 2 puffs every 6 hours as needed for shortness of breath or wheezing. Notify if symptoms persist despite rescue inhaler/neb use.     Follow up in 6 months with Dr. Kara or Izetta Malachy PIETY. If symptoms do not improve or worsen, please contact office for sooner follow up or seek emergency care.

## 2024-02-11 NOTE — Assessment & Plan Note (Signed)
 Recent acute bronchitis, resolved. Clinically improved. Intolerant of ICS/LABA and LAMA therapy in past. Did well with duoneb in office. Consider in future. For now, she has declined maintenance therapies. Understands risks of not being on controller therapies including exacerbations/hospitalizations. She does have some side effects with albuterol. Will switch her to levalbuterol to limit cardiac effects/jitteriness. Advised to continue PRN SABA at home.  No significant environmental allergies on prior testing. No formal obstruction on prior PFT. Smoking cessation strongly advised. Graded exercise encouraged. Action plan in place.  Patient Instructions  Switch albuterol to levalbuterol inhaler 2 puffs every 6 hours as needed for shortness of breath or wheezing. Notify if symptoms persist despite rescue inhaler/neb use.     Follow up in 6 months with Dr. Kara or Nicole Barr. If symptoms do not improve or worsen, please contact office for sooner follow up or seek emergency care.

## 2024-02-12 ENCOUNTER — Other Ambulatory Visit: Payer: Self-pay | Admitting: Nurse Practitioner

## 2024-02-12 ENCOUNTER — Telehealth: Payer: Self-pay | Admitting: *Deleted

## 2024-02-12 DIAGNOSIS — J4530 Mild persistent asthma, uncomplicated: Secondary | ICD-10-CM

## 2024-02-12 MED ORDER — LEVALBUTEROL TARTRATE 45 MCG/ACT IN AERO: 2.0000 | INHALATION_SPRAY | Freq: Four times a day (QID) | RESPIRATORY_TRACT | 5 refills | Status: AC | PRN

## 2024-02-12 NOTE — Telephone Encounter (Signed)
 Copied from CRM [id number]. Topic: Clinical - Prescription Issue >> Feb 12, 2024  9:25 AM Benton KIDD wrote: Reason for CRM: patient is calling because np comer cobb was suppose to send a prescription for a inhaler and when patient went to the pharmacy they said the doctor needs to resend cause they didn't receive it   [id number] [place], [place] - [address] AT [address] OF [facility] [address] [place] [address] Phone: [phone] Fax: [phone] Hours: Not open 24 hours  Levalbuterol sent on 11/7 was not received by pharmacy. This has been resent. Patient notified.

## 2024-02-16 ENCOUNTER — Other Ambulatory Visit (HOSPITAL_COMMUNITY): Payer: Self-pay

## 2024-02-16 ENCOUNTER — Telehealth: Payer: Self-pay

## 2024-02-16 NOTE — Telephone Encounter (Signed)
*  Pulm  Pharmacy Patient Advocate Encounter   Received notification from RX Request Messages that prior authorization for Levalbuterol HFA is required/requested.   Insurance verification completed.   The patient is insured through [facility].   Per test claim: PA required; PA submitted to above mentioned insurance via Latent Key/confirmation #/EOC [id number] Status is pending

## 2024-02-16 NOTE — Telephone Encounter (Signed)
 Pharm team- she was jittery/heart racing with albuterol so we changed to levalbuterol. Can you please try for PA?

## 2024-02-16 NOTE — Telephone Encounter (Signed)
 Your request has been approved Approved. This drug has been approved under the Member's Medicare Part D benefit. Approved quantity: 15 units per 25 day(s). You may fill up to a 90 day supply except for those on Specialty Tier 5, which can be filled up to a 30 day supply. Please call the pharmacy to process the prescription claim. Authorization Expiration12/31/2099

## 2024-02-18 ENCOUNTER — Telehealth: Payer: Self-pay

## 2024-02-18 ENCOUNTER — Other Ambulatory Visit: Payer: Self-pay | Admitting: Gastroenterology

## 2024-02-18 DIAGNOSIS — R9389 Abnormal findings on diagnostic imaging of other specified body structures: Secondary | ICD-10-CM

## 2024-02-18 NOTE — Telephone Encounter (Signed)
 Copied from CRM [id number]. Topic: Clinical - Medication Question >> Feb 18, 2024  1:24 PM Leila C wrote: Reason for CRM: Patient [phone].states NP, Cobb prescribed a new inhaler and needs the name of the inhaler since her insurance does not cover it. Patient is asking if another medication be prescribe, that will be covered by insurance? Patient has questions and needs clarification. Please advise, patient has an appointment today, please call back before 4:30 pm or tomorrow.

## 2024-03-02 ENCOUNTER — Ambulatory Visit
Admission: RE | Admit: 2024-03-02 | Discharge: 2024-03-02 | Disposition: A | Source: Ambulatory Visit | Attending: Gastroenterology | Admitting: Gastroenterology

## 2024-03-02 DIAGNOSIS — R9389 Abnormal findings on diagnostic imaging of other specified body structures: Secondary | ICD-10-CM

## 2024-03-02 MED ORDER — GADOPICLENOL 0.5 MMOL/ML IV SOLN
8.0000 mL | Freq: Once | INTRAVENOUS | Status: AC | PRN
  Administered 2024-03-02: 8 mL via INTRAVENOUS

## 2024-03-07 DIAGNOSIS — Z789 Other specified health status: Secondary | ICD-10-CM | POA: Diagnosis not present

## 2024-03-07 DIAGNOSIS — L538 Other specified erythematous conditions: Secondary | ICD-10-CM | POA: Diagnosis not present

## 2024-03-07 DIAGNOSIS — L820 Inflamed seborrheic keratosis: Secondary | ICD-10-CM | POA: Diagnosis not present

## 2024-03-07 DIAGNOSIS — L2989 Other pruritus: Secondary | ICD-10-CM | POA: Diagnosis not present

## 2024-03-15 ENCOUNTER — Ambulatory Visit (HOSPITAL_COMMUNITY)
Admission: RE | Admit: 2024-03-15 | Discharge: 2024-03-15 | Disposition: A | Source: Ambulatory Visit | Attending: Physician Assistant | Admitting: Physician Assistant

## 2024-03-15 DIAGNOSIS — I2510 Atherosclerotic heart disease of native coronary artery without angina pectoris: Secondary | ICD-10-CM

## 2024-03-15 DIAGNOSIS — E7800 Pure hypercholesterolemia, unspecified: Secondary | ICD-10-CM

## 2024-03-15 DIAGNOSIS — Z720 Tobacco use: Secondary | ICD-10-CM

## 2024-03-15 DIAGNOSIS — R002 Palpitations: Secondary | ICD-10-CM | POA: Diagnosis not present

## 2024-03-15 DIAGNOSIS — I700 Atherosclerosis of aorta: Secondary | ICD-10-CM

## 2024-03-15 DIAGNOSIS — I2583 Coronary atherosclerosis due to lipid rich plaque: Secondary | ICD-10-CM | POA: Diagnosis not present

## 2024-03-15 LAB — ECHOCARDIOGRAM COMPLETE
Area-P 1/2: 3.31 cm2
S' Lateral: 2.4 cm

## 2024-03-17 ENCOUNTER — Ambulatory Visit: Payer: Self-pay | Admitting: Physician Assistant

## 2024-04-13 ENCOUNTER — Ambulatory Visit (HOSPITAL_COMMUNITY)
Admission: RE | Admit: 2024-04-13 | Discharge: 2024-04-13 | Disposition: A | Discharge status: Home / Self Care | Source: Ambulatory Visit | Attending: Physician Assistant | Admitting: Physician Assistant

## 2024-04-13 DIAGNOSIS — I2583 Coronary atherosclerosis due to lipid rich plaque: Secondary | ICD-10-CM | POA: Diagnosis present

## 2024-04-13 DIAGNOSIS — I700 Atherosclerosis of aorta: Secondary | ICD-10-CM | POA: Diagnosis present

## 2024-04-13 DIAGNOSIS — E7800 Pure hypercholesterolemia, unspecified: Secondary | ICD-10-CM | POA: Diagnosis present

## 2024-04-13 DIAGNOSIS — R002 Palpitations: Secondary | ICD-10-CM | POA: Diagnosis present

## 2024-04-13 DIAGNOSIS — I2510 Atherosclerotic heart disease of native coronary artery without angina pectoris: Secondary | ICD-10-CM | POA: Insufficient documentation

## 2024-04-13 DIAGNOSIS — Z720 Tobacco use: Secondary | ICD-10-CM | POA: Insufficient documentation

## 2024-04-13 DIAGNOSIS — I6522 Occlusion and stenosis of left carotid artery: Secondary | ICD-10-CM | POA: Diagnosis present

## 2024-04-15 ENCOUNTER — Telehealth: Payer: Self-pay

## 2024-04-15 NOTE — Telephone Encounter (Signed)
 Auth Submission: NO AUTH NEEDED Site of care: Site of care: [facility] Payer: Medicare A/B with AARP supplement Medication & CPT/J Code(s) submitted: Leqvio  (Inclisiran) J1306 Diagnosis Code:  Route of submission (phone, fax, portal):  Phone # Fax # Auth type: Buy/Bill PB Units/visits requested: 284mg  x 2 doses Reference number:  Approval from: 04/15/24 to 05/07/25
